# Patient Record
Sex: Male | Born: 1985 | Race: Black or African American | Hispanic: No | Marital: Single | Smoking: Current some day smoker
Health system: Southern US, Community
[De-identification: ages and names within clinical notes are randomized; demographics above are authoritative.]

## PROBLEM LIST (undated history)

## (undated) DIAGNOSIS — F209 Schizophrenia, unspecified: Secondary | ICD-10-CM

## (undated) HISTORY — PX: NO PAST SURGERIES: SHX2092

---

## 2018-10-13 ENCOUNTER — Emergency Department
Admission: EM | Admit: 2018-10-13 | Discharge: 2018-10-13 | Disposition: A | Discharge status: Home / Self Care | Payer: Self-pay | Attending: Emergency Medicine | Admitting: Emergency Medicine

## 2018-10-13 ENCOUNTER — Other Ambulatory Visit: Payer: Self-pay

## 2018-10-13 DIAGNOSIS — F17210 Nicotine dependence, cigarettes, uncomplicated: Secondary | ICD-10-CM | POA: Insufficient documentation

## 2018-10-13 DIAGNOSIS — Y929 Unspecified place or not applicable: Secondary | ICD-10-CM | POA: Insufficient documentation

## 2018-10-13 DIAGNOSIS — Y93H2 Activity, gardening and landscaping: Secondary | ICD-10-CM | POA: Insufficient documentation

## 2018-10-13 DIAGNOSIS — D171 Benign lipomatous neoplasm of skin and subcutaneous tissue of trunk: Secondary | ICD-10-CM | POA: Insufficient documentation

## 2018-10-13 DIAGNOSIS — Y999 Unspecified external cause status: Secondary | ICD-10-CM | POA: Insufficient documentation

## 2018-10-13 DIAGNOSIS — X503XXA Overexertion from repetitive movements, initial encounter: Secondary | ICD-10-CM | POA: Insufficient documentation

## 2018-10-13 DIAGNOSIS — S239XXA Sprain of unspecified parts of thorax, initial encounter: Secondary | ICD-10-CM | POA: Insufficient documentation

## 2018-10-13 MED ORDER — NAPROXEN 500 MG PO TABS: 500.0000 mg | ORAL_TABLET | Freq: Two times a day (BID) | ORAL | 0 refills | Status: AC

## 2018-10-13 MED ORDER — CYCLOBENZAPRINE HCL 5 MG PO TABS: 5.0000 mg | ORAL_TABLET | Freq: Three times a day (TID) | ORAL | 0 refills | Status: DC | PRN

## 2018-10-13 MED ORDER — LIDOCAINE 5 % EX PTCH: 1.0000 | MEDICATED_PATCH | CUTANEOUS | 0 refills | Status: AC

## 2018-10-13 NOTE — ED Triage Notes (Signed)
Pt c/o mid back pain for the past couple of days, states he thinks he strained it while cutting weeds for his mother.

## 2018-10-13 NOTE — ED Notes (Signed)
See triage note  Presents with min back pain  States he was working in the yard on Sunday   And developed pain

## 2018-10-13 NOTE — ED Provider Notes (Signed)
[facility] Emergency Department Provider Note ____________________________________________  Time seen: 1646  I have reviewed the triage vital signs and the nursing notes.  HISTORY  Chief Complaint  Back Pain  HPI Ethan Sutton is a 33 y.o. male presents to the ED for evaluation of mid back pain.   Patient reports onset a couple days earlier, and reports he may have strained it while cutting weeds.  He admits that he was swinging a weedeater when the pain started suddenly. He localizes the pain to the right midback region. Pain is worsened by movement of the arms. He denies any chest pain, shortness of breath, diaphoresis, or syncope.  He has also had no nausea, vomiting, or diarrhea.  History reviewed. No pertinent past medical history.  There are no active problems to display for this patient.  History reviewed. No pertinent surgical history.  Prior to Admission medications   Medication Sig Start Date End Date Taking? Authorizing Provider  cyclobenzaprine (FLEXERIL) 5 MG tablet Take 1 tablet (5 mg total) by mouth 3 (three) times daily as needed. 10/13/18   Analis Distler, Dannielle Karvonen, PA-C  lidocaine (LIDODERM) 5 % Place 1 patch onto the skin daily for 5 days. Remove & Discard patch after 12 hours of wear each day. 10/13/18 10/18/18  Kenneshia Rehm, Dannielle Karvonen, PA-C  naproxen (NAPROSYN) 500 MG tablet Take 1 tablet (500 mg total) by mouth 2 (two) times daily with a meal for 15 days. 10/13/18 10/28/18  Marieelena Bartko, Dannielle Karvonen, PA-C   Allergies Patient has no known allergies.  No family history on file.  Social History Social History   Tobacco Use  . Smoking status: Current Every Day Smoker    Types: Cigarettes  . Smokeless tobacco: Never Used  Substance Use Topics  . Alcohol use: Not Currently  . Drug use: Not Currently    Review of Systems  Constitutional: Negative for fever. Eyes: Negative for visual changes. ENT: Negative for sore throat. Cardiovascular:  Negative for chest pain. Respiratory: Negative for shortness of breath. Gastrointestinal: Negative for abdominal pain, vomiting and diarrhea. Genitourinary: Negative for dysuria. Musculoskeletal: Positive for mid back pain. Skin: Negative for rash. Neurological: Negative for headaches, focal weakness or numbness. ____________________________________________  PHYSICAL EXAM:  VITAL SIGNS: ED Triage Vitals  Enc Vitals Group     BP 10/13/18 1553 118/81     Pulse Rate 10/13/18 1553 78     Resp 10/13/18 1553 16     Temp 10/13/18 1553 98.7 F (37.1 C)     Temp src --      SpO2 10/13/18 1553 99 %     Weight 10/13/18 1554 180 lb (81.6 kg)     Height 10/13/18 1554 6' (1.829 m)     Head Circumference --      Peak Flow --      Pain Score 10/13/18 1554 8     Pain Loc --      Pain Edu? --      Excl. in [place]? --    Constitutional: Alert and oriented. Well appearing and in no distress. Head: Normocephalic and atraumatic. Eyes: Conjunctivae are normal. Normal extraocular movements Neck: Supple. Normal ROM Cardiovascular: Normal rate, regular rhythm. Normal distal pulses. Respiratory: Normal respiratory effort. No wheezes/rales/rhonchi. Gastrointestinal: Soft and nontender. No distention. Musculoskeletal: Normal spinal alignment without midline tenderness, spasm, deformity, or step-off.  Normal flexion extension range of the spine noted.  Patient with a large, mobile, smooth, rubbery subcutaneous, soft tissue mass; most consistent with  a stable lipoma; to the right thoracic region overlying the area of concern. Nontender with normal range of motion in all extremities.  Neurologic:  Normal gait without ataxia. Normal speech and language. No gross focal neurologic deficits are appreciated. Skin:  Skin is warm, dry and intact. No rash noted. No indication of focal cellulitis or infected lipoma/cyst.  Psychiatric: Mood and affect are normal. Patient exhibits appropriate insight and  judgment. ____________________________________________  PROCEDURES  Procedures ____________________________________________  INITIAL IMPRESSION / ASSESSMENT AND PLAN / ED COURSE  Patient with ED evaluation of several days of right-sided mid back pain.  Patient reports onset after mechanical injury.  His exam is overall benign reassuring at this time.  Notification of any acute intracranial process.  This likely represent a musculoskeletal sprain.  Patient will be treated empirically with anti-inflammatories and muscle relaxants.  He is also given a prescription for topical pain patches.  Follow with primary provider or one of the local community clinics.  Janet Szwed was evaluated in Emergency Department on 10/13/2018 for the symptoms described in the history of present illness. He was evaluated in the context of the global COVID-19 pandemic, which necessitated consideration that the patient might be at risk for infection with the SARS-CoV-2 virus that causes COVID-19. Institutional protocols and algorithms that pertain to the evaluation of patients at risk for COVID-19 are in a state of rapid change based on information released by regulatory bodies including the CDC and federal and state organizations. These policies and algorithms were followed during the patient's care in the ED. ____________________________________________  FINAL CLINICAL IMPRESSION(S) / ED DIAGNOSES  Final diagnoses:  Thoracic back sprain, initial encounter  Lipoma of back      Melvenia Needles, PA-C 10/13/18 1747    Vanessa [place], MD 10/15/18 [phone]

## 2018-10-13 NOTE — Discharge Instructions (Signed)
Your exam is consistent with a thoracic (midback) muscle strain. Take the prescription meds as directed. Apply ice or moist heat as needed. Use the Lidoderm patch as directed. Follow-up with [facility] or a local community clinic for further treatment.

## 2019-04-30 ENCOUNTER — Emergency Department: Payer: Self-pay

## 2019-04-30 ENCOUNTER — Emergency Department
Admission: EM | Admit: 2019-04-30 | Discharge: 2019-05-01 | Disposition: A | Discharge status: Home / Self Care | Payer: Self-pay | Attending: Emergency Medicine | Admitting: Emergency Medicine

## 2019-04-30 DIAGNOSIS — R000 Tachycardia, unspecified: Secondary | ICD-10-CM | POA: Insufficient documentation

## 2019-04-30 DIAGNOSIS — K219 Gastro-esophageal reflux disease without esophagitis: Secondary | ICD-10-CM | POA: Insufficient documentation

## 2019-04-30 DIAGNOSIS — R0789 Other chest pain: Secondary | ICD-10-CM | POA: Insufficient documentation

## 2019-04-30 DIAGNOSIS — F17210 Nicotine dependence, cigarettes, uncomplicated: Secondary | ICD-10-CM | POA: Insufficient documentation

## 2019-04-30 NOTE — ED Notes (Signed)
Patient refusing blood work at this time; Reports fear of needles.

## 2019-04-30 NOTE — ED Triage Notes (Signed)
Patient c/o generalized chest pain for 3-5 minutes that has since resolved. Patient denies other symptoms. Patient reports pain was sharp/standing.

## 2019-04-30 NOTE — ED Notes (Signed)
Pt reports history of intermittent chest pain since high school, which he has never been evaluated for. Pt reports the chest pain episode today was worse than normal. Pt reports flare ups are more likely to happen when he's exercising.   Pt reports he has been taking "a lot of ibuprofen" lately, and that it is upsetting his stomach now.   Pt reports his heart rate has been consistently over 100bpm for the month

## 2019-04-30 NOTE — ED Notes (Signed)
Pt has area of swelling to posterior ribs on the right side, approx the size of a fist

## 2019-04-30 NOTE — ED Notes (Signed)
Pt reports no alcohol or drug use. Current smoker

## 2019-04-30 NOTE — ED Triage Notes (Signed)
First RN note: pt arrives via ems. Per ems pt complains of central chest pain for 3 minutes intermittently during the day. Per ems pt also has a mass noted to right posterior rib area. Sinus tachycardia noted on ekg from ems. Vitals 144/85, 99% ra, 103 hr, fsbs 165.

## 2019-05-01 LAB — BASIC METABOLIC PANEL
Anion gap: 11 (ref 5–15)
BUN: 16 mg/dL (ref 6–20)
CO2: 28 mmol/L (ref 22–32)
Calcium: 9.4 mg/dL (ref 8.9–10.3)
Chloride: 101 mmol/L (ref 98–111)
Creatinine, Ser: 0.78 mg/dL (ref 0.61–1.24)
GFR calc Af Amer: >60 mL/min (ref >60)
GFR calc non Af Amer: >60 mL/min (ref >60)
Glucose, Bld: 120 mg/dL — ABNORMAL HIGH (ref 70–99)
Potassium: 3.6 mmol/L (ref 3.5–5.1)
Sodium: 140 mmol/L (ref 135–145)

## 2019-05-01 LAB — CBC
HCT: 47.6 % (ref 39.0–52.0)
Hemoglobin: 16.4 g/dL (ref 13.0–17.0)
MCH: 30.8 pg (ref 26.0–34.0)
MCHC: 34.5 g/dL (ref 30.0–36.0)
MCV: 89.3 fL (ref 80.0–100.0)
Platelets: 340 10*3/uL (ref 150–400)
RBC: 5.33 MIL/uL (ref 4.22–5.81)
RDW: 12.7 % (ref 11.5–15.5)
WBC: 11 10*3/uL — ABNORMAL HIGH (ref 4.0–10.5)
nRBC: 0 % (ref 0.0–0.2)

## 2019-05-01 LAB — FIBRIN DERIVATIVES D-DIMER (ARMC ONLY): Fibrin derivatives D-dimer (ARMC): 156.38 ng/mL (FEU) (ref 0.00–499.00)

## 2019-05-01 LAB — TROPONIN I (HIGH SENSITIVITY): Troponin I (High Sensitivity): 6 ng/L (ref <18)

## 2019-05-01 MED ORDER — DIPHENHYDRAMINE HCL 50 MG/ML IJ SOLN
25.0000 mg | Freq: Once | INTRAMUSCULAR | Status: AC
  Administered 2019-05-01: 25 mg via INTRAVENOUS
  Filled 2019-05-01: qty 1

## 2019-05-01 MED ORDER — FAMOTIDINE 20 MG PO TABS: 20.0000 mg | ORAL_TABLET | Freq: Two times a day (BID) | ORAL | 0 refills | Status: DC

## 2019-05-01 MED ORDER — SODIUM CHLORIDE 0.9 % IV BOLUS
500.0000 mL | Freq: Once | INTRAVENOUS | Status: AC
  Administered 2019-05-01: 01:00:00 500 mL via INTRAVENOUS

## 2019-05-01 MED ORDER — ALUMINUM-MAGNESIUM-SIMETHICONE 200-200-20 MG/5ML PO SUSP: 30.0000 mL | Freq: Three times a day (TID) | ORAL | 0 refills | Status: DC

## 2019-05-01 MED ORDER — METOCLOPRAMIDE HCL 5 MG/ML IJ SOLN
10.0000 mg | Freq: Once | INTRAMUSCULAR | Status: AC
  Administered 2019-05-01: 01:00:00 10 mg via INTRAVENOUS
  Filled 2019-05-01: qty 2

## 2019-05-01 NOTE — ED Provider Notes (Signed)
[facility] Emergency Department Provider Note  ____________________________________________  Time seen: Approximately 4:22 AM  I have reviewed the triage vital signs and the nursing notes.   HISTORY  Chief Complaint Chest Pain    HPI Ethan Sutton is a 34 y.o. male with no significant past medical history who comes the ED complaining of left-sided chest pain that started in the afternoon sometime of April 2.  Lasted about 3 minutes, resolved spontaneously.  Nonradiating, no associated shortness of breath diaphoresis vomiting.  Not pleuritic, not exertional.  No dizziness or syncope.  Currently pain-free.  Patient also reports having fast heart rate ongoing for many years.  Not related to exertion, denies any history of syncope or other exertional symptoms.     History reviewed. No pertinent past medical history.   There are no problems to display for this patient.    History reviewed. No pertinent surgical history.   Prior to Admission medications   Medication Sig Start Date End Date Taking? Authorizing Provider  aluminum-magnesium hydroxide-simethicone (MAALOX) [id number] MG/5ML SUSP Take 30 mLs by mouth 4 (four) times daily -  before meals and at bedtime. 05/01/19   Carrie Mew, MD  cyclobenzaprine (FLEXERIL) 5 MG tablet Take 1 tablet (5 mg total) by mouth 3 (three) times daily as needed. 10/13/18   Menshew, Dannielle Karvonen, PA-C  famotidine (PEPCID) 20 MG tablet Take 1 tablet (20 mg total) by mouth 2 (two) times daily. 05/01/19   Carrie Mew, MD     Allergies Patient has no known allergies.   No family history on file.  Social History Social History   Tobacco Use  . Smoking status: Current Every Day Smoker    Types: Cigarettes  . Smokeless tobacco: Never Used  Substance Use Topics  . Alcohol use: Not Currently  . Drug use: Not Currently    Review of Systems  Constitutional:   No fever or chills.  ENT:   No sore throat. No  rhinorrhea. Cardiovascular:   Positive chest pain as above without syncope. Respiratory:   No dyspnea or cough. Gastrointestinal:   Negative for abdominal pain, vomiting and diarrhea.  Musculoskeletal:   Negative for focal pain or swelling All other systems reviewed and are negative except as documented above in ROS and HPI.  ____________________________________________   PHYSICAL EXAM:  VITAL SIGNS: ED Triage Vitals  Enc Vitals Group     BP 04/30/19 2229 (!) 146/97     Pulse Rate 04/30/19 2229 (!) 114     Resp 04/30/19 2229 18     Temp 04/30/19 2229 98.2 F (36.8 C)     Temp src --      SpO2 04/30/19 2229 99 %     Weight 04/30/19 2230 170 lb (77.1 kg)     Height 04/30/19 2230 5\' 11"  (1.803 m)     Head Circumference --      Peak Flow --      Pain Score 04/30/19 2230 0     Pain Loc --      Pain Edu? --      Excl. in [place]? --     Vital signs reviewed, nursing assessments reviewed.   Constitutional:   Alert and oriented. Non-toxic appearance. Eyes:   Conjunctivae are normal. EOMI. PERRL. ENT      Head:   Normocephalic and atraumatic.      Nose:   Wearing a mask.      Mouth/Throat:   Wearing a mask.  Neck:   No meningismus. Full ROM. Hematological/Lymphatic/Immunilogical:   No cervical lymphadenopathy. Cardiovascular:   Tachycardia heart rate 115. Symmetric bilateral radial and DP pulses.  No murmurs. Cap refill less than 2 seconds. Respiratory:   Normal respiratory effort without tachypnea/retractions. Breath sounds are clear and equal bilaterally. No wheezes/rales/rhonchi. Gastrointestinal:   Soft with mild left upper quadrant tenderness. Non distended. There is no CVA tenderness.  No rebound, rigidity, or guarding.  Musculoskeletal:   Normal range of motion in all extremities. No joint effusions.  No lower extremity tenderness.  No edema. Neurologic:   Normal speech and language.  Motor grossly intact. No acute focal neurologic deficits are appreciated.  Skin:     Skin is warm, dry and intact. No rash noted.  No petechiae, purpura, or bullae.  ____________________________________________    LABS (pertinent positives/negatives) (all labs ordered are listed, but only abnormal results are displayed) Labs Reviewed  BASIC METABOLIC PANEL - Abnormal; Notable for the following components:      Result Value   Glucose, Bld 120 (*)    All other components within normal limits  CBC - Abnormal; Notable for the following components:   WBC 11.0 (*)    All other components within normal limits  FIBRIN DERIVATIVES D-DIMER ([facility] ONLY)  TROPONIN I (HIGH SENSITIVITY)   ____________________________________________   EKG  Interpreted by me Sinus tachycardia rate 131.  Right axis, normal intervals.  Large QRS amplitude in anterior leads. Nl ST segments and T waves.no ischemic changes  ____________________________________________    RADIOLOGY  DG Chest 2 View  Result Date: 04/30/2019 CLINICAL DATA:  Chest pain EXAM: CHEST - 2 VIEW COMPARISON:  None. FINDINGS: The heart size and mediastinal contours are within normal limits. Both lungs are clear. The visualized skeletal structures are unremarkable. IMPRESSION: No active cardiopulmonary disease. Electronically Signed   By: Donavan Foil M.D.   On: 04/30/2019 22:47    ____________________________________________   PROCEDURES .1-3 Lead EKG Interpretation Performed by: Carrie Mew, MD Authorized by: Carrie Mew, MD     Interpretation: normal     ECG rate:  90   ECG rate assessment: normal     Rhythm: sinus rhythm     Ectopy: none     Conduction: normal      ____________________________________________    CLINICAL IMPRESSION / ASSESSMENT AND PLAN / ED COURSE  Medications ordered in the ED: Medications  sodium chloride 0.9 % bolus 500 mL (0 mLs Intravenous Stopped 05/01/19 0219)  metoCLOPramide (REGLAN) injection 10 mg (10 mg Intravenous Given 05/01/19 0033)  diphenhydrAMINE (BENADRYL)  injection 25 mg (25 mg Intravenous Given 05/01/19 0033)    Pertinent labs & imaging results that were available during my care of the patient were reviewed by me and considered in my medical decision making (see chart for details).  Ethan Sutton was evaluated in Emergency Department on 05/01/2019 for the symptoms described in the history of present illness. He was evaluated in the context of the global COVID-19 pandemic, which necessitated consideration that the patient might be at risk for infection with the SARS-CoV-2 virus that causes COVID-19. Institutional protocols and algorithms that pertain to the evaluation of patients at risk for COVID-19 are in a state of rapid change based on information released by regulatory bodies including the CDC and federal and state organizations. These policies and algorithms were followed during the patient's care in the ED.   Patient presents with atypical chest pain. Considering the patient's symptoms, medical history, and physical examination today,  I have low suspicion for ACS, PE, TAD, pneumothorax, carditis, mediastinitis, pneumonia, CHF, or sepsis.  Exam most consistent with GERD/gastritis.  I will give antacids.  Patient somewhat unclear with history and duration of symptoms, so D-dimer obtained which is low.  Symptoms are noncardiac, no cardiac work-up is indicated.  Troponin was ordered by triage protocol but does not need to be repeated.   Due to patient's reported history of longstanding tachycardia, I have recommended he follow-up with cardiology within the next week for further assessment.  ----------------------------------------- 4:33 AM on 05/01/2019 -----------------------------------------  Heart rate improved to 90 on monitor / rhythm strip after small IV fluid challenge.      ____________________________________________   FINAL CLINICAL IMPRESSION(S) / ED DIAGNOSES    Final diagnoses:  Atypical chest pain  Gastroesophageal reflux  disease without esophagitis  Sinus tachycardia     ED Discharge Orders         Ordered    famotidine (PEPCID) 20 MG tablet  2 times daily     05/01/19 0420    aluminum-magnesium hydroxide-simethicone (MAALOX) 200-200-20 MG/5ML SUSP  3 times daily before meals & bedtime     05/01/19 0420          Portions of this note were generated with dragon dictation software. Dictation errors may occur despite best attempts at proofreading.   Carrie Mew, MD 05/01/19 [phone]

## 2019-05-01 NOTE — Discharge Instructions (Signed)
Your chest x-ray, EKG, and lab tests were all okay today.  You should take famotidine and Maalox to help with your chest pain symptoms.  You should follow-up with cardiology as we discussed due to your longstanding history of rapid heart rate.  Please drink lots of fluids to stay well-hydrated.  Return to the ER for repeat assessment if you have new or worsening symptoms.

## 2019-05-13 ENCOUNTER — Emergency Department
Admission: EM | Admit: 2019-05-13 | Discharge: 2019-05-15 | Disposition: A | Discharge status: Home / Self Care | Payer: Self-pay | Attending: Emergency Medicine | Admitting: Emergency Medicine

## 2019-05-13 ENCOUNTER — Encounter: Payer: Self-pay | Admitting: Emergency Medicine

## 2019-05-13 ENCOUNTER — Other Ambulatory Visit: Payer: Self-pay

## 2019-05-13 DIAGNOSIS — Y999 Unspecified external cause status: Secondary | ICD-10-CM | POA: Insufficient documentation

## 2019-05-13 DIAGNOSIS — Z20822 Contact with and (suspected) exposure to covid-19: Secondary | ICD-10-CM | POA: Insufficient documentation

## 2019-05-13 DIAGNOSIS — Y92238 Other place in hospital as the place of occurrence of the external cause: Secondary | ICD-10-CM | POA: Insufficient documentation

## 2019-05-13 DIAGNOSIS — Y9302 Activity, running: Secondary | ICD-10-CM | POA: Insufficient documentation

## 2019-05-13 DIAGNOSIS — F209 Schizophrenia, unspecified: Secondary | ICD-10-CM | POA: Diagnosis present

## 2019-05-13 DIAGNOSIS — F17210 Nicotine dependence, cigarettes, uncomplicated: Secondary | ICD-10-CM | POA: Insufficient documentation

## 2019-05-13 DIAGNOSIS — F23 Brief psychotic disorder: Secondary | ICD-10-CM

## 2019-05-13 DIAGNOSIS — W2209XA Striking against other stationary object, initial encounter: Secondary | ICD-10-CM | POA: Insufficient documentation

## 2019-05-13 DIAGNOSIS — F29 Unspecified psychosis not due to a substance or known physiological condition: Secondary | ICD-10-CM | POA: Insufficient documentation

## 2019-05-13 DIAGNOSIS — S01111A Laceration without foreign body of right eyelid and periocular area, initial encounter: Secondary | ICD-10-CM | POA: Insufficient documentation

## 2019-05-13 LAB — COMPREHENSIVE METABOLIC PANEL
ALT: 24 U/L (ref 0–44)
AST: 19 U/L (ref 15–41)
Albumin: 4.9 g/dL (ref 3.5–5.0)
Alkaline Phosphatase: 83 U/L (ref 38–126)
Anion gap: 13 (ref 5–15)
BUN: 12 mg/dL (ref 6–20)
CO2: 23 mmol/L (ref 22–32)
Calcium: 9.3 mg/dL (ref 8.9–10.3)
Chloride: 102 mmol/L (ref 98–111)
Creatinine, Ser: 0.72 mg/dL (ref 0.61–1.24)
GFR calc Af Amer: >60 mL/min (ref >60)
GFR calc non Af Amer: >60 mL/min (ref >60)
Glucose, Bld: 126 mg/dL — ABNORMAL HIGH (ref 70–99)
Potassium: 3.5 mmol/L (ref 3.5–5.1)
Sodium: 138 mmol/L (ref 135–145)
Total Bilirubin: 0.8 mg/dL (ref 0.3–1.2)
Total Protein: 8.3 g/dL — ABNORMAL HIGH (ref 6.5–8.1)

## 2019-05-13 LAB — URINE DRUG SCREEN, QUALITATIVE (ARMC ONLY)
Amphetamines, Ur Screen: NOT DETECTED
Barbiturates, Ur Screen: NOT DETECTED
Benzodiazepine, Ur Scrn: NOT DETECTED
Cannabinoid 50 Ng, Ur ~~LOC~~: NOT DETECTED
Cocaine Metabolite,Ur ~~LOC~~: NOT DETECTED
MDMA (Ecstasy)Ur Screen: NOT DETECTED
Methadone Scn, Ur: NOT DETECTED
Opiate, Ur Screen: NOT DETECTED
Phencyclidine (PCP) Ur S: NOT DETECTED
Tricyclic, Ur Screen: NOT DETECTED

## 2019-05-13 LAB — SALICYLATE LEVEL: Salicylate Lvl: <7 mg/dL — ABNORMAL LOW (ref 7.0–30.0)

## 2019-05-13 LAB — CBC
HCT: 45.5 % (ref 39.0–52.0)
Hemoglobin: 15.5 g/dL (ref 13.0–17.0)
MCH: 30.8 pg (ref 26.0–34.0)
MCHC: 34.1 g/dL (ref 30.0–36.0)
MCV: 90.5 fL (ref 80.0–100.0)
Platelets: 359 10*3/uL (ref 150–400)
RBC: 5.03 MIL/uL (ref 4.22–5.81)
RDW: 12.8 % (ref 11.5–15.5)
WBC: 11.2 10*3/uL — ABNORMAL HIGH (ref 4.0–10.5)
nRBC: 0 % (ref 0.0–0.2)

## 2019-05-13 LAB — ACETAMINOPHEN LEVEL: Acetaminophen (Tylenol), Serum: <10 ug/mL — ABNORMAL LOW (ref 10–30)

## 2019-05-13 LAB — ETHANOL: Alcohol, Ethyl (B): <10 mg/dL (ref <10)

## 2019-05-13 MED ORDER — DIPHENHYDRAMINE HCL 50 MG/ML IJ SOLN
50.0000 mg | Freq: Once | INTRAMUSCULAR | Status: AC
  Administered 2019-05-13: 50 mg via INTRAMUSCULAR
  Filled 2019-05-13: qty 1

## 2019-05-13 MED ORDER — LORAZEPAM 2 MG/ML IJ SOLN
2.0000 mg | Freq: Once | INTRAMUSCULAR | Status: AC
  Administered 2019-05-13: 2 mg via INTRAMUSCULAR
  Filled 2019-05-13: qty 1

## 2019-05-13 MED ORDER — HALOPERIDOL LACTATE 5 MG/ML IJ SOLN
5.0000 mg | Freq: Once | INTRAMUSCULAR | Status: AC
  Administered 2019-05-13: 5 mg via INTRAMUSCULAR
  Filled 2019-05-13: qty 1

## 2019-05-13 MED ORDER — LIDOCAINE HCL (PF) 1 % IJ SOLN
5.0000 mL | Freq: Once | INTRAMUSCULAR | Status: AC
  Administered 2019-05-13: 5 mL
  Filled 2019-05-13: qty 5

## 2019-05-13 MED ORDER — ONDANSETRON 4 MG PO TBDP
4.0000 mg | ORAL_TABLET | Freq: Once | ORAL | Status: AC
  Administered 2019-05-13: 4 mg via ORAL
  Filled 2019-05-13: qty 1
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
## 2019-05-13 NOTE — ED Notes (Signed)
Patient sleeping
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
## 2019-05-13 NOTE — ED Notes (Addendum)
Pt. transfered to [facility] without incident after report from Douglass Rivers, Therapist, sports. Placed in room 1 and oriented to unit. Pt. informed that for their safety all care areas are designed for safety and monitored by security cameras at all times; and visiting hours explained to patient. Patient verbalizes understanding, and verbal contract for safety obtained. Stitches in place to rt eyebrow--no bleeding noted; pt given juice & sandwich tray

## 2019-05-13 NOTE — ED Notes (Signed)
BEHAVIORAL HEALTH ROUNDING Patient sleeping: YES Patient alert and oriented: SLEEPING Behavior appropriate: SLEEPING Nutrition and fluids offered: SLEEPING Toileting and hygiene offered: SLEEPING Sitter present: NO Law enforcement present: YES
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
## 2019-05-13 NOTE — ED Provider Notes (Signed)
[facility] Emergency Department Provider Note  ____________________________________________  Time seen: Approximately 1:38 PM  I have reviewed the triage vital signs and the nursing notes.   HISTORY  Chief Complaint Psychiatric Evaluation    Level 5 Caveat: Portions of the History and Physical including HPI and review of systems are unable to be completely obtained due to patient being a poor historian   HPI Ethan Sutton is a 34 y.o. male brought to the ED by mother who states that the patient has been having apparent hallucinations, paranoia, for the summer is trying to stab him.  Frequently jumping and moving vehicles.  Bizarre behavior, living in a shed.  Mom reports a family history of schizophrenia.  Unsure if patient uses any drugs.  Patient denies any symptoms, states that he just does not feel well, request to go outside to smoke.      History reviewed. No pertinent past medical history.   There are no problems to display for this patient.    History reviewed. No pertinent surgical history.   Prior to Admission medications   Medication Sig Start Date End Date Taking? Authorizing Provider  aluminum-magnesium hydroxide-simethicone (MAALOX) [id number] MG/5ML SUSP Take 30 mLs by mouth 4 (four) times daily -  before meals and at bedtime. 05/01/19   Carrie Mew, MD  cyclobenzaprine (FLEXERIL) 5 MG tablet Take 1 tablet (5 mg total) by mouth 3 (three) times daily as needed. 10/13/18   Menshew, Dannielle Karvonen, PA-C  famotidine (PEPCID) 20 MG tablet Take 1 tablet (20 mg total) by mouth 2 (two) times daily. 05/01/19   Carrie Mew, MD     Allergies Patient has no known allergies.   No family history on file.  Social History Social History   Tobacco Use  . Smoking status: Current Every Day Smoker    Types: Cigarettes  . Smokeless tobacco: Never Used  Substance Use Topics  . Alcohol use: Not Currently  . Drug use: Not Currently     Review of Systems Level 5 Caveat: Portions of the History and Physical including HPI and review of systems are unable to be completely obtained due to patient being a poor historian   Constitutional:   No known fever.  ENT:   No rhinorrhea. Cardiovascular:   No chest pain or syncope. Respiratory:   No dyspnea or cough. Gastrointestinal:   Negative for abdominal pain, vomiting and diarrhea.  Musculoskeletal:   Negative for focal pain or swelling ____________________________________________   PHYSICAL EXAM:  VITAL SIGNS: ED Triage Vitals [05/13/19 1243]  Enc Vitals Group     BP      Pulse      Resp      Temp      Temp src      SpO2      Weight      Height      Head Circumference      Peak Flow      Pain Score 0     Pain Loc      Pain Edu?      Excl. in [place]?     Vital signs reviewed, nursing assessments reviewed.   Constitutional:   Alert and oriented. Non-toxic appearance. Eyes:   Conjunctivae are normal. EOMI.  ENT      Head:   Normocephalic and atraumatic.      Nose:   No congestion/rhinnorhea.       Mouth/Throat:   MMM, no pharyngeal erythema. No peritonsillar mass.  Neck:   No meningismus. Full ROM. Hematological/Lymphatic/Immunilogical:   No cervical lymphadenopathy. Cardiovascular:   RRR. Symmetric bilateral radial and DP pulses.  No murmurs. Cap refill less than 2 seconds. Respiratory:   Normal respiratory effort without tachypnea/retractions. Breath sounds are clear and equal bilaterally. No wheezes/rales/rhonchi. Gastrointestinal:   Soft and nontender. Non distended. There is no CVA tenderness.  No rebound, rigidity, or guarding.  Musculoskeletal:   Normal range of motion in all extremities. No joint effusions.  No lower extremity tenderness.  No edema. Neurologic:   Normal speech and language.  Motor grossly intact. No acute focal neurologic deficits are appreciated.  Skin:    Skin is warm, dry and intact. No rash noted.  No petechiae, purpura,  or bullae.  No wounds  ____________________________________________    LABS (pertinent positives/negatives) (all labs ordered are listed, but only abnormal results are displayed) Labs Reviewed  COMPREHENSIVE METABOLIC PANEL  ETHANOL  SALICYLATE LEVEL  ACETAMINOPHEN LEVEL  CBC  URINE DRUG SCREEN, QUALITATIVE ([place])   ____________________________________________   EKG    ____________________________________________    RADIOLOGY  No results found.  ____________________________________________   PROCEDURES Procedures  ____________________________________________    CLINICAL IMPRESSION / ASSESSMENT AND PLAN / ED COURSE  Medications ordered in the ED: Medications - No data to display  Pertinent labs & imaging results that were available during my care of the patient were reviewed by me and considered in my medical decision making (see chart for details).   Ethan Sutton was evaluated in Emergency Department on 05/13/2019 for the symptoms described in the history of present illness. He was evaluated in the context of the global COVID-19 pandemic, which necessitated consideration that the patient might be at risk for infection with the SARS-CoV-2 virus that causes COVID-19. Institutional protocols and algorithms that pertain to the evaluation of patients at risk for COVID-19 are in a state of rapid change based on information released by regulatory bodies including the CDC and federal and state organizations. These policies and algorithms were followed during the patient's care in the ED.   Patient presents with acute psychosis.  Will check labs, UDS, consult psychiatry.  Patient placed under IVC for safety pending additional work-up.  Will give Ativan and Haldol as needed.      ____________________________________________   FINAL CLINICAL IMPRESSION(S) / ED DIAGNOSES    Final diagnoses:  Acute psychosis [facility])     ED Discharge Orders    None       Portions of this note were generated with dragon dictation software. Dictation errors may occur despite best attempts at proofreading.   Carrie Mew, MD 05/13/19 1340

## 2019-05-13 NOTE — ED Notes (Signed)
MD Archie Balboa at bedside for suture placement. Pt calm and cooperative at this time.

## 2019-05-13 NOTE — ED Notes (Signed)
Assumed care of patient, patient self reports that he has hx of schizophrenia. Report can not remmeber the last time he took meds for his condtion. Patient states lives with his mother from time to time but for the most part states where ever he can. Patient speaks in a soft tone, polite and cooperative. Denies SI/HI. Reports hears voices but they are not command voices. He just hears people talking all the time. 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 When asked how often he does heroin he reports everyday for a long long time. When patient was questioned about what brought him to ed today, patient reports his mother brought him because he went to her to ask for help. He wants to stop doing drugs and start back up on his medications. Labs and urine sent. Awaiting medical clearance and psych eval.

## 2019-05-13 NOTE — ED Notes (Signed)
Patient is not agressive, but continues to refuse to dress out or get blood drawn.

## 2019-05-13 NOTE — BH Assessment (Signed)
Assessment Note  Ethan Sutton is an 34 y.o. male Who presents to the ER via his mother, after he requested to do so. Per the patient, he is hearing voices and they have increased within the last two weeks. He further reports, he is feeling unsafe and doesn't know what to do to handle them. Per his report, he's currently homeless and lives with different family members.  Per the report of the patient's mother ([phone]-[phone]), the patient has a history of mental illness but it has gone untreated. In the past, "he would have his moments but he would pull through them." However, this time the patient has been extremely paranoid and responding to internal stimuli. On last night (05/12/2019), he stayed with her. The patient barricaded himself in the room. He had things on the door and window. Usually when he stays with her, he feels safe and doesn't display that many symptoms of psychosis but it wasn't the case last night. Mother voiced her concern about the patient and is ability to manage safely at this time.  Mother further reports, the patient and his wife have a history of substance use. As a result, they are now homeless and they have loss custody of their three children. It's unclear if it's temporary custody or permanent.   During the interview, the patient was cooperative and pleasant. He was able to provide appropriate answers to the questions. He denies SI/HI and reports of having AV/H. He admits to the use of multiple mind-altering substances but his UDS & BAC was negative from any substances. During the interview, the patient appeared to be in distress but it's unclear of it was due to psychosis or he was in pain.   Diagnosis: Psychosis  Past Medical History: History reviewed. No pertinent past medical history.  History reviewed. No pertinent surgical history.  Family History: No family history on file.  Social History:  reports that he has been smoking cigarettes. He has never used  smokeless tobacco. He reports previous alcohol use. He reports previous drug use.  Additional Social History:  Alcohol / Drug Use Pain Medications: See PTA Prescriptions: See PTA Over the Counter: See PTA History of alcohol / drug use?: Yes Longest period of sobriety (when/how long): Unable to quantify Substance #1 Name of Substance 1: "I do it all.Marland KitchenMarland KitchenWhatever I can get my hands on."  CIWA:   COWS:    Allergies: No Known Allergies  Home Medications: (Not in a hospital admission)  OB/GYN Status:  No LMP for male patient.  General Assessment Data Location of Assessment: [facility] ED TTS Assessment: In system Is this a Tele or Face-to-Face Assessment?: Face-to-Face Is this an Initial Assessment or a Re-assessment for this encounter?: Initial Assessment Patient Accompanied by:: Parent(Mother) Language Other than English: No Living Arrangements: Homeless/Shelter What gender do you identify as?: Male Marital status: Married Pregnancy Status: No Living Arrangements: Other (Comment)(Homeless) Can pt return to current living arrangement?: Yes Admission Status: Involuntary Petitioner: ED Attending Is patient capable of signing voluntary admission?: No(Under IVC) Referral Source: Self/Family/Friend Insurance type: None  Medical Screening Exam ([place]) Medical Exam completed: Yes  Crisis Care Plan Living Arrangements: Other (Comment)(Homeless) Name of Psychiatrist: Reports of none Name of Therapist: Reports of none  Education Status Is patient currently in school?: No Is the patient employed, unemployed or receiving disability?: Unemployed  Risk to self with the past 6 months Suicidal Ideation: No Has patient been a risk to self within the past 6 months prior to admission? :  No Suicidal Intent: No Has patient had any suicidal intent within the past 6 months prior to admission? : No Is patient at risk for suicide?: No Suicidal Plan?: No Has patient had any suicidal  plan within the past 6 months prior to admission? : No Access to Means: No What has been your use of drugs/alcohol within the last 12 months?: Reports "whatever I can get." Previous Attempts/Gestures: No Other Self Harm Risks: Self-reported drug use Triggers for Past Attempts: None known Intentional Self Injurious Behavior: None Family Suicide History: No Recent stressful life event(s): Loss (Comment), Other (Comment) Persecutory voices/beliefs?: Yes Depression: Yes Depression Symptoms: Insomnia, Isolating, Feeling worthless/self pity Substance abuse history and/or treatment for substance abuse?: Yes Suicide prevention information given to non-admitted patients: Not applicable  Risk to Others within the past 6 months Homicidal Ideation: No Does patient have any lifetime risk of violence toward others beyond the six months prior to admission? : No Thoughts of Harm to Others: No Current Homicidal Intent: No Current Homicidal Plan: No Access to Homicidal Means: No Identified Victim: Reports of none History of harm to others?: No Assessment of Violence: None Noted Violent Behavior Description: Reports of none Does patient have access to weapons?: No Criminal Charges Pending?: No Does patient have a court date: No Is patient on probation?: No  Psychosis Hallucinations: None noted Delusions: Jealous  Mental Status Report Appearance/Hygiene: Unremarkable, In scrubs Eye Contact: Good Motor Activity: Freedom of movement, Unremarkable Speech: Logical/coherent, Pressured Level of Consciousness: Alert Mood: Anxious, Helpless, Pleasant Affect: Anxious, Appropriate to circumstance Anxiety Level: Minimal Thought Processes: Coherent, Relevant Judgement: Partial Orientation: Person, Place, Time, Situation, Appropriate for developmental age Obsessive Compulsive Thoughts/Behaviors: Minimal  Cognitive Functioning Concentration: Normal Memory: Recent Intact, Remote Intact Is patient  IDD: No Insight: Fair Impulse Control: Fair Appetite: Good Have you had any weight changes? : No Change Sleep: Decreased Total Hours of Sleep: 3 Vegetative Symptoms: None  ADLScreening [facility] Assessment Services) Patient's cognitive ability adequate to safely complete daily activities?: Yes Patient able to express need for assistance with ADLs?: Yes Independently performs ADLs?: Yes (appropriate for developmental age)  Prior Inpatient Therapy Prior Inpatient Therapy: No  Prior Outpatient Therapy Prior Outpatient Therapy: No Does patient have an ACCT team?: No Does patient have Intensive In-House Services?  : No Does patient have [facility] services? : No Does patient have P4CC services?: No  ADL Screening (condition at time of admission) Patient's cognitive ability adequate to safely complete daily activities?: Yes Is the patient deaf or have difficulty hearing?: No Does the patient have difficulty seeing, even when wearing glasses/contacts?: No Does the patient have difficulty concentrating, remembering, or making decisions?: No Patient able to express need for assistance with ADLs?: Yes Does the patient have difficulty dressing or bathing?: No Independently performs ADLs?: Yes (appropriate for developmental age) Does the patient have difficulty walking or climbing stairs?: No Weakness of Legs: None Weakness of Arms/Hands: None  Home Assistive Devices/Equipment Home Assistive Devices/Equipment: None  Therapy Consults (therapy consults require a physician order) PT Evaluation Needed: No OT Evalulation Needed: No SLP Evaluation Needed: No Abuse/Neglect Assessment (Assessment to be complete while patient is alone) Abuse/Neglect Assessment Can Be Completed: Yes Physical Abuse: Denies Verbal Abuse: Denies Sexual Abuse: Denies Exploitation of patient/patient's resources: Denies Self-Neglect: Denies Values / Beliefs Cultural Requests During Hospitalization: None Spiritual  Requests During Hospitalization: None Consults Spiritual Care Consult Needed: No Transition of Care Team Consult Needed: No Advance Directives (For Healthcare) Does Patient Have a Medical Advance Directive?:  No Would patient like information on creating a medical advance directive?: No - Patient declined  Child/Adolescent Assessment Running Away Risk: Denies(Patient is an adult)  Disposition:  Disposition Initial Assessment Completed for this Encounter: Yes  On Site Evaluation by:   Reviewed with Physician:    Gunnar Fusi MS, LCAS, [facility], [place] Therapeutic Triage Specialist 05/13/2019 5:21 PM

## 2019-05-13 NOTE — ED Notes (Signed)
Patient currently sleeping

## 2019-05-14 DIAGNOSIS — F29 Unspecified psychosis not due to a substance or known physiological condition: Secondary | ICD-10-CM | POA: Diagnosis present

## 2019-05-14 DIAGNOSIS — F209 Schizophrenia, unspecified: Secondary | ICD-10-CM | POA: Diagnosis present

## 2019-05-14 LAB — RESPIRATORY PANEL BY RT PCR (FLU A&B, COVID)
Influenza A by PCR: NEGATIVE
Influenza B by PCR: NEGATIVE
SARS Coronavirus 2 by RT PCR: NEGATIVE

## 2019-05-14 MED ORDER — NICOTINE 21 MG/24HR TD PT24
21.0000 mg | MEDICATED_PATCH | Freq: Every day | TRANSDERMAL | Status: DC
  Administered 2019-05-14: 21 mg via TRANSDERMAL
  Filled 2019-05-14: qty 1

## 2019-05-14 MED ORDER — NICOTINE 21 MG/24HR TD PT24: 21.0000 mg | MEDICATED_PATCH | Freq: Every day | TRANSDERMAL | Status: DC

## 2019-05-14 MED ORDER — LORAZEPAM 1 MG PO TABS
1.0000 mg | ORAL_TABLET | ORAL | Status: DC | PRN
  Administered 2019-05-14: 1 mg via ORAL
  Filled 2019-05-14: qty 1

## 2019-05-14 NOTE — ED Notes (Signed)
BEHAVIORAL HEALTH ROUNDING Patient sleeping: YES Patient alert and oriented: SLEEPING Behavior appropriate: SLEEPING Nutrition and fluids offered: SLEEPING Toileting and hygiene offered: SLEEPING Sitter present: NO Law enforcement present: YES

## 2019-05-14 NOTE — ED Notes (Signed)
Hourly rounding reveals patient sleeping in room. No complaints, stable, in no acute distress. Q15 minute rounds and monitoring via Security Cameras to continue. 

## 2019-05-14 NOTE — ED Notes (Signed)
Hourly rounding reveals patient in day room. No complaints, stable, in no acute distress. Q15 minute rounds and monitoring via Security Cameras to continue. 

## 2019-05-14 NOTE — ED Notes (Signed)
Report to include Situation, Background, Assessment, and Recommendations received from Jadeka RN. Patient alert and oriented, warm and dry, in no acute distress. Patient denies SI, HI, AVH and pain. Patient made aware of Q15 minute rounds and security cameras for their safety. Patient instructed to come to me with needs or concerns. 

## 2019-05-14 NOTE — ED Provider Notes (Signed)
Emergency Medicine Observation Re-evaluation Note  Ethan Sutton is a 34 y.o. male, seen on rounds today.  Pt initially presented to the ED for complaints of Psychiatric Evaluation Currently, the patient is resting.  Physical Exam  BP 110/73 (BP Location: Left Arm)   Pulse 76   Temp 97.8 F (36.6 C) (Oral)   Resp 20   SpO2 99%    ED Course / MDM    I have reviewed the labs performed to date as well as medications administered while in observation. No recent changes in the last 24 hours include.  Plan   Current plan is for awaiting behavioral medicine and/or SW disposition. Patient is under full IVC at this time.   Lilia Pro., MD 05/14/19 [phone]

## 2019-05-14 NOTE — ED Notes (Signed)
Pt. Was giving a snack and drink

## 2019-05-14 NOTE — ED Notes (Signed)
Hourly rounding reveals patient awake in room. No complaints, stable, in no acute distress. Q15 minute rounds and monitoring via Security Cameras to continue. 

## 2019-05-14 NOTE — ED Notes (Signed)
Patient talking to TTS and psychiatry

## 2019-05-14 NOTE — Consult Note (Signed)
Ethan Sutton   Reason for Sutton: Psychosis Referring Physician:  ED MD Patient Identification: Ethan Sutton MRN:  [id number] Principal Diagnosis: <principal problem not specified> Diagnosis:  Active Problems:   Psychosis ([place])   Total Time spent with patient: 20 minutes  Subjective:   Ethan Sutton is a 34 y.o. male patient admitted with psychosis  HPI: per TTS Ethan Sutton is an 34 y.o. male Who presents to the ER via his mother, after he requested to do so. Per the patient, he is hearing voices and they have increased within the last two weeks. He further reports, he is feeling unsafe and doesn't know what to do to handle them. Per his report, he's currently homeless and lives with different family members.  Per the report of the patient's mother (Ethan Sutton), the patient has a history of mental illness but it has gone untreated. In the past, "he would have his moments but he would pull through them." However, this time the patient has been extremely paranoid and responding to internal stimuli. On last night (05/12/2019), he stayed with her. The patient barricaded himself in the room. He had things on the door and window. Usually when he stays with her, he feels safe and doesn't display that many symptoms of psychosis but it wasn't the case last night. Mother voiced her concern about the patient and is ability to manage safely at this time.  Mother further reports, the patient and his wife have a history of substance use. As a result, they are now homeless and they have loss custody of their three children. It's unclear if it's temporary custody or permanent.   During the interview, the patient was cooperative and pleasant. He was able to provide appropriate answers to the questions. He denies SI/HI and reports of having AV/H. He admits to the use of multiple mind-altering substances but his UDS & BAC was negative from any substances. During the interview,  the patient appeared to be in distress but it's unclear of it was due to psychosis or he was in pain.   Past Psychiatric History:   Risk to Self: Suicidal Ideation: No Suicidal Intent: No Is patient at risk for suicide?: No Suicidal Plan?: No Access to Means: No What has been your use of drugs/alcohol within the last 12 months?: Reports "whatever I can get." Other Self Harm Risks: Self-reported drug use Triggers for Past Attempts: None known Intentional Self Injurious Behavior: None Risk to Others: Homicidal Ideation: No Thoughts of Harm to Others: No Current Homicidal Intent: No Current Homicidal Plan: No Access to Homicidal Means: No Identified Victim: Reports of none History of harm to others?: No Assessment of Violence: None Noted Violent Behavior Description: Reports of none Does patient have access to weapons?: No Criminal Charges Pending?: No Does patient have a court date: No Prior Inpatient Therapy: Prior Inpatient Therapy: No Prior Outpatient Therapy: Prior Outpatient Therapy: No Does patient have an ACCT team?: No Does patient have Intensive In-House Services?  : No Does patient have [facility] services? : No Does patient have P4CC services?: No  Past Medical History: History reviewed. No pertinent past medical history. History reviewed. No pertinent surgical history. Family History: No family history on file. Family Psychiatric  History: Social History:  Social History   Substance and Sexual Activity  Alcohol Use Not Currently     Social History   Substance and Sexual Activity  Drug Use Not Currently    Social History   Socioeconomic History  . Marital  status: Married    Spouse name: Not on file  . Number of children: Not on file  . Years of education: Not on file  . Highest education level: Not on file  Occupational History  . Not on file  Tobacco Use  . Smoking status: Current Every Day Smoker    Types: Cigarettes  . Smokeless tobacco: Never Used   Substance and Sexual Activity  . Alcohol use: Not Currently  . Drug use: Not Currently  . Sexual activity: Not on file  Other Topics Concern  . Not on file  Social History Narrative  . Not on file   Social Determinants of Health   Financial Resource Strain:   . Difficulty of Paying Living Expenses:   Food Insecurity:   . Worried About Charity fundraiser in the Last Year:   . Arboriculturist in the Last Year:   Transportation Needs:   . Film/video editor (Medical):   Marland Kitchen Lack of Transportation (Non-Medical):   Physical Activity:   . Days of Exercise per Week:   . Minutes of Exercise per Session:   Stress:   . Feeling of Stress :   Social Connections:   . Frequency of Communication with Friends and Family:   . Frequency of Social Gatherings with Friends and Family:   . Attends Religious Services:   . Active Member of Clubs or Organizations:   . Attends Archivist Meetings:   Marland Kitchen Marital Status:    Additional Social History:    Allergies:  No Known Allergies  Labs:  Results for orders placed or performed during the hospital encounter of 05/13/19 (from the past 48 hour(s))  Comprehensive metabolic panel     Status: Abnormal   Collection Time: 05/13/19 12:55 PM  Result Value Ref Range   Sodium 138 135 - 145 mmol/L   Potassium 3.5 3.5 - 5.1 mmol/L   Chloride 102 98 - 111 mmol/L   CO2 23 22 - 32 mmol/L   Glucose, Bld 126 (H) 70 - 99 mg/dL    Comment: Glucose reference range applies only to samples taken after fasting for at least 8 hours.   BUN 12 6 - 20 mg/dL   Creatinine, Ser 0.72 0.61 - 1.24 mg/dL   Calcium 9.3 8.9 - 10.3 mg/dL   Total Protein 8.3 (H) 6.5 - 8.1 g/dL   Albumin 4.9 3.5 - 5.0 g/dL   AST 19 15 - 41 U/L   ALT 24 0 - 44 U/L   Alkaline Phosphatase 83 38 - 126 U/L   Total Bilirubin 0.8 0.3 - 1.2 mg/dL   GFR calc non Af Amer >60 >60 mL/min   GFR calc Af Amer >60 >60 mL/min   Anion gap 13 5 - 15    Comment: Performed at [facility], [address] [place], [place] [address]  Ethanol     Status: None   Collection Time: 05/13/19 12:55 PM  Result Value Ref Range   Alcohol, Ethyl (B) <10 <10 mg/dL    Comment: (NOTE) Lowest detectable limit for serum alcohol is 10 mg/dL. For medical purposes only. Performed at [facility], [address] [place], [place] [id number]   Salicylate level     Status: Abnormal   Collection Time: 05/13/19 12:55 PM  Result Value Ref Range   Salicylate Lvl [id number] (L) 7.0 - 30.0 mg/dL    Comment: Performed at [facility], [address], [place], [place] [address]  Acetaminophen level     Status: Abnormal   Collection Time: 05/13/19 12:55 PM  Result Value Ref Range   Acetaminophen (Tylenol), Serum <10 (L) 10 - 30 ug/mL    Comment: (NOTE) Therapeutic concentrations vary significantly. A range of 10-30 ug/mL  may be an effective concentration for many patients. However, some  are best treated at concentrations outside of this range. Acetaminophen concentrations >150 ug/mL at 4 hours after ingestion  and >50 ug/mL at 12 hours after ingestion are often associated with  toxic reactions. Performed at [facility], [address] [place], [place] [address]   cbc     Status: Abnormal   Collection Time: 05/13/19 12:55 PM  Result Value Ref Range   WBC 11.2 (H) 4.0 - 10.5 K/uL   RBC 5.03 4.22 - 5.81 MIL/uL   Hemoglobin 15.5 13.0 - 17.0 g/dL   HCT 45.5 39.0 - 52.0 %   MCV 90.5 80.0 - 100.0 fL   MCH 30.8 26.0 - 34.0 pg   MCHC 34.1 30.0 - 36.0 g/dL   RDW 12.8 11.5 - 15.5 %   Platelets 359 150 - 400 K/uL   nRBC 0.0 0.0 - 0.2 %    Comment: Performed at [facility], [address], [place], [place] [address]  Urine Drug Screen, Qualitative     Status: None   Collection Time: 05/13/19 12:55 PM  Result Value Ref Range   Tricyclic, Ur Screen NONE DETECTED NONE DETECTED   Amphetamines, Ur Screen NONE DETECTED NONE DETECTED   MDMA (Ecstasy)Ur Screen  NONE DETECTED NONE DETECTED   Cocaine Metabolite,Ur [place] NONE DETECTED NONE DETECTED   Opiate, Ur Screen NONE DETECTED NONE DETECTED   Phencyclidine (PCP) Ur S NONE DETECTED NONE DETECTED   Cannabinoid 50 Ng, Ur [place] NONE DETECTED NONE DETECTED   Barbiturates, Ur Screen NONE DETECTED NONE DETECTED   Benzodiazepine, Ur Scrn NONE DETECTED NONE DETECTED   Methadone Scn, Ur NONE DETECTED NONE DETECTED    Comment: (NOTE) Tricyclics + metabolites, urine    Cutoff 1000 ng/mL Amphetamines + metabolites, urine  Cutoff 1000 ng/mL MDMA (Ecstasy), urine              Cutoff 500 ng/mL Cocaine Metabolite, urine          Cutoff 300 ng/mL Opiate + metabolites, urine        Cutoff 300 ng/mL Phencyclidine (PCP), urine         Cutoff 25 ng/mL Cannabinoid, urine                 Cutoff 50 ng/mL Barbiturates + metabolites, urine  Cutoff 200 ng/mL Benzodiazepine, urine              Cutoff 200 ng/mL Methadone, urine                   Cutoff 300 ng/mL The urine drug screen provides only a preliminary, unconfirmed analytical test result and should not be used for non-medical purposes. Clinical consideration and professional judgment should be applied to any positive drug screen result due to possible interfering substances. A more specific alternate chemical method must be used in order to obtain a confirmed analytical result. Gas chromatography / mass spectrometry (GC/MS) is the preferred confirmat ory method. Performed at [facility], [address] [place], [place] [address]     No current facility-administered medications for this encounter.   Current Outpatient Medications  Medication Sig Dispense Refill  . aluminum-magnesium hydroxide-simethicone (MAALOX) 200-200-20  MG/5ML SUSP Take 30 mLs by mouth 4 (four) times daily -  before meals and at bedtime. 355 mL 0  . famotidine (PEPCID) 20 MG tablet Take 1 tablet (20 mg total) by mouth 2 (two) times daily. 60 tablet 0  . cyclobenzaprine  (FLEXERIL) 5 MG tablet Take 1 tablet (5 mg total) by mouth 3 (three) times daily as needed. (Patient not taking: Reported on 05/13/2019) 15 tablet 0     Blood pressure 110/73, pulse 76, temperature 97.8 F (36.6 C), temperature source Oral, resp. rate 20, SpO2 99 %.There is no height or weight on file to calculate BMI.  General Appearance: Guarded  Eye Contact:  Fair  Speech:  Slow  Volume:  Decreased  Mood:  Euthymic  Affect:  Constricted  Thought Process:  Linear  Orientation:  Full (Time, Place, and Person)  Thought Content:  Limited content. Pleasant but not providing much detail  Suicidal Thoughts:  No  Homicidal Thoughts:  No  Memory:  Immediate;   Fair  Judgement:  Fair  Insight:  Shallow  Psychomotor Activity:  Decreased  Concentration:  Concentration: Fair  Recall:  [employer] of Knowledge:  Fair  Language:  Fair  Akathisia:  No  Handed:  Ambidextrous  AIMS (if indicated):     Assets:  Desire for Improvement  ADL's:  Impaired  Cognition:  Impaired,  Mild  Sleep:        Treatment Plan Summary: Further evaluation on an inpatient basis  Disposition: Recommend psychiatric Inpatient admission when medically cleared.  Alesia Morin, MD 05/14/2019 4:26 PM

## 2019-05-15 NOTE — ED Notes (Signed)
Hourly rounding reveals patient sleeping in room. No complaints, stable, in no acute distress. Q15 minute rounds and monitoring via Security Cameras to continue. 

## 2019-05-15 NOTE — ED Notes (Signed)
Hourly rounding reveals patient awake in day room. No complaints, stable, in no acute distress. Q15 minute rounds and monitoring via Security Cameras to continue. 

## 2019-05-15 NOTE — Consult Note (Signed)
  The patient was seen face-to-face by this provider; chart reviewed and consulted with Dr. Mallie Darting on 05/15/2019 about patients case. It was discussed with the MD  that the patient does not meet the criteria to be admitted to the psychiatric inpatient unit.  The patient is alert and oriented x 4, calm, cooperative, and mood-congruent with affect on evaluation. The patient does not appear to be responding to internal or external stimuli. Neither is the patient presenting with any delusional thinking. The patient denies auditory or visual hallucinations. The patient denies suicidal, self-harm, and homicidal ideation. The patient is not presenting with any psychotic or paranoid behaviors. During an encounter with the patient, he was able to answer questions appropriately. He reports being under the influence of many drugs and does not recall the specific events leading to his admission. He reports that he does feel better and is not seeing anything and would like to go home. He does inquire about being able to return to the hospital if he develops any additional problems, in which he was advised yes. 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 He stated that he felt fine and if he needed to come back he would. WIll discharge home and rescind.   Plan: The patient is not a safety risk to self or others and does not require psychiatric inpatient admission for stabilization and treatment.

## 2019-05-15 NOTE — ED Notes (Signed)
Pt to nurses station door. "I need a stronger medication to knock me out. The medication they gave me last night wasn't strong enough."  Pt then returned to his room and laid down.

## 2019-05-15 NOTE — ED Notes (Addendum)
Hourly rounding reveals patient in rest room. No complaints, stable, in no acute distress. Q15 minute rounds and monitoring via Security Cameras to continue. 

## 2019-05-15 NOTE — ED Notes (Signed)
Pt was constantly back and forth up to RN station door asking questions or for crayons or for phone etc.   When pt returned to his room for a brief period he was laying on his bed masturbating. When the security officer went to doorway and he informed pt that he was being recorded and we could see him on camera and he needed to reframe from doing that. Pt replied with  "I cant help it."   Pt stopped   lw edt

## 2019-05-15 NOTE — ED Provider Notes (Signed)
-----------------------------------------   1:26 PM on 05/15/2019 -----------------------------------------  Patient has been seen and evaluated by psychiatry they believe the patient is safe for discharge home from a psychiatric standpoint.  Patient's medical work-up has been largely nonrevealing.   Harvest Dark, MD 05/15/19 1326

## 2019-05-15 NOTE — Discharge Instructions (Addendum)
You have been seen in the emergency department for a  psychiatric concern. You have been evaluated both medically as well as psychiatrically. Please follow-up with your outpatient resources provided. Return to the emergency department for any worsening symptoms, or any thoughts of hurting yourself or anyone else so that we may attempt to help you. 

## 2019-05-15 NOTE — ED Notes (Signed)
Pt coming to door frequently to see if he can leave. RN explained to patient she is still waiting on his D/C papers.

## 2019-05-15 NOTE — BH Specialist Note (Signed)
Writer spoke with the patient to complete an updated/reassessment. Patient denies SI/HI and AV/H.  Patient does not meet inpatient criteria.

## 2019-05-15 NOTE — ED Provider Notes (Signed)
Emergency Medicine Observation Re-evaluation Note  Ethan Sutton is a 34 y.o. male, seen on rounds today.  Pt initially presented to the ED for complaints of Psychiatric Evaluation Currently, the patient is resting in no acute distress.  Physical Exam  BP 127/71 (BP Location: Right Arm)   Pulse 91   Temp 98.5 F (36.9 C) (Oral)   Resp 20   SpO2 100%  Physical Exam  ED Course / MDM  EKG:    I have reviewed the labs performed to date as well as medications administered while in observation.  No events overnight.   Plan  Current plan is for psychiatric disposition. Patient is under full IVC at this time.   Paulette Blanch, MD 05/15/19 [phone]

## 2019-05-15 NOTE — ED Notes (Signed)
Pt dressing for discharge.  

## 2019-05-15 NOTE — ED Notes (Signed)
Pt discharged home. VS stable. Pt denies SI/HI.  All belongings returned to patient. Pt stated his ride was here. Pt did not want discharge paperwork.

## 2019-05-24 ENCOUNTER — Emergency Department
Admission: EM | Admit: 2019-05-24 | Discharge: 2019-05-25 | Disposition: A | Discharge status: Other Acute Inpatient Hospital | Payer: Self-pay | Attending: Emergency Medicine | Admitting: Emergency Medicine

## 2019-05-24 ENCOUNTER — Other Ambulatory Visit: Payer: Self-pay

## 2019-05-24 DIAGNOSIS — F29 Unspecified psychosis not due to a substance or known physiological condition: Secondary | ICD-10-CM | POA: Insufficient documentation

## 2019-05-24 DIAGNOSIS — F22 Delusional disorders: Secondary | ICD-10-CM

## 2019-05-24 DIAGNOSIS — R440 Auditory hallucinations: Secondary | ICD-10-CM | POA: Insufficient documentation

## 2019-05-24 DIAGNOSIS — Z20822 Contact with and (suspected) exposure to covid-19: Secondary | ICD-10-CM | POA: Insufficient documentation

## 2019-05-24 DIAGNOSIS — F17210 Nicotine dependence, cigarettes, uncomplicated: Secondary | ICD-10-CM | POA: Insufficient documentation

## 2019-05-24 DIAGNOSIS — F209 Schizophrenia, unspecified: Secondary | ICD-10-CM | POA: Diagnosis present

## 2019-05-24 LAB — RESPIRATORY PANEL BY RT PCR (FLU A&B, COVID)
Influenza A by PCR: NEGATIVE
Influenza B by PCR: NEGATIVE
SARS Coronavirus 2 by RT PCR: NEGATIVE

## 2019-05-24 MED ORDER — IBUPROFEN 600 MG PO TABS
600.0000 mg | ORAL_TABLET | Freq: Once | ORAL | Status: AC
  Administered 2019-05-24: 600 mg via ORAL
  Filled 2019-05-24: qty 1
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
## 2019-05-24 NOTE — ED Notes (Signed)
Pt given OJ and sandwich tray at this time
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
## 2019-05-24 NOTE — ED Notes (Signed)
IVC prior to arrival/ Psych Consult ordered/Moved to B[facility]-4

## 2019-05-24 NOTE — ED Notes (Signed)
Pt refusing to allow blood collection at this time. Pt informed of IVC status and need of blood for medical/psychiatric clearance/purposes, but pt continues to refuse. Will defer lab collection until later time.

## 2019-05-24 NOTE — ED Notes (Signed)
Pt pacing around hallway, irritataed, wanting a bed.  Pt was told we have a private room with a TV for him, but we need to get bloodwork first.  Pt refuses.  RN and MD aware.
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
## 2019-05-24 NOTE — ED Notes (Signed)
Pt belongings include: 1 gray t-shirt, 1 pr tan workboots, 1 pr jeans, 1 pr gray sweat 1 pr white/teal [employer] shorts, pants with [employer], 1 pr blue boxer briefs. Pt denies having a cell phone, wallet, cash, jewelry, or other valuables brought with him for this visit. Personal belongings placed in 2 bags, labeled and secured per policy.
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
## 2019-05-24 NOTE — ED Notes (Signed)
MD at bedside. 

## 2019-05-25 ENCOUNTER — Inpatient Hospital Stay
Admission: AD | Admit: 2019-05-25 | Discharge: 2019-05-27 | DRG: 897 | Disposition: A | Discharge status: Home / Self Care | Payer: No Typology Code available for payment source | Source: Intra-hospital | Attending: Psychiatry | Admitting: Psychiatry

## 2019-05-25 ENCOUNTER — Other Ambulatory Visit: Payer: Self-pay

## 2019-05-25 ENCOUNTER — Encounter: Payer: Self-pay | Admitting: Psychiatry

## 2019-05-25 DIAGNOSIS — F23 Brief psychotic disorder: Secondary | ICD-10-CM | POA: Diagnosis present

## 2019-05-25 DIAGNOSIS — F17210 Nicotine dependence, cigarettes, uncomplicated: Secondary | ICD-10-CM | POA: Diagnosis present

## 2019-05-25 DIAGNOSIS — F15159 Other stimulant abuse with stimulant-induced psychotic disorder, unspecified: Secondary | ICD-10-CM | POA: Diagnosis present

## 2019-05-25 DIAGNOSIS — Z630 Problems in relationship with spouse or partner: Secondary | ICD-10-CM | POA: Diagnosis not present

## 2019-05-25 DIAGNOSIS — Z818 Family history of other mental and behavioral disorders: Secondary | ICD-10-CM

## 2019-05-25 DIAGNOSIS — Z560 Unemployment, unspecified: Secondary | ICD-10-CM | POA: Diagnosis not present

## 2019-05-25 DIAGNOSIS — F1510 Other stimulant abuse, uncomplicated: Secondary | ICD-10-CM

## 2019-05-25 HISTORY — DX: Schizophrenia, unspecified: F20.9

## 2019-05-25 LAB — COMPREHENSIVE METABOLIC PANEL
ALT: 18 U/L (ref 0–44)
AST: 23 U/L (ref 15–41)
Albumin: 4.1 g/dL (ref 3.5–5.0)
Alkaline Phosphatase: 84 U/L (ref 38–126)
Anion gap: 9 (ref 5–15)
BUN: 14 mg/dL (ref 6–20)
CO2: 27 mmol/L (ref 22–32)
Calcium: 9.1 mg/dL (ref 8.9–10.3)
Chloride: 104 mmol/L (ref 98–111)
Creatinine, Ser: 0.82 mg/dL (ref 0.61–1.24)
GFR calc Af Amer: >60 mL/min (ref >60)
GFR calc non Af Amer: >60 mL/min (ref >60)
Glucose, Bld: 94 mg/dL (ref 70–99)
Potassium: 4.3 mmol/L (ref 3.5–5.1)
Sodium: 140 mmol/L (ref 135–145)
Total Bilirubin: 0.9 mg/dL (ref 0.3–1.2)
Total Protein: 7.4 g/dL (ref 6.5–8.1)

## 2019-05-25 LAB — CBC
HCT: 42.9 % (ref 39.0–52.0)
Hemoglobin: 14.2 g/dL (ref 13.0–17.0)
MCH: 30.7 pg (ref 26.0–34.0)
MCHC: 33.1 g/dL (ref 30.0–36.0)
MCV: 92.7 fL (ref 80.0–100.0)
Platelets: 297 10*3/uL (ref 150–400)
RBC: 4.63 MIL/uL (ref 4.22–5.81)
RDW: 13.1 % (ref 11.5–15.5)
WBC: 9.4 10*3/uL (ref 4.0–10.5)
nRBC: 0 % (ref 0.0–0.2)

## 2019-05-25 LAB — ETHANOL: Alcohol, Ethyl (B): <10 mg/dL (ref <10)

## 2019-05-25 LAB — URINE DRUG SCREEN, QUALITATIVE (ARMC ONLY)
Amphetamines, Ur Screen: POSITIVE — AB
Barbiturates, Ur Screen: NOT DETECTED
Benzodiazepine, Ur Scrn: NOT DETECTED
Cannabinoid 50 Ng, Ur ~~LOC~~: NOT DETECTED
Cocaine Metabolite,Ur ~~LOC~~: NOT DETECTED
MDMA (Ecstasy)Ur Screen: NOT DETECTED
Methadone Scn, Ur: NOT DETECTED
Opiate, Ur Screen: NOT DETECTED
Phencyclidine (PCP) Ur S: NOT DETECTED
Tricyclic, Ur Screen: NOT DETECTED

## 2019-05-25 LAB — SALICYLATE LEVEL: Salicylate Lvl: <7 mg/dL — ABNORMAL LOW (ref 7.0–30.0)

## 2019-05-25 LAB — ACETAMINOPHEN LEVEL: Acetaminophen (Tylenol), Serum: <10 ug/mL — ABNORMAL LOW (ref 10–30)

## 2019-05-25 MED ORDER — FAMOTIDINE 20 MG PO TABS
20.0000 mg | ORAL_TABLET | Freq: Two times a day (BID) | ORAL | Status: DC
  Administered 2019-05-26 – 2019-05-27 (×3): 20 mg via ORAL
  Filled 2019-05-25 (×4): qty 1

## 2019-05-25 MED ORDER — ALUM & MAG HYDROXIDE-SIMETH 200-200-20 MG/5ML PO SUSP: 30.0000 mL | ORAL | Status: DC | PRN

## 2019-05-25 MED ORDER — TRAZODONE HCL 100 MG PO TABS
100.0000 mg | ORAL_TABLET | Freq: Every evening | ORAL | Status: DC | PRN
  Administered 2019-05-25: 21:00:00 100 mg via ORAL
  Filled 2019-05-25: qty 1

## 2019-05-25 MED ORDER — ACETAMINOPHEN 325 MG PO TABS
650.0000 mg | ORAL_TABLET | Freq: Four times a day (QID) | ORAL | Status: DC | PRN
  Administered 2019-05-25: 21:00:00 650 mg via ORAL
  Filled 2019-05-25: qty 2

## 2019-05-25 MED ORDER — MAGNESIUM HYDROXIDE 400 MG/5ML PO SUSP: 30.0000 mL | Freq: Every day | ORAL | Status: DC | PRN

## 2019-05-25 NOTE — Plan of Care (Signed)
All goals are currently unmet and in not progressing state as patient  is newly admitted to [facility].   Problem: Education: Goal: Knowledge of [facility] Education information/materials will improve Outcome: Not Progressing Goal: Emotional status will improve Outcome: Not Progressing Goal: Mental status will improve Outcome: Not Progressing Goal: Verbalization of understanding the information provided will improve Outcome: Not Progressing   Problem: Activity: Goal: Interest or engagement in activities will improve Outcome: Not Progressing Goal: Sleeping patterns will improve Outcome: Not Progressing   Problem: Coping: Goal: Ability to verbalize frustrations and anger appropriately will improve Outcome: Not Progressing Goal: Ability to demonstrate self-control will improve Outcome: Not Progressing   Problem: Health Behavior/Discharge Planning: Goal: Identification of resources available to assist in meeting health care needs will improve Outcome: Not Progressing Goal: Compliance with treatment plan for underlying cause of condition will improve Outcome: Not Progressing   Problem: Physical Regulation: Goal: Ability to maintain clinical measurements within normal limits will improve Outcome: Not Progressing   Problem: Safety: Goal: Periods of time without injury will increase Outcome: Not Progressing   Problem: Education: Goal: Ability to make informed decisions regarding treatment will improve Outcome: Not Progressing   Problem: Coping: Goal: Coping ability will improve Outcome: Not Progressing   Problem: Medication: Goal: Compliance with prescribed medication regimen will improve Outcome: Not Progressing   Problem: Self-Concept: Goal: Ability to disclose and discuss suicidal ideas will improve Outcome: Not Progressing Goal: Will verbalize positive feelings about self Outcome: Not Progressing

## 2019-05-25 NOTE — Tx Team (Signed)
Initial Treatment Plan 05/25/2019 3:27 AM [name] [id number]    PATIENT STRESSORS: Medication change or noncompliance Substance abuse   PATIENT STRENGTHS: Ability for insight Motivation for treatment/growth   PATIENT IDENTIFIED PROBLEMS: Psychosis                      DISCHARGE CRITERIA:  Improved stabilization in mood, thinking, and/or behavior  PRELIMINARY DISCHARGE PLAN: Outpatient therapy  PATIENT/FAMILY INVOLVEMENT: This treatment plan has been presented to and reviewed with the patient, Ethan Sutton,   The patient and family have been given the opportunity to ask questions and make suggestions.  Harl Bowie, RN 05/25/2019, 3:27 AM

## 2019-05-25 NOTE — Progress Notes (Signed)
Pt ate lunch but has been withdrawn in his room. Pt is calm and appropriate. Collier Bullock RN

## 2019-05-25 NOTE — Plan of Care (Signed)
  Problem: Self-Concept: Goal: Will verbalize positive feelings about self Outcome: Progressing   Problem: Self-Concept: Goal: Ability to disclose and discuss suicidal ideas will improve Outcome: Progressing  Patient verbalized positive feelings he denies SI/HI/AVH .

## 2019-05-25 NOTE — Progress Notes (Signed)
Pt has been scene going into two different rooms. He needs to be closely monitored. When asked if he needs anything he denies. Patients are safe. Will continue to monitor. Will pass this info onto upcoming staff. Collier Bullock RN

## 2019-05-25 NOTE — [facility] Suicide Risk Assessment (Signed)
[place] INPATIENT:  Family/Significant Other Suicide Prevention Education  Suicide Prevention Education:  Patient Refusal for Family/Significant Other Suicide Prevention Education: The patient Ethan Sutton has refused to provide written consent for family/significant other to be provided Family/Significant Other Suicide Prevention Education during admission and/or prior to discharge.  Physician notified.  SPE completed with pt, as pt refused to consent to family contact. SPI pamphlet provided to pt and pt was encouraged to share information with support network, ask questions, and talk about any concerns relating to SPE. Pt denies access to guns/firearms and verbalized understanding of information provided. Mobile Crisis information also provided to pt.    Rozann Lesches 05/25/2019, 10:39 AM

## 2019-05-25 NOTE — Progress Notes (Signed)
Recreation Therapy Notes   Date: 05/25/2019  Time: 9:30 am   Location: Outside    Behavioral response: N/A   Intervention Topic: Leisure    Discussion/Intervention: Patient did not attend group.   Clinical Observations/Feedback:  Patient did not attend group.   Trayonna Bachmeier LRT/CTRS        Adana Marik 05/25/2019 12:11 PM

## 2019-05-25 NOTE — Tx Team (Addendum)
Interdisciplinary Treatment and Diagnostic Plan Update  05/25/2019 Time of Session: 9:00AM Ethan Sutton MRN: [id number]  Principal Diagnosis: <principal problem not specified>  Secondary Diagnoses: Active Problems:   Acute psychosis ([place])   Current Medications:  Current Facility-Administered Medications  Medication Dose Route Frequency Provider Last Rate Last Admin  . acetaminophen (TYLENOL) tablet 650 mg  650 mg Oral Q6H PRN Caroline Sauger, NP      . alum & mag hydroxide-simeth (MAALOX/MYLANTA) 200-200-20 MG/5ML suspension 30 mL  30 mL Oral Q4H PRN Caroline Sauger, NP      . famotidine (PEPCID) tablet 20 mg  20 mg Oral BID Caroline Sauger, NP      . magnesium hydroxide (MILK OF MAGNESIA) suspension 30 mL  30 mL Oral Daily PRN Caroline Sauger, NP      . traZODone (DESYREL) tablet 100 mg  100 mg Oral QHS PRN Caroline Sauger, NP       PTA Medications: Medications Prior to Admission  Medication Sig Dispense Refill Last Dose  . aluminum-magnesium hydroxide-simethicone (MAALOX) 482-707-86 MG/5ML SUSP Take 30 mLs by mouth 4 (four) times daily -  before meals and at bedtime. 355 mL 0   . famotidine (PEPCID) 20 MG tablet Take 1 tablet (20 mg total) by mouth 2 (two) times daily. 60 tablet 0     Patient Stressors: Medication change or noncompliance Substance abuse  Patient Strengths: Ability for insight Motivation for treatment/growth  Treatment Modalities: Medication Management, Group therapy, Case management,  1 to 1 session with clinician, Psychoeducation, Recreational therapy.   Physician Treatment Plan for Primary Diagnosis: <principal problem not specified> Long Term Goal(s):     Short Term Goals:    Medication Management: Evaluate patient's response, side effects, and tolerance of medication regimen.  Therapeutic Interventions: 1 to 1 sessions, Unit Group sessions and Medication administration.  Evaluation of Outcomes: Not Met  Physician  Treatment Plan for Secondary Diagnosis: Active Problems:   Acute psychosis ([place])  Long Term Goal(s):     Short Term Goals:       Medication Management: Evaluate patient's response, side effects, and tolerance of medication regimen.  Therapeutic Interventions: 1 to 1 sessions, Unit Group sessions and Medication administration.  Evaluation of Outcomes: Not Met   RN Treatment Plan for Primary Diagnosis: <principal problem not specified> Long Term Goal(s): Knowledge of disease and therapeutic regimen to maintain health will improve  Short Term Goals: Ability to demonstrate self-control, Ability to participate in decision making will improve, Ability to verbalize feelings will improve, Ability to identify and develop effective coping behaviors will improve and Compliance with prescribed medications will improve  Medication Management: RN will administer medications as ordered by provider, will assess and evaluate patient's response and provide education to patient for prescribed medication. RN will report any adverse and/or side effects to prescribing provider.  Therapeutic Interventions: 1 on 1 counseling sessions, Psychoeducation, Medication administration, Evaluate responses to treatment, Monitor vital signs and CBGs as ordered, Perform/monitor CIWA, COWS, AIMS and Fall Risk screenings as ordered, Perform wound care treatments as ordered.  Evaluation of Outcomes: Not Met   LCSW Treatment Plan for Primary Diagnosis: <principal problem not specified> Long Term Goal(s): Safe transition to appropriate next level of care at discharge, Engage patient in therapeutic group addressing interpersonal concerns.  Short Term Goals: Engage patient in aftercare planning with referrals and resources, Increase social support, Increase ability to appropriately verbalize feelings, Increase emotional regulation, Facilitate acceptance of mental health diagnosis and concerns and Increase skills for wellness and  recovery  Therapeutic Interventions: Assess for all discharge needs, 1 to 1 time with Education officer, museum, Explore available resources and support systems, Assess for adequacy in community support network, Educate family and significant other(s) on suicide prevention, Complete Psychosocial Assessment, Interpersonal group therapy.  Evaluation of Outcomes: Not Met   Progress in Treatment: Attending groups: No. Participating in groups: No. Taking medication as prescribed: Yes. Toleration medication: Yes. Family/Significant other contact made: Yes, individual(s) contacted:  once permission is given. Patient understands diagnosis: Yes. Discussing patient identified problems/goals with staff: Yes. Medical problems stabilized or resolved: Yes. Denies suicidal/homicidal ideation: Yes. Issues/concerns per patient self-inventory: No. Other: none  New problem(s) identified: No, Describe:  none  New Short Term/Long Term Goal(s): detox, elimination of symptoms of psychosis, medication management for mood stabilization; elimination of SI thoughts; development of comprehensive mental wellness/sobriety plan.  Patient Goals:  "I'm ready to go".  Discharge Plan or Barriers: CSW will assist patient in developing appropriate discharge plans.   Reason for Continuation of Hospitalization: Anxiety Depression Medication stabilization  Estimated Length of Stay:  1-7 days  Recreational Therapy: Patient Stressors: N/A Patient Goal: Patient will engage in groups without prompting or encouragement from LRT x3 group sessions within 5 recreation therapy group sessions   Attendees: Patient: Ethan Sutton 05/25/2019 9:16 AM  Physician: Dr. Weber Cooks, MD 05/25/2019 9:16 AM  Nursing: Lyda Kalata, RN 05/25/2019 9:16 AM  RN Care Manager: 05/25/2019 9:16 AM  Social Worker: Assunta Curtis, LCSW 05/25/2019 9:16 AM  Recreational Therapist: Devin Going, LRT 05/25/2019 9:16 AM  Other: Sanjuana Kava, LCSW  05/25/2019 9:16 AM  Other:  05/25/2019 9:16 AM  Other: 05/25/2019 9:16 AM    Scribe for Treatment Team: Rozann Lesches, LCSW 05/25/2019 9:16 AM

## 2019-05-25 NOTE — B[facility] Counselor (Signed)
Adult Comprehensive Assessment  Patient ID: Ethan Sutton, male   DOB: 1985-07-13, 34 y.o.   MRN: [id number]  Information Source: Information source: Patient  Current Stressors:  Patient states their primary concerns and needs for treatment are:: Pt reports "people". Patient states their goals for this hospitilization and ongoing recovery are:: Pt reports "get out". Educational / Learning stressors: Pt denies. Employment / Job issues: Pt denies. Family Relationships: Pt denies. Financial / Lack of resources (include bankruptcy): Pt denies. Housing / Lack of housing: Pt denies. Physical health (include injuries & life threatening diseases): Pt denies. Social relationships: Pt denies. 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 Bereavement / Loss: Pt denies.  Living/Environment/Situation:  Living Arrangements: Parent Who else lives in the home?: Mother How long has patient lived in current situation?: "not long" What is atmosphere in current home: Comfortable, Quarry manager, Supportive  Family History:  Marital status: Single Are you sexually active?: Yes What is your sexual orientation?: "don't know" Has your sexual activity been affected by drugs, alcohol, medication, or emotional stress?: Pt denies. Does patient have children?: Yes How many children?: 3 How is patient's relationship with their children?: "fine"  Childhood History:  By whom was/is the patient raised?: Mother Description of patient's relationship with caregiver when they were a child: "I don't know" Patient's description of current relationship with people who raised him/her: "good" How were you disciplined when you got in trouble as a child/adolescent?: "whooped" Does patient have siblings?: Yes Number of Siblings: 3 Description of patient's current relationship with siblings: "straight" Did patient suffer any verbal/emotional/physical/sexual abuse as a child?: No Did patient suffer from severe childhood neglect?:  No Has patient ever been sexually abused/assaulted/raped as an adolescent or adult?: No Was the patient ever a victim of a crime or a disaster?: No Witnessed domestic violence?: No Has patient been effected by domestic violence as an adult?: No  Education:  Highest grade of school patient has completed: 12th Currently a student?: No Learning disability?: No  Employment/Work Situation:   Employment situation: Employed 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, mining" How long has patient been employed?: "almost a year" Patient's job has been impacted by current illness: No What is the longest time patient has a held a job?: 8 years Where was the patient employed at that time?: "[employer]" Did You Receive Any Psychiatric Treatment/Services While in the Military?: No(NA) Are There Guns or Other Weapons in [place]?: No  Financial Resources:   Financial resources: Income from employment, Food stamps Does patient have a representative payee or guardian?: No  Alcohol/Substance Abuse:   What has been your use of drugs/alcohol within the last 12 months?: Pt initially reported heroin use, however, denied use as this CSW completed the assessment. If attempted suicide, did drugs/alcohol play a role in this?: No Alcohol/Substance Abuse Treatment Hx: Denies past history Has alcohol/substance abuse ever caused legal problems?: No  Social Support System:   Patient's Community Support System: None Type of faith/religion: Pt denies.  Leisure/Recreation:      Strengths/Needs:   What is the patient's perception of their strengths?: Pt denies. Patient states these barriers may affect/interfere with their treatment: Pt denies. Patient states these barriers may affect their return to the community: Pt denies.  Discharge Plan:   Currently receiving community mental health services: No Patient states concerns and preferences for aftercare planning are: Pt reports he is open to referral for  outpatient. Patient states they will know when they are safe and ready for discharge when: "because I'm  not doing nothing" Does patient have access to transportation?: Yes Does patient have financial barriers related to discharge medications?: Yes Patient description of barriers related to discharge medications: Pt does not have insurance. Will patient be returning to same living situation after discharge?: Yes  Summary/Recommendations:   Summary and Recommendations (to be completed by the evaluator): Patient is a 34 year old male from [place], [place] [facility]).   He presents to the hospital following concerns for paranoia and reported use of substances.  He has a primary diagnosis of Psychosis.  Recommendations include: crisis stabilization, therapeutic milieu, encourage group attendance and participation, medication management for detox/mood stabilization and development of comprehensive mental wellness/sobriety plan.  Rozann Lesches. 05/25/2019

## 2019-05-25 NOTE — Progress Notes (Signed)
Pt refuses to eat breakfast and take his morning med. He is calm and appropriate denying everything, Pt is sleeping,  Collier Bullock RN
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
## 2019-05-25 NOTE — BHH Suicide Risk Assessment (Signed)
[facility] Admission Suicide Risk Assessment   Nursing information obtained from:  Patient Demographic factors:  Male, Unemployed, Low socioeconomic status Current Mental Status:  NA Loss Factors:  Financial problems / change in socioeconomic status Historical Factors:  Impulsivity Risk Reduction Factors:  Positive therapeutic relationship, Positive social support  Total Time spent with patient: 1 hour Principal Problem: Acute psychosis ([place]) Diagnosis:  Principal Problem:   Acute psychosis ([place]) Active Problems:   Amphetamine abuse ([place])  Subjective Data: This is a 34 year old man with uncertain past psychiatric history who presents for the second time in a month to the emergency room with psychotic symptoms.  Patient is only slightly cooperative with the interview.  Denies however having any suicidal thoughts or intent.  He admits to drug use although he claims that he has no idea what kind of drugs he has been using.  He is not cooperative enough to give much history about recent behavior or plans for the future.  Continued Clinical Symptoms:  Alcohol Use Disorder Identification Test Final Score (AUDIT): 0 The "Alcohol Use Disorders Identification Test", Guidelines for Use in Primary Care, Second Edition.  World Pharmacologist [employer]). Score between 0-7:  no or low risk or alcohol related problems. Score between 8-15:  moderate risk of alcohol related problems. Score between 16-19:  high risk of alcohol related problems. Score 20 or above:  warrants further diagnostic evaluation for alcohol dependence and treatment.   CLINICAL FACTORS:   Alcohol/Substance Abuse/Dependencies Schizophrenia:   Less than 33 years old   Musculoskeletal: Strength & Muscle Tone: within normal limits Gait & Station: normal Patient leans: N/A  Psychiatric Specialty Exam: Physical Exam  Nursing note and vitals reviewed. Constitutional: He appears well-developed and well-nourished.  HENT:  Head:  Normocephalic and atraumatic.  Eyes: Pupils are equal, round, and reactive to light. Conjunctivae are normal.  Cardiovascular: Regular rhythm and normal heart sounds.  Respiratory: Effort normal. No respiratory distress.  GI: Soft.  Musculoskeletal:        General: Normal range of motion.     Cervical back: Normal range of motion.  Neurological: He is alert.  Skin: Skin is warm and dry.  Psychiatric: His mood appears anxious. His affect is angry and blunt. His speech is delayed and tangential. He is slowed. Thought content is paranoid. Cognition and memory are impaired. He expresses impulsivity and inappropriate judgment. He expresses no homicidal and no suicidal ideation.    Review of Systems  Constitutional: Negative.   HENT: Negative.   Eyes: Negative.   Respiratory: Negative.   Cardiovascular: Negative.   Gastrointestinal: Negative.   Musculoskeletal: Negative.   Skin: Negative.   Neurological: Negative.   Psychiatric/Behavioral: Positive for behavioral problems, confusion and hallucinations.    Blood pressure 115/72, pulse 78, temperature 99 F (37.2 C), temperature source Oral, resp. rate 18, height 5\' 11"  (1.803 m), weight 78.5 kg, SpO2 100 %.Body mass index is 24.13 kg/m.  General Appearance: Casual  Eye Contact:  Minimal  Speech:  Blocked and Slow  Volume:  Decreased  Mood:  Dysphoric and Irritable  Affect:  Blunt  Thought Process:  Disorganized  Orientation:  Full (Time, Place, and Person)  Thought Content:  Illogical, Paranoid Ideation and Rumination  Suicidal Thoughts:  No  Homicidal Thoughts:  No  Memory:  Immediate;   Fair Recent;   Poor Remote;   Poor  Judgement:  Impaired  Insight:  Shallow  Psychomotor Activity:  Restlessness  Concentration:  Concentration: Poor  Recall:  Fair  Fund of Knowledge:  Fair  Language:  Fair  Akathisia:  No  Handed:  Right  AIMS (if indicated):     Assets:  Housing Physical Health Social Support  ADL's:  Impaired   Cognition:  Impaired,  Mild  Sleep:  Number of Hours: 3.75      COGNITIVE FEATURES THAT CONTRIBUTE TO RISK:  Loss of executive function and Polarized thinking    SUICIDE RISK:   Mild:  Suicidal ideation of limited frequency, intensity, duration, and specificity.  There are no identifiable plans, no associated intent, mild dysphoria and related symptoms, good self-control (both objective and subjective assessment), few other risk factors, and identifiable protective factors, including available and accessible social support.  PLAN OF CARE: Continue 15-minute checks.  Work with full treatment team and trying to establish rapport and gather collateral information.  Patient likely will be a candidate for antipsychotic medication but he made it impossible to have that conversation today.  Ongoing reassessment of dangerousness prior to discharge  I certify that inpatient services furnished can reasonably be expected to improve the patient's condition.   Alethia Berthold, MD 05/25/2019, 9:22 AM

## 2019-05-25 NOTE — ED Notes (Signed)
Patient is to be admitted to [facility] by Psychiatric Nurse Practitioner Caroline Sauger.  Attending Physician will be Dr. Weber Cooks.   Patient has been assigned to room 303, by [facility] Charge Nurse .   Intake Paper Work has been signed and placed on patient chart.  ER staff is aware of the admission:  ER Secretary    Dr.  ER MD   Maudie Mercury Patient's Nurse   Patient Access.

## 2019-05-26 MED ORDER — RISPERIDONE 1 MG PO TBDP: 1.0000 mg | ORAL_TABLET | Freq: Two times a day (BID) | ORAL | 0 refills | Status: DC

## 2019-05-26 MED ORDER — NICOTINE 21 MG/24HR TD PT24
21.0000 mg | MEDICATED_PATCH | Freq: Every day | TRANSDERMAL | Status: DC
  Administered 2019-05-26 – 2019-05-27 (×2): 21 mg via TRANSDERMAL
  Filled 2019-05-26 (×2): qty 1

## 2019-05-26 MED ORDER — RISPERIDONE 1 MG PO TBDP
1.0000 mg | ORAL_TABLET | Freq: Two times a day (BID) | ORAL | Status: DC
  Administered 2019-05-26 – 2019-05-27 (×3): 1 mg via ORAL
  Filled 2019-05-26 (×3): qty 1

## 2019-05-26 MED ORDER — TRAZODONE HCL 100 MG PO TABS: 100.0000 mg | ORAL_TABLET | Freq: Every evening | ORAL | 0 refills | Status: DC | PRN

## 2019-05-26 NOTE — Plan of Care (Signed)
Patient is calm and appropriate in the unit.Patient stated that the medicine helped him with his thinking.Denies SI,HI and AVH.Compliant with medications.Did not attend groups.Patient is looking forward for discharge tomorrow.Appetite and energy level good.Support and encouragement given.

## 2019-05-26 NOTE — B[facility] Group Notes (Signed)
LCSW Group Therapy Note  05/26/2019 1:00 PM  Type of Therapy/Topic:  Group Therapy:  Emotion Regulation  Participation Level:  Did Not Attend   Description of Group:   The purpose of this group is to assist patients in learning to regulate negative emotions and experience positive emotions. Patients will be guided to discuss ways in which they have been vulnerable to their negative emotions. These vulnerabilities will be juxtaposed with experiences of positive emotions or situations, and patients will be challenged to use positive emotions to combat negative ones. Special emphasis will be placed on coping with negative emotions in conflict situations, and patients will process healthy conflict resolution skills.  Therapeutic Goals: 1. Patient will identify two positive emotions or experiences to reflect on in order to balance out negative emotions 2. Patient will label two or more emotions that they find the most difficult to experience 3. Patient will demonstrate positive conflict resolution skills through discussion and/or role plays  Summary of Patient Progress: X  Therapeutic Modalities:   Cognitive Behavioral Therapy Feelings Identification Dialectical Behavioral Therapy  Assunta Curtis, MSW, LCSW 05/26/2019 2:32 PM

## 2019-05-26 NOTE — Progress Notes (Signed)
Recreation Therapy Notes   Date: 05/26/2019  Time: 9:30 am   Location: Craft room    Behavioral response: N/A   Intervention Topic: Happiness   Discussion/Intervention: Patient did not attend group.   Clinical Observations/Feedback:  Patient did not attend group.   Dailan Pfalzgraf LRT/CTRS         Kyriaki Moder 05/26/2019 1:17 PM

## 2019-05-26 NOTE — Plan of Care (Signed)
  Problem: Education: Goal: Emotional status will improve Outcome: Progressing Goal: Mental status will improve Outcome: Progressing Goal: Verbalization of understanding the information provided will improve Outcome: Not Progressing   Problem: Activity: Goal: Interest or engagement in activities will improve Outcome: Progressing Goal: Sleeping patterns will improve Outcome: Not Progressing   Problem: Coping: Goal: Ability to verbalize frustrations and anger appropriately will improve Outcome: Not Progressing Goal: Ability to demonstrate self-control will improve Outcome: Not Progressing   Problem: Health Behavior/Discharge Planning: Goal: Compliance with treatment plan for underlying cause of condition will improve Outcome: Progressing   Problem: Education: Goal: Ability to make informed decisions regarding treatment will improve Outcome: Progressing   Problem: Coping: Goal: Coping ability will improve Outcome: Progressing   Problem: Medication: Goal: Compliance with prescribed medication regimen will improve Outcome: Progressing   Problem: Self-Concept: Goal: Will verbalize positive feelings about self Outcome: Not Progressing

## 2019-05-26 NOTE — Progress Notes (Signed)
[facility] MD Progress Note  05/26/2019 5:52 PM Ethan Sutton  MRN:  [id number] Subjective: Patient seen and chart reviewed.  Follow-up for this 34 year old man with acute psychosis and substance abuse.  Patient continues to be paranoid at times.  Yesterday staff observed him going into patient's rooms frequently looking confused.  On interview today his insight is still very limited.  Focused entirely on discharge.  Later in the afternoon however he voiced some willingness to take psychiatric medicine.  He has not been violent or threatening. Principal Problem: Acute psychosis ([place]) Diagnosis: Principal Problem:   Acute psychosis ([place]) Active Problems:   Amphetamine abuse ([place])  Total Time spent with patient: 30 minutes  Past Psychiatric History: Past history of substance abuse problems  Past Medical History:  Past Medical History:  Diagnosis Date  . Schizophrenia ([place])    History reviewed. No pertinent surgical history. Family History: History reviewed. No pertinent family history. Family Psychiatric  History: See previous Social History:  Social History   Substance and Sexual Activity  Alcohol Use Not Currently     Social History   Substance and Sexual Activity  Drug Use Not Currently    Social History   Socioeconomic History  . Marital status: Single    Spouse name: Not on file  . Number of children: Not on file  . Years of education: Not on file  . Highest education level: Not on file  Occupational History  . Not on file  Tobacco Use  . Smoking status: Current Every Day Smoker    Types: Cigarettes  . Smokeless tobacco: Never Used  Substance and Sexual Activity  . Alcohol use: Not Currently  . Drug use: Not Currently  . Sexual activity: Not Currently    Birth control/protection: Condom, Abstinence  Other Topics Concern  . Not on file  Social History Narrative  . Not on file   Social Determinants of Health   Financial Resource Strain:   . Difficulty of Paying  Living Expenses:   Food Insecurity:   . Worried About Charity fundraiser in the Last Year:   . Arboriculturist in the Last Year:   Transportation Needs:   . Film/video editor (Medical):   Marland Kitchen Lack of Transportation (Non-Medical):   Physical Activity:   . Days of Exercise per Week:   . Minutes of Exercise per Session:   Stress:   . Feeling of Stress :   Social Connections:   . Frequency of Communication with Friends and Family:   . Frequency of Social Gatherings with Friends and Family:   . Attends Religious Services:   . Active Member of Clubs or Organizations:   . Attends Archivist Meetings:   Marland Kitchen Marital Status:    Additional Social History:    History of alcohol / drug use?: Yes                    Sleep: Fair  Appetite:  Fair  Current Medications: Current Facility-Administered Medications  Medication Dose Route Frequency Provider Last Rate Last Admin  . acetaminophen (TYLENOL) tablet 650 mg  650 mg Oral Q6H PRN Caroline Sauger, NP   650 mg at 05/25/19 2108  . alum & mag hydroxide-simeth (MAALOX/MYLANTA) 200-200-20 MG/5ML suspension 30 mL  30 mL Oral Q4H PRN Caroline Sauger, NP      . famotidine (PEPCID) tablet 20 mg  20 mg Oral BID Caroline Sauger, NP   20 mg at 05/26/19 1601  .  magnesium hydroxide (MILK OF MAGNESIA) suspension 30 mL  30 mL Oral Daily PRN Caroline Sauger, NP      . nicotine (NICODERM CQ - dosed in mg/24 hours) patch 21 mg  21 mg Transdermal Daily Ziomara Birenbaum, Madie Reno, MD   21 mg at 05/26/19 0923  . risperiDONE (RISPERDAL M-TABS) disintegrating tablet 1 mg  1 mg Oral BID Yusuke Beza, Madie Reno, MD   1 mg at 05/26/19 1601  . traZODone (DESYREL) tablet 100 mg  100 mg Oral QHS PRN Caroline Sauger, NP   100 mg at 05/25/19 2108    Lab Results:  Results for orders placed or performed during the hospital encounter of 05/24/19 (from the past 48 hour(s))  Respiratory Panel by RT PCR (Flu A&B, Covid) - Nasopharyngeal Swab      Status: None   Collection Time: 05/24/19  8:28 PM   Specimen: Nasopharyngeal Swab  Result Value Ref Range   SARS Coronavirus 2 by RT PCR NEGATIVE NEGATIVE    Comment: (NOTE) SARS-CoV-2 target nucleic acids are NOT DETECTED. The SARS-CoV-2 RNA is generally detectable in upper respiratoy specimens during the acute phase of infection. The lowest concentration of SARS-CoV-2 viral copies this assay can detect is 131 copies/mL. A negative result does not preclude SARS-Cov-2 infection and should not be used as the sole basis for treatment or other patient management decisions. A negative result may occur with  improper specimen collection/handling, submission of specimen other than nasopharyngeal swab, presence of viral mutation(s) within the areas targeted by this assay, and inadequate number of viral copies (<131 copies/mL). A negative result must be combined with clinical observations, patient history, and epidemiological information. The expected result is Negative. Fact Sheet for Patients:  PinkCheek.be Fact Sheet for Healthcare Providers:  GravelBags.it This test is not yet ap proved or cleared by the [place] FDA and  has been authorized for detection and/or diagnosis of SARS-CoV-2 by FDA under an Emergency Use Authorization (EUA). This EUA will remain  in effect (meaning this test can be used) for the duration of the COVID-19 declaration under Section 564(b)(1) of the Act, 21 U.S.C. section 360bbb-3(b)(1), unless the authorization is terminated or revoked sooner.    Influenza A by PCR NEGATIVE NEGATIVE   Influenza B by PCR NEGATIVE NEGATIVE    Comment: (NOTE) The Xpert Xpress SARS-CoV-2/FLU/RSV assay is intended as an aid in  the diagnosis of influenza from Nasopharyngeal swab specimens and  should not be used as a sole basis for treatment. Nasal washings and  aspirates are unacceptable for Xpert Xpress  SARS-CoV-2/FLU/RSV  testing. Fact Sheet for Patients: PinkCheek.be Fact Sheet for Healthcare Providers: GravelBags.it This test is not yet approved or cleared by the [place] FDA and  has been authorized for detection and/or diagnosis of SARS-CoV-2 by  FDA under an Emergency Use Authorization (EUA). This EUA will remain  in effect (meaning this test can be used) for the duration of the  Covid-19 declaration under Section 564(b)(1) of the Act, 21  U.S.C. section 360bbb-3(b)(1), unless the authorization is  terminated or revoked. Performed at [facility], [address] [place], [place] [address]   Comprehensive metabolic panel     Status: None   Collection Time: 05/25/19 12:38 AM  Result Value Ref Range   Sodium 140 135 - 145 mmol/L   Potassium 4.3 3.5 - 5.1 mmol/L   Chloride 104 98 - 111 mmol/L   CO2 27 22 - 32 mmol/L   Glucose, Bld 94 70 - 99  mg/dL    Comment: Glucose reference range applies only to samples taken after fasting for at least 8 hours.   BUN 14 6 - 20 mg/dL   Creatinine, Ser 0.82 0.61 - 1.24 mg/dL   Calcium 9.1 8.9 - 10.3 mg/dL   Total Protein 7.4 6.5 - 8.1 g/dL   Albumin 4.1 3.5 - 5.0 g/dL   AST 23 15 - 41 U/L   ALT 18 0 - 44 U/L   Alkaline Phosphatase 84 38 - 126 U/L   Total Bilirubin 0.9 0.3 - 1.2 mg/dL   GFR calc non Af Amer >60 >60 mL/min   GFR calc Af Amer >60 >60 mL/min   Anion gap 9 5 - 15    Comment: Performed at [facility], [address] [place], [place] [address]  Ethanol     Status: None   Collection Time: 05/25/19 12:38 AM  Result Value Ref Range   Alcohol, Ethyl (B) <10 <10 mg/dL    Comment: (NOTE) Lowest detectable limit for serum alcohol is 10 mg/dL. For medical purposes only. Performed at [facility], [address] [place], [place] [address]   cbc     Status: None   Collection Time: 05/25/19 12:38 AM  Result Value Ref Range   WBC 9.4  4.0 - 10.5 K/uL   RBC 4.63 4.22 - 5.81 MIL/uL   Hemoglobin 14.2 13.0 - 17.0 g/dL   HCT 42.9 39.0 - 52.0 %   MCV 92.7 80.0 - 100.0 fL   MCH 30.7 26.0 - 34.0 pg   MCHC 33.1 30.0 - 36.0 g/dL   RDW 13.1 11.5 - 15.5 %   Platelets 297 150 - 400 K/uL   nRBC 0.0 0.0 - 0.2 %    Comment: Performed at [facility], [address], [place], [place] [address]  Urine Drug Screen, Qualitative     Status: Abnormal   Collection Time: 05/25/19 12:38 AM  Result Value Ref Range   Tricyclic, Ur Screen NONE DETECTED NONE DETECTED   Amphetamines, Ur Screen POSITIVE (A) NONE DETECTED   MDMA (Ecstasy)Ur Screen NONE DETECTED NONE DETECTED   Cocaine Metabolite,Ur [place] NONE DETECTED NONE DETECTED   Opiate, Ur Screen NONE DETECTED NONE DETECTED   Phencyclidine (PCP) Ur S NONE DETECTED NONE DETECTED   Cannabinoid 50 Ng, Ur [place] NONE DETECTED NONE DETECTED   Barbiturates, Ur Screen NONE DETECTED NONE DETECTED   Benzodiazepine, Ur Scrn NONE DETECTED NONE DETECTED   Methadone Scn, Ur NONE DETECTED NONE DETECTED    Comment: (NOTE) Tricyclics + metabolites, urine    Cutoff 1000 ng/mL Amphetamines + metabolites, urine  Cutoff 1000 ng/mL MDMA (Ecstasy), urine              Cutoff 500 ng/mL Cocaine Metabolite, urine          Cutoff 300 ng/mL Opiate + metabolites, urine        Cutoff 300 ng/mL Phencyclidine (PCP), urine         Cutoff 25 ng/mL Cannabinoid, urine                 Cutoff 50 ng/mL Barbiturates + metabolites, urine  Cutoff 200 ng/mL Benzodiazepine, urine              Cutoff 200 ng/mL Methadone, urine                   Cutoff 300 ng/mL The urine drug screen provides only a preliminary, unconfirmed analytical test result and should  not be used for non-medical purposes. Clinical consideration and professional judgment should be applied to any positive drug screen result due to possible interfering substances. A more specific alternate chemical method must be used in order to obtain a confirmed  analytical result. Gas chromatography / mass spectrometry (GC/MS) is the preferred confirmat ory method. Performed at [facility], [address] [place], [place] [address]   Acetaminophen level     Status: Abnormal   Collection Time: 05/25/19 12:38 AM  Result Value Ref Range   Acetaminophen (Tylenol), Serum <10 (L) 10 - 30 ug/mL    Comment: (NOTE) Therapeutic concentrations vary significantly. A range of 10-30 ug/mL  may be an effective concentration for many patients. However, some  are best treated at concentrations outside of this range. Acetaminophen concentrations >150 ug/mL at 4 hours after ingestion  and >50 ug/mL at 12 hours after ingestion are often associated with  toxic reactions. Performed at [facility], [address] [place], [place] [id number]   Salicylate level     Status: Abnormal   Collection Time: 05/25/19 12:38 AM  Result Value Ref Range   Salicylate Lvl Q000111Q (L) 7.0 - 30.0 mg/dL    Comment: Performed at [facility], [address] [place], [place] [address]    Blood Alcohol level:  Lab Results  Component Value Date   [facility] <10 05/25/2019   ETH <10 123XX123    Metabolic Disorder Labs: No results found for: HGBA1C, MPG No results found for: PROLACTIN No results found for: CHOL, TRIG, HDL, CHOLHDL, VLDL, LDLCALC  Physical Findings: AIMS: Facial and Oral Movements Muscles of Facial Expression: None, normal Lips and Perioral Area: None, normal Jaw: None, normal Tongue: None, normal,Extremity Movements Upper (arms, wrists, hands, fingers): None, normal Lower (legs, knees, ankles, toes): None, normal, Trunk Movements Neck, shoulders, hips: None, normal, Overall Severity Severity of abnormal movements (highest score from questions above): None, normal Incapacitation due to abnormal movements: None, normal Patient's awareness of abnormal movements (rate only patient's report): No Awareness, Dental Status Current  problems with teeth and/or dentures?: No Does patient usually wear dentures?: No  CIWA:    COWS:     Musculoskeletal: Strength & Muscle Tone: within normal limits Gait & Station: normal Patient leans: N/A  Psychiatric Specialty Exam: Physical Exam  Nursing note and vitals reviewed. Constitutional: He appears well-developed and well-nourished.  HENT:  Head: Normocephalic and atraumatic.  Eyes: Pupils are equal, round, and reactive to light. Conjunctivae are normal.  Cardiovascular: Regular rhythm and normal heart sounds.  Respiratory: Effort normal.  GI: Soft.  Musculoskeletal:        General: Normal range of motion.     Cervical back: Normal range of motion.  Neurological: He is alert.  Skin: Skin is warm and dry.  Psychiatric: Judgment normal. His affect is blunt. His speech is delayed. He is slowed. Thought content is paranoid. Cognition and memory are normal. He expresses no homicidal and no suicidal ideation.    Review of Systems  Constitutional: Negative.   HENT: Negative.   Eyes: Negative.   Respiratory: Negative.   Cardiovascular: Negative.   Gastrointestinal: Negative.   Musculoskeletal: Negative.   Skin: Negative.   Neurological: Negative.   Psychiatric/Behavioral: The patient is nervous/anxious.     Blood pressure 115/72, pulse 78, temperature 99 F (37.2 C), temperature source Oral, resp. rate 18, height 5\' 11"  (1.803 m), weight 78.5 kg, SpO2 100 %.Body mass index is 24.13 kg/m.  General Appearance: Casual  Eye Contact:  Good  Speech:  Clear and Coherent  Volume:  Normal  Mood:  Euthymic  Affect:  Constricted  Thought Process:  Disorganized  Orientation:  Full (Time, Place, and Person)  Thought Content:  Illogical and Paranoid Ideation  Suicidal Thoughts:  No  Homicidal Thoughts:  No  Memory:  Immediate;   Fair Recent;   Fair Remote;   Fair  Judgement:  Impaired  Insight:  Shallow  Psychomotor Activity:  Restlessness  Concentration:   Concentration: Poor  Recall:  [employer] of Knowledge:  Fair  Language:  Fair  Akathisia:  No  Handed:  Right  AIMS (if indicated):     Assets:  Desire for Improvement Housing Physical Health  ADL's:  Impaired  Cognition:  Impaired,  Mild  Sleep:  Number of Hours: 0     Treatment Plan Summary: Daily contact with patient to assess and evaluate symptoms and progress in treatment, Medication management and Plan Orders have been placed to begin Risperdal twice a day.  Side effects and rationale explained to patient.  Assess overnight possible discharge tomorrow.  Alethia Berthold, MD 05/26/2019, 5:52 PM

## 2019-05-26 NOTE — Progress Notes (Signed)
Patient is quiet but polite. Appears to be responding to internal stimuli as he is looking through doors and checking behind himself.  Despite being observed responding, patient continues to deny SI/HI/AVH depression and anxiety. He has had to be redirected several times from going into other patients rooms. He is however redirectable at each encounter. Patient accepted and received Trazodone, and tolerated without incident. Patient has been restless and even after receiving the dose of Trazodone has complaints of not being able to sleep. Patient remains safe at this time with 15 minute safety checks. Patient informed to contact staff with any concerns.

## 2019-05-27 MED ORDER — RISPERIDONE 1 MG PO TABS: 1.0000 mg | ORAL_TABLET | Freq: Two times a day (BID) | ORAL | 1 refills | Status: DC

## 2019-05-27 MED ORDER — RISPERIDONE 1 MG PO TABS
1.0000 mg | ORAL_TABLET | Freq: Two times a day (BID) | ORAL | Status: DC
  Administered 2019-05-27: 1 mg via ORAL
  Filled 2019-05-27: qty 1

## 2019-05-27 NOTE — Progress Notes (Signed)
Patient denies SI/HI, denies A/V hallucinations. Patient verbalizes understanding of discharge instructions, follow up care and prescriptions.7 days medicines given to patient. Patient given all belongings from [facility] locker. Patient escorted out by staff, transported by family.

## 2019-05-27 NOTE — Discharge Summary (Signed)
Physician Discharge Summary Note  Patient:  Ethan Sutton is an 34 y.o., male MRN:  [id number] DOB:  09-16-1985 Patient phone:  [phone] (home)  Patient address:   [address] [place] [address],  Total Time spent with patient: 30 minutes  Date of Admission:  05/25/2019 Date of Discharge: May 27, 2019  Reason for Admission: Admitted because of new onset paranoia and psychosis possibly in the context of substance abuse  Principal Problem: Acute psychosis [facility]) Discharge Diagnoses: Principal Problem:   Acute psychosis ([place]) Active Problems:   Amphetamine abuse ([place])   Past Psychiatric History: Past history of substance abuse but also reportedly intermittent psychotic symptoms  Past Medical History:  Past Medical History:  Diagnosis Date  . Schizophrenia ([place])    History reviewed. No pertinent surgical history. Family History: History reviewed. No pertinent family history. Family Psychiatric  History: Positive for a extensive history of psychotic disorders in close relatives namely 2 brothers Social History:  Social History   Substance and Sexual Activity  Alcohol Use Not Currently     Social History   Substance and Sexual Activity  Drug Use Not Currently    Social History   Socioeconomic History  . Marital status: Single    Spouse name: Not on file  . Number of children: Not on file  . Years of education: Not on file  . Highest education level: Not on file  Occupational History  . Not on file  Tobacco Use  . Smoking status: Current Every Day Smoker    Types: Cigarettes  . Smokeless tobacco: Never Used  Substance and Sexual Activity  . Alcohol use: Not Currently  . Drug use: Not Currently  . Sexual activity: Not Currently    Birth control/protection: Condom, Abstinence  Other Topics Concern  . Not on file  Social History Narrative  . Not on file   Social Determinants of Health   Financial Resource Strain:   . Difficulty of Paying  Living Expenses:   Food Insecurity:   . Worried About Charity fundraiser in the Last Year:   . Arboriculturist in the Last Year:   Transportation Needs:   . Film/video editor (Medical):   Marland Kitchen Lack of Transportation (Non-Medical):   Physical Activity:   . Days of Exercise per Week:   . Minutes of Exercise per Session:   Stress:   . Feeling of Stress :   Social Connections:   . Frequency of Communication with Friends and Family:   . Frequency of Social Gatherings with Friends and Family:   . Attends Religious Services:   . Active Member of Clubs or Organizations:   . Attends Archivist Meetings:   Marland Kitchen Marital Status:     Hospital Course: Admitted to the hospital and continued on 15-minute checks.  Displayed no dangerous behavior.  Initially resistant he later agreed to medication and has attended some groups.  He has not shown any dangerous behavior on the unit.  At the time of discharge he is calm her and seems less paranoid and agitated.  He has been educated about psychotic disorder and encouraged to follow-up and stay on medication.  7-day supply will be provided at discharge as well as referral to [place] and prescriptions.  Physical Findings: AIMS: Facial and Oral Movements Muscles of Facial Expression: None, normal Lips and Perioral Area: None, normal Jaw: None, normal Tongue: None, normal,Extremity Movements Upper (arms, wrists, hands, fingers): None, normal Lower (legs, knees,  ankles, toes): None, normal, Trunk Movements Neck, shoulders, hips: None, normal, Overall Severity Severity of abnormal movements (highest score from questions above): None, normal Incapacitation due to abnormal movements: None, normal Patient's awareness of abnormal movements (rate only patient's report): No Awareness, Dental Status Current problems with teeth and/or dentures?: No Does patient usually wear dentures?: No  CIWA:    COWS:     Musculoskeletal: Strength & Muscle Tone: within  normal limits Gait & Station: normal Patient leans: N/A  Psychiatric Specialty Exam: Physical Exam  Nursing note and vitals reviewed. Constitutional: He appears well-developed and well-nourished.  HENT:  Head: Normocephalic and atraumatic.  Eyes: Pupils are equal, round, and reactive to light. Conjunctivae are normal.  Cardiovascular: Regular rhythm and normal heart sounds.  Respiratory: Effort normal. No respiratory distress.  GI: Soft.  Musculoskeletal:        General: Normal range of motion.     Cervical back: Normal range of motion.  Neurological: He is alert.  Skin: Skin is warm and dry.  Psychiatric: He has a normal mood and affect. His behavior is normal. Judgment and thought content normal.    Review of Systems  Constitutional: Negative.   HENT: Negative.   Eyes: Negative.   Respiratory: Negative.   Cardiovascular: Negative.   Gastrointestinal: Negative.   Musculoskeletal: Negative.   Skin: Negative.   Neurological: Negative.   Psychiatric/Behavioral: Negative.     Blood pressure (!) 120/58, pulse 78, temperature 99 F (37.2 C), temperature source Oral, resp. rate 17, height 5\' 11"  (1.803 m), weight 78.5 kg, SpO2 100 %.Body mass index is 24.13 kg/m.  General Appearance: Casual  Eye Contact:  Good  Speech:  Clear and Coherent  Volume:  Normal  Mood:  Euthymic  Affect:  Congruent  Thought Process:  Goal Directed  Orientation:  Full (Time, Place, and Person)  Thought Content:  Logical  Suicidal Thoughts:  No  Homicidal Thoughts:  No  Memory:  Immediate;   Fair Recent;   Fair Remote;   Fair  Judgement:  Fair  Insight:  Fair  Psychomotor Activity:  Normal  Concentration:  Concentration: Fair  Recall:  [employer] of Knowledge:  Fair  Language:  Fair  Akathisia:  No  Handed:  Right  AIMS (if indicated):     Assets:  Desire for Improvement  ADL's:  Intact  Cognition:  WNL  Sleep:  Number of Hours: 5.15        Has this patient used any form of  tobacco in the last 30 days? (Cigarettes, Smokeless Tobacco, Cigars, and/or Pipes) Yes, No  Blood Alcohol level:  Lab Results  Component Value Date   ETH <10 05/25/2019   ETH <10 123XX123    Metabolic Disorder Labs:  No results found for: HGBA1C, MPG No results found for: PROLACTIN No results found for: CHOL, TRIG, HDL, CHOLHDL, VLDL, LDLCALC  See Psychiatric Specialty Exam and Suicide Risk Assessment completed by Attending Physician prior to discharge.  Discharge destination:  Home  Is patient on multiple antipsychotic therapies at discharge:  No   Has Patient had three or more failed trials of antipsychotic monotherapy by history:  No  Recommended Plan for Multiple Antipsychotic Therapies: NA  Discharge Instructions    Diet - low sodium heart healthy   Complete by: As directed    Increase activity slowly   Complete by: As directed      Allergies as of 05/27/2019   No Known Allergies     Medication List  STOP taking these medications   aluminum-magnesium hydroxide-simethicone 200-200-20 MG/5ML Susp Commonly known as: MAALOX     TAKE these medications     Indication  famotidine 20 MG tablet Commonly known as: PEPCID Take 1 tablet (20 mg total) by mouth 2 (two) times daily.  Indication: Gastroesophageal Reflux Disease   risperiDONE 1 MG tablet Commonly known as: RISPERDAL Take 1 tablet (1 mg total) by mouth 2 (two) times daily.  Indication: Schizophrenia   traZODone 100 MG tablet Commonly known as: DESYREL Take 1 tablet (100 mg total) by mouth at bedtime as needed for sleep.  Indication: [place] Follow up.   Contact information: [place] [address] [phone]           Follow-up recommendations:  Activity:  Activity as tolerated Diet:  Regular diet Other:  Follow-up outpatient treatment as recommended  Comments: 7-day supply and prescriptions  provided  Signed: Alethia Berthold, MD 05/27/2019, 10:24 AM

## 2019-05-27 NOTE — Progress Notes (Signed)
  [facility] Adult Case Management Discharge Plan :  Will you be returning to the same living situation after discharge:  Yes,  pt lives with parent At discharge, do you have transportation home?: Yes,  pt reports friend will pick up Do you have the ability to pay for your medications: No.  Release of information consent forms completed and in the chart;  Patient's signature needed at discharge.  Patient to Follow up at: Follow-up Information    [place] [facility] Follow up on 06/03/2019.   Why: You are scheduled to meet with Sherrian Divers, peer support specialist via zoom on Thursday, May 6th at [place]. thank you. Contact information: [place] [address] [phone]           Next level of care provider has access to [place] and Suicide Prevention discussed: Yes,  with pt; declined family contact     Has patient been referred to the Quitline?: Patient refused referral  Patient has been referred for addiction treatment: N/A  Yvette Rack, LCSW 05/27/2019, 10:51 AM

## 2019-05-27 NOTE — Plan of Care (Signed)
  Problem: Education: Goal: Knowledge of [facility] Education information/materials will improve Outcome: Progressing Goal: Emotional status will improve Outcome: Progressing Goal: Mental status will improve Outcome: Progressing Goal: Verbalization of understanding the information provided will improve Outcome: Progressing   

## 2019-05-27 NOTE — Progress Notes (Signed)
Patient has been isolative. Denies SI HI and AVH. Appears internally preoccupied.

## 2019-05-27 NOTE — BHH Counselor (Signed)
Patient is refusing to sign for the Safe Transport form.  Patient is stating that he will have a ride and yet refuses to call for his ride.    Assunta Curtis, MSW, LCSW 05/27/2019 10:49 AM

## 2019-05-27 NOTE — BHH Suicide Risk Assessment (Signed)
[facility] Discharge Suicide Risk Assessment   Principal Problem: Acute psychosis ([place]) Discharge Diagnoses: Principal Problem:   Acute psychosis ([place]) Active Problems:   Amphetamine abuse ([place])   Total Time spent with patient: 30 minutes  Musculoskeletal: Strength & Muscle Tone: within normal limits Gait & Station: normal Patient leans: N/A  Psychiatric Specialty Exam: Review of Systems  Constitutional: Negative.   HENT: Negative.   Eyes: Negative.   Respiratory: Negative.   Cardiovascular: Negative.   Gastrointestinal: Negative.   Musculoskeletal: Negative.   Skin: Negative.   Neurological: Negative.   Psychiatric/Behavioral: Negative.     Blood pressure (!) 120/58, pulse 78, temperature 99 F (37.2 C), temperature source Oral, resp. rate 17, height 5\' 11"  (1.803 m), weight 78.5 kg, SpO2 100 %.Body mass index is 24.13 kg/m.  General Appearance: Casual  Eye Contact::  Fair  Speech:  Slow409  Volume:  Decreased  Mood:  Euthymic  Affect:  Constricted  Thought Process:  Coherent  Orientation:  Full (Time, Place, and Person)  Thought Content:  Logical  Suicidal Thoughts:  No  Homicidal Thoughts:  No  Memory:  Immediate;   Fair Recent;   Fair Remote;   Fair  Judgement:  Fair  Insight:  Fair  Psychomotor Activity:  Normal  Concentration:  Fair  Recall:  [employer] of Knowledge:Fair  Language: Fair  Akathisia:  No  Handed:  Right  AIMS (if indicated):     Assets:  Desire for Improvement Physical Health  Sleep:  Number of Hours: 5.15  Cognition: WNL  ADL's:  Intact   Mental Status Per Nursing Assessment::   On Admission:  NA  Demographic Factors:  Male and Living alone  Loss Factors: Decrease in vocational status  Historical Factors: Impulsivity  Risk Reduction Factors:   Sense of responsibility to family, Positive social support and Positive therapeutic relationship  Continued Clinical Symptoms:  Schizophrenia:   Paranoid or undifferentiated  type  Cognitive Features That Contribute To Risk:  None    Suicide Risk:  Minimal: No identifiable suicidal ideation.  Patients presenting with no risk factors but with morbid ruminations; may be classified as minimal risk based on the severity of the depressive symptoms  Follow-up Information    [place] Follow up.   Contact information: [place] [address] [phone]           Plan Of Care/Follow-up recommendations:  Activity:  Activity as tolerated Diet:  Regular diet Other:  Follow-up with outpatient treatment as recommended and continue current medicine  Alethia Berthold, MD 05/27/2019, 10:20 AM

## 2019-05-29 ENCOUNTER — Other Ambulatory Visit: Payer: Self-pay

## 2019-05-29 ENCOUNTER — Emergency Department
Admission: EM | Admit: 2019-05-29 | Discharge: 2019-05-30 | Disposition: A | Discharge status: Other Acute Inpatient Hospital | Payer: Self-pay | Attending: Emergency Medicine | Admitting: Emergency Medicine

## 2019-05-29 DIAGNOSIS — F17210 Nicotine dependence, cigarettes, uncomplicated: Secondary | ICD-10-CM | POA: Insufficient documentation

## 2019-05-29 DIAGNOSIS — F29 Unspecified psychosis not due to a substance or known physiological condition: Secondary | ICD-10-CM | POA: Insufficient documentation

## 2019-05-29 DIAGNOSIS — Z79899 Other long term (current) drug therapy: Secondary | ICD-10-CM | POA: Insufficient documentation

## 2019-05-29 DIAGNOSIS — F209 Schizophrenia, unspecified: Secondary | ICD-10-CM | POA: Diagnosis present

## 2019-05-29 DIAGNOSIS — Z20822 Contact with and (suspected) exposure to covid-19: Secondary | ICD-10-CM | POA: Insufficient documentation

## 2019-05-29 MED ORDER — TRAZODONE HCL 100 MG PO TABS: 100.0000 mg | ORAL_TABLET | Freq: Every day | ORAL | Status: DC

## 2019-05-29 NOTE — ED Notes (Signed)
Hourly rounding reveals patient asleep in room. No complaints, stable, in no acute distress. Q15 minute rounds and monitoring via [place] and Officer to continue.  

## 2019-05-29 NOTE — ED Notes (Signed)
Prior conversation with Dominica Severin and pt, pt agreed to allow blood draw and COVID swab as long as we could get pt something to ear and drink, RN Dominica Severin and pt agreed. This tech in to draw pt blood work and pt is refusing again stating "I was just here a day ago & I don't want that" RN Dominica Severin notified

## 2019-05-29 NOTE — ED Notes (Signed)
Pt coming out of his room wanting to know "why can't I go to the other room" pt is referring to the [facility] unit. This tech explained to pt that in order for him to go to the [facility] we need to have blood work done, it pt would agree to allow [name] to draw blood we could possibly move him over to the other unit. Pt stating to this writer "well I'll just sign myself out" this writer explained to pt that he is now IVC and he can't sign hisself out. Pt explains to this tech that his mother IVC 'd him and he wants the phone to call her to have her "undo it" this writer explained to pt that is not the way that it works and that the doctor will have to resend the IVC order. Pt becomes agitated with this tech and walks back to his room. Production assistant, radio with [employer] present for conversation

## 2019-05-29 NOTE — ED Notes (Signed)
Patient is calm but refusing to cooperate with blood work at this time. MD made aware.

## 2019-05-29 NOTE — ED Provider Notes (Addendum)
[facility] Emergency Department Provider Note  ____________________________________________  Time seen: Approximately 10:45 PM  I have reviewed the triage vital signs and the nursing notes.   HISTORY  Chief Complaint Paranoia   Level 5 Caveat: Portions of the History and Physical including HPI and review of systems are unable to be completely obtained due to patient being a poor historian   HPI Ethan Sutton is a 34 y.o. male with a history of schizophrenia who is brought to the ED due to mom taking out IVC papers on the patient for paranoia, thinking that people are watching him, carrying a knife around with him out of fear.  Patient denies any complaints at this time, reports he has not been taking his medication because it makes him feel strange.  No aggravating or alleviating factors, symptoms have been constant.  Denies fever or other acute pain complaints.      Past Medical History:  Diagnosis Date  . Schizophrenia [facility])      Patient Active Problem List   Diagnosis Date Noted  . Acute psychosis ([place]) 05/25/2019  . Amphetamine abuse ([place]) 05/25/2019  . Psychosis ([place]) 05/14/2019     History reviewed. No pertinent surgical history.   Prior to Admission medications   Medication Sig Start Date End Date Taking? Authorizing Provider  famotidine (PEPCID) 20 MG tablet Take 1 tablet (20 mg total) by mouth 2 (two) times daily. 05/01/19   Carrie Mew, MD  risperiDONE (RISPERDAL) 1 MG tablet Take 1 tablet (1 mg total) by mouth 2 (two) times daily. 05/27/19   Clapacs, Madie Reno, MD  traZODone (DESYREL) 100 MG tablet Take 1 tablet (100 mg total) by mouth at bedtime as needed for sleep. 05/26/19   Clapacs, Madie Reno, MD     Allergies Patient has no known allergies.   History reviewed. No pertinent family history.  Social History Social History   Tobacco Use  . Smoking status: Current Every Day Smoker    Types: Cigarettes  . Smokeless tobacco:  Never Used  Substance Use Topics  . Alcohol use: Not Currently  . Drug use: Not Currently    Review of Systems Level 5 Caveat: Portions of the History and Physical including HPI and review of systems are unable to be completely obtained due to patient being a poor historian   Constitutional:   No known fever.  ENT:   No rhinorrhea. Cardiovascular:   No chest pain or syncope. Respiratory:   No dyspnea or cough. Gastrointestinal:   Negative for abdominal pain, vomiting and diarrhea.  Musculoskeletal:   Negative for focal pain or swelling ____________________________________________   PHYSICAL EXAM:  VITAL SIGNS: ED Triage Vitals  Enc Vitals Group     BP 05/29/19 1605 121/77     Pulse Rate 05/29/19 1605 94     Resp 05/29/19 1605 16     Temp 05/29/19 1605 98.6 F (37 C)     Temp Source 05/29/19 1605 Oral     SpO2 05/29/19 1605 100 %     Weight 05/29/19 1606 175 lb (79.4 kg)     Height 05/29/19 1606 5\' 11"  (1.803 m)     Head Circumference --      Peak Flow --      Pain Score 05/29/19 1605 0     Pain Loc --      Pain Edu? --      Excl. in [place]? --     Vital signs reviewed, nursing assessments reviewed.  Constitutional:   Alert and oriented to person and place. Non-toxic appearance. Eyes:   Conjunctivae are normal. EOMI. PERRL. ENT      Head:   Normocephalic and atraumatic.      Nose:   No congestion/rhinnorhea.       Mouth/Throat:   MMM, no pharyngeal erythema. No peritonsillar mass.       Neck:   No meningismus. Full ROM. Hematological/Lymphatic/Immunilogical:   No cervical lymphadenopathy. Cardiovascular:   RRR. Symmetric bilateral radial and DP pulses.  No murmurs. Cap refill less than 2 seconds. Respiratory:   Normal respiratory effort without tachypnea/retractions. Breath sounds are clear and equal bilaterally. No wheezes/rales/rhonchi. Gastrointestinal:   Soft and nontender. Non distended. There is no CVA tenderness.  No rebound, rigidity, or  guarding.  Musculoskeletal:   Normal range of motion in all extremities. No joint effusions.  No lower extremity tenderness.  No edema. Neurologic:   Normal speech, restricted language, reserved affect.  Motor grossly intact. No acute focal neurologic deficits are appreciated.  Skin:    Skin is warm, dry and intact. No rash noted.  No petechiae, purpura, or bullae.  ____________________________________________    LABS (pertinent positives/negatives) (all labs ordered are listed, but only abnormal results are displayed) Labs Reviewed  RESPIRATORY PANEL BY RT PCR (FLU A&B, COVID)  COMPREHENSIVE METABOLIC PANEL  ETHANOL  URINE DRUG SCREEN, QUALITATIVE ([facility] ONLY)  CBC WITH DIFFERENTIAL/PLATELET   ____________________________________________   EKG    ____________________________________________    RADIOLOGY  No results found.  ____________________________________________   PROCEDURES Procedures  ____________________________________________    CLINICAL IMPRESSION / ASSESSMENT AND PLAN / ED COURSE  Medications ordered in the ED: Medications - No data to display  Pertinent labs & imaging results that were available during my care of the patient were reviewed by me and considered in my medical decision making (see chart for details).   Ethan Sutton was evaluated in Emergency Department on 05/29/2019 for the symptoms described in the history of present illness. He was evaluated in the context of the global COVID-19 pandemic, which necessitated consideration that the patient might be at risk for infection with the SARS-CoV-2 virus that causes COVID-19. Institutional protocols and algorithms that pertain to the evaluation of patients at risk for COVID-19 are in a state of rapid change based on information released by regulatory bodies including the CDC and federal and state organizations. These policies and algorithms were followed during the patient's care in the ED.    Patient presents with symptoms of schizophrenia.  Continue IVC, consult psychiatry.  The patient has been placed in psychiatric observation due to the need to provide a safe environment for the patient while obtaining psychiatric consultation and evaluation, as well as ongoing medical and medication management to treat the patient's condition.  The patient has been placed under full IVC at this time.   ----------------------------------------- 10:51 PM on 05/29/2019 -----------------------------------------  Case discussed with psychiatry nurse practitioner who advises patient will need psychiatric hospitalization for further stabilization.      ____________________________________________   FINAL CLINICAL IMPRESSION(S) / ED DIAGNOSES    Final diagnoses:  Schizophrenia, unspecified type [facility])     ED Discharge Orders    None      Portions of this note were generated with dragon dictation software. Dictation errors may occur despite best attempts at proofreading.   Carrie Mew, MD 05/29/19 2252    Carrie Mew, MD 05/29/19 2252

## 2019-05-29 NOTE — ED Triage Notes (Signed)
Pt arrives to ER under IVC with [facility]. Dx schizoprenia this week. Mom took out IVC papers d/t pt refusing to take his meds and thinking someone is in the shed out back that are watching him. Papers also state that pt usually carries a knife but sheriff states he searched him and didn't find a knife on him.   Halen Kilby [phone] would like nurse to call her to speak with her about story.   Pt A&O, ambulatory. Denies SI and HI. Pt states "its the med" states he can't breath after he takes the new medicine.   Pt refusing to have blood drawn at this time. Informed that blood work is drawn to make sure nothing is medically wrong with pt. Pt states "I just don't want to have it drawn now." pt states "I haven't done anything." informed that blood work doesn't show any drugs but pt still doesn't want blood work drawn at this time. Informed that drawing blood now will hurry the process along but pt remains adamant about not having blood drawn in triage. States he will wait until he is in the back.

## 2019-05-29 NOTE — BH Assessment (Signed)
Assessment Note  Ethan Sutton is an 34 y.o. male presenting to [facility] ED under IVC petitioned by his mother. Per triage note Pt arrives to ER under IVC with [facility]. Dx schizoprenia this week. Mom took out IVC papers d/t pt refusing to take his meds and thinking someone is in the shed out back that are watching him. Papers also state that pt usually carries a knife but sheriff states he searched him and didn't find a knife on him. Drex Piersol [phone]- would like nurse to call her to speak with her about story. Pt A&O, ambulatory. Denies SI and HI. Pt states "its the med" states he can't breath after he takes the new medicine. Pt refusing to have blood drawn at this time. Informed that blood work is drawn to make sure nothing is medically wrong with pt. Pt states "I just don't want to have it drawn now." pt states "I haven't done anything." informed that blood work doesn't show any drugs but pt still doesn't want blood work drawn at this time. Informed that drawing blood now will hurry the process along but pt remains adamant about not having blood drawn in triage. States he will wait until he is in the back. During assessment patient was alert and oriented x3, calm and cooperative. Patient affect was flat, speech was pressured and soft. Patient reported why he was presenting to ED "nothing just my medicine, I'm having side effects, I guess that's why I'm here for my Schizophrenia." Patient reports side effects of his medication "it's hard to breath." When asked if patient feels as though someone is watching him patient denied. Patient denies SI/HI/AH/VH. Patient reports that his sleep has been "off and on" and reports his appetite is "okay." Patient denies using any mind altering substances.   Per Psyc NP patient is recommended for Inpatient Hospitalization   Diagnosis: Schizophrenia  Past Medical History:  Past Medical History:  Diagnosis Date  . Schizophrenia ([place])     History reviewed.  No pertinent surgical history.  Family History: History reviewed. No pertinent family history.  Social History:  reports that he has been smoking cigarettes. He has never used smokeless tobacco. He reports previous alcohol use. He reports previous drug use.  Additional Social History:  Alcohol / Drug Use Pain Medications: See MAR Prescriptions: See MAR Over the Counter: See MAR History of alcohol / drug use?: Yes Substance #1 Name of Substance 1: Methamphetamine  CIWA: CIWA-Ar BP: 121/77 Pulse Rate: 94 COWS:    Allergies: No Known Allergies  Home Medications: (Not in a hospital admission)   OB/GYN Status:  No LMP for male patient.  General Assessment Data Location of Assessment: [facility] ED TTS Assessment: In system Is this a Tele or Face-to-Face Assessment?: Face-to-Face Is this an Initial Assessment or a Re-assessment for this encounter?: Initial Assessment Patient Accompanied by:: N/A Language Other than English: No Living Arrangements: Other (Comment)(Private Residence) What gender do you identify as?: Male Marital status: Single Maiden name: . Living Arrangements: Parent Can pt return to current living arrangement?: Yes Admission Status: Involuntary Petitioner: Family member Is patient capable of signing voluntary admission?: No Referral Source: Self/Family/Friend Insurance type: None  Medical Screening Exam ([place]) Medical Exam completed: Yes  Crisis Care Plan Living Arrangements: Parent Legal Guardian: Other:(Self) Name of Psychiatrist: None Name of Therapist: None  Education Status Is patient currently in school?: No Highest grade of school patient has completed: 12th Is the patient employed, unemployed or receiving disability?: Unemployed  Risk to self with the past 6 months Suicidal Ideation: No Has patient been a risk to self within the past 6 months prior to admission? : No Suicidal Intent: No Has patient had any suicidal intent within  the past 6 months prior to admission? : No Is patient at risk for suicide?: No Suicidal Plan?: No Has patient had any suicidal plan within the past 6 months prior to admission? : No Access to Means: No What has been your use of drugs/alcohol within the last 12 months?: Methamphatmine use Previous Attempts/Gestures: No How many times?: 0 Other Self Harm Risks: History of substance use Triggers for Past Attempts: None known Intentional Self Injurious Behavior: None Family Suicide History: Unknown Recent stressful life event(s): Other (Comment)(Unknown) Persecutory voices/beliefs?: No Depression: Yes Depression Symptoms: Loss of interest in usual pleasures, Isolating Substance abuse history and/or treatment for substance abuse?: Yes Suicide prevention information given to non-admitted patients: Not applicable  Risk to Others within the past 6 months Homicidal Ideation: No Does patient have any lifetime risk of violence toward others beyond the six months prior to admission? : No Thoughts of Harm to Others: No Current Homicidal Intent: No Current Homicidal Plan: No Access to Homicidal Means: No Identified Victim: None History of harm to others?: No Assessment of Violence: None Noted Violent Behavior Description: None Does patient have access to weapons?: No Criminal Charges Pending?: No Does patient have a court date: No Is patient on probation?: No  Psychosis Hallucinations: None noted Delusions: Persecutory  Mental Status Report Appearance/Hygiene: In scrubs Eye Contact: Fair Motor Activity: Freedom of movement Speech: Logical/coherent, Pressured Level of Consciousness: Alert Mood: Anxious Affect: Appropriate to circumstance, Anxious Anxiety Level: Moderate Thought Processes: Coherent Judgement: Partial Orientation: Person, Place, Time Obsessive Compulsive Thoughts/Behaviors: None  Cognitive Functioning Concentration: Normal Memory: Remote Intact, Recent  Impaired Is patient IDD: No Insight: Fair Impulse Control: Fair Appetite: Good Have you had any weight changes? : No Change Sleep: Decreased Total Hours of Sleep: 3 Vegetative Symptoms: None  ADLScreening [facility] Assessment Services) Patient's cognitive ability adequate to safely complete daily activities?: Yes Patient able to express need for assistance with ADLs?: Yes Independently performs ADLs?: Yes (appropriate for developmental age)  Prior Inpatient Therapy Prior Inpatient Therapy: Yes Prior Therapy Dates: 05/25/2019 Prior Therapy Facilty/Provider(s): [facility] BMU Reason for Treatment: Paranoid Delusion  Prior Outpatient Therapy Prior Outpatient Therapy: No Does patient have an ACCT team?: No Does patient have Intensive In-House Services?  : No Does patient have [facility] services? : No Does patient have P4CC services?: No  ADL Screening (condition at time of admission) Patient's cognitive ability adequate to safely complete daily activities?: Yes Is the patient deaf or have difficulty hearing?: No Does the patient have difficulty seeing, even when wearing glasses/contacts?: No Does the patient have difficulty concentrating, remembering, or making decisions?: No Patient able to express need for assistance with ADLs?: Yes Does the patient have difficulty dressing or bathing?: No Independently performs ADLs?: Yes (appropriate for developmental age) Does the patient have difficulty walking or climbing stairs?: No Weakness of Legs: None Weakness of Arms/Hands: None  Home Assistive Devices/Equipment Home Assistive Devices/Equipment: None  Therapy Consults (therapy consults require a physician order) PT Evaluation Needed: No OT Evalulation Needed: No SLP Evaluation Needed: No Abuse/Neglect Assessment (Assessment to be complete while patient is alone) Abuse/Neglect Assessment Can Be Completed: Unable to assess, patient is non-responsive or altered mental status Values /  Beliefs Cultural Requests During Hospitalization: None Spiritual Requests During Hospitalization: None Consults Spiritual  Care Consult Needed: No Transition of Care Team Consult Needed: No Advance Directives (For Healthcare) Does Patient Have a Medical Advance Directive?: No Would patient like information on creating a medical advance directive?: No - Patient declined          Disposition: Per Psyc NP patient is recommended for Inpatient Hospitalization Disposition Initial Assessment Completed for this Encounter: Yes  On Site Evaluation by:   Reviewed with Physician:    Leonie Douglas MS Semmes 05/29/2019 11:12 PM

## 2019-05-29 NOTE — ED Notes (Signed)
Hourly rounding reveals patient awake in room. No complaints, stable, in no acute distress. Q15 minute rounds and monitoring via Rover and Officer to continue.  

## 2019-05-29 NOTE — Consult Note (Signed)
[facility] Psychiat[facility] Consult   Reason for Consult:  Psych evaluation  Referring Physician:  Dr. Joni Fears  Patient Identification: Ethan Sutton MRN:  [id number] Principal Diagnosis: Psychosis [facility]) Diagnosis:  Principal Problem:   Psychosis ([place])   Total Time spent with patient: 33 minutes  Subjective:   Ethan Sutton is a 34 y.o. male patient admitted with   HPI: Ethan Sutton, 34 y.o., male patient seen face to face  by this provider; chart reviewed and consulted with Dr. Dwyane Dee on 05/30/19.  On evaluation Ethan Sutton reports  Per triage note Pt arrives to ER under IVC with [facility]. Dx schizoprenia this week. Mom took out IVC papers d/t pt refusing to take his meds and thinking someone is in the shed out back that are watching him. Papers also state that pt usually carries a knife but sheriff states he searched him and didn't find a knife on him.   Denies SI and HI. Pt states "its the med" states he can't breath after he takes the new medicine. Pt refusing to have blood drawn at this time. Informed that blood work is drawn to make sure nothing is medically wrong with pt. Pt states "I just don't want to have it drawn now." pt states "I haven't done anything." informed that blood work doesn't show any drugs but pt still doesn't want blood work drawn at this time. Informed that drawing blood now will hurry the process along but pt remains adamant about not having blood drawn in triage. States he will wait until he is in the back.During assessment patient was alert and oriented x3, calm and cooperative. Per TTS Patient affect was flat, speech was pressured and soft. Patient reported why he was presenting to ED "nothing just my medicine, I'm having side effects, I guess that's why I'm here for my Schizophrenia." Patient reports side effects of his medication "it's hard to breath." When asked if patient feels as though someone is watching him patient denied. Patient denies SI/HI/AH/VH.  Patient reports that his sleep has been "off and on" and reports his appetite is "okay."  Patient presents "ok" denying SI, HI, and paranoia at this time. However Patient admits to not taking his medication and mom reports paranoia and that the patient carries a knife around for protection. It is these reasons writer is recommending inpatient hospitalization for medication modification and or adjustments.  During evaluation Ethan Sutton is laying in bed,  he is alert/oriented x3 ; calm/cooperative; and mood congruent with affect.  Patient is speaking in a clear tone at moderate volume, and normal pace; with poor eye contact.   There is no indication that he is currently responding to internal/external stimuli or experiencing delusional thought content at this time.  Patient denies suicidal/self-harm/homicidal ideation and paranoia.     Past Psychiatric History: psychosis   Risk to Self: Suicidal Ideation: No Suicidal Intent: No Is patient at risk for suicide?: No Suicidal Plan?: No Access to Means: No What has been your use of drugs/alcohol within the last 12 months?: Methamphatmine use How many times?: 0 Other Self Harm Risks: History of substance use Triggers for Past Attempts: None known Intentional Self Injurious Behavior: None Risk to Others: Homicidal Ideation: No Thoughts of Harm to Others: No Current Homicidal Intent: No Current Homicidal Plan: No Access to Homicidal Means: No Identified Victim: None History of harm to others?: No Assessment of Violence: None Noted Violent Behavior Description: None Does patient have access to weapons?: No Criminal Charges Pending?: No  Does patient have a court date: No Prior Inpatient Therapy: Prior Inpatient Therapy: Yes Prior Therapy Dates: 33/27/2021 Prior Therapy Facilty/Provider(s): [facility] BMU Reason for Treatment: Paranoid Delusion Prior Outpatient Therapy: Prior Outpatient Therapy: No Does patient have an ACCT team?: No Does patient  have Intensive In-House Services?  : No Does patient have [facility] services? : No Does patient have P4CC services?: No  Past Medical History:  Past Medical History:  Diagnosis Date  . Schizophrenia ([place])    History reviewed. No pertinent surgical history. Family History: History reviewed. No pertinent family history. Family Psychiatric  History: unknown Social History:  Social History   Substance and Sexual Activity  Alcohol Use Not Currently     Social History   Substance and Sexual Activity  Drug Use Not Currently    Social History   Socioeconomic History  . Marital status: Single    Spouse name: Not on file  . Number of children: Not on file  . Years of education: Not on file  . Highest education level: Not on file  Occupational History  . Not on file  Tobacco Use  . Smoking status: Current Every Day Smoker    Types: Cigarettes  . Smokeless tobacco: Never Used  Substance and Sexual Activity  . Alcohol use: Not Currently  . Drug use: Not Currently  . Sexual activity: Not Currently    Birth control/protection: Condom, Abstinence  Other Topics Concern  . Not on file  Social History Narrative  . Not on file   Social Determinants of Health   Financial Resource Strain:   . Difficulty of Paying Living Expenses:   Food Insecurity:   . Worried About Charity fundraiser in the Last Year:   . Arboriculturist in the Last Year:   Transportation Needs:   . Film/video editor (Medical):   Marland Kitchen Lack of Transportation (Non-Medical):   Physical Activity:   . Days of Exercise per Week:   . Minutes of Exercise per Session:   Stress:   . Feeling of Stress :   Social Connections:   . Frequency of Communication with Friends and Family:   . Frequency of Social Gatherings with Friends and Family:   . Attends Religious Services:   . Active Member of Clubs or Organizations:   . Attends Archivist Meetings:   Marland Kitchen Marital Status:    Additional Social History:     Allergies:  No Known Allergies  Labs: No results found for this or any previous visit (from the past 48 hour(s)).  Current Facility-Administered Medications  Medication Dose Route Frequency Provider Last Rate Last Admin  . traZODone (DESYREL) tablet 100 mg  100 mg Oral QHS Kristinia Leavy, Ernst Bowler, NP       Current Outpatient Medications  Medication Sig Dispense Refill  . famotidine (PEPCID) 20 MG tablet Take 1 tablet (20 mg total) by mouth 2 (two) times daily. 60 tablet 0  . risperiDONE (RISPERDAL) 1 MG tablet Take 1 tablet (1 mg total) by mouth 2 (two) times daily. 60 tablet 1  . traZODone (DESYREL) 100 MG tablet Take 1 tablet (100 mg total) by mouth at bedtime as needed for sleep. 7 tablet 0    Musculoskeletal: Strength & Muscle Tone: within normal limits Gait & Station: normal Patient leans: N/A  Psychiatric Specialty Exam: Physical Exam  Nursing note and vitals reviewed. Constitutional: He appears well-developed.  HENT:  Head: Normocephalic.  Eyes: Pupils are equal, round, and reactive to light.  Respiratory: Effort normal.  Musculoskeletal:        General: Normal range of motion.     Cervical back: Normal range of motion.  Neurological: He is alert.  Skin: Skin is warm and dry.    Review of Systems  Psychiatric/Behavioral: Positive for confusion. Negative for self-injury and suicidal ideas.  All other systems reviewed and are negative.   Blood pressure 121/77, pulse 94, temperature 98.6 F (37 C), temperature source Oral, resp. rate 16, height 5\' 11"  (1.803 m), weight 79.4 kg, SpO2 100 %.Body mass index is 24.41 kg/m.  General Appearance: Casual  Eye Contact:  Minimal  Speech:  Clear and Coherent  Volume:  Normal  Mood:  Dysphoric  Affect:  Flat  Thought Process:  Disorganized and Descriptions of Associations: Circumstantial  Orientation:  NA  Thought Content:  Illogical  Suicidal Thoughts:  No  Homicidal Thoughts:  No  Memory:  Recent;   Poor  Judgement:   Impaired  Insight:  Lacking  Psychomotor Activity:  Normal  Concentration:  Attention Span: Fair  Recall:  [employer] of Knowledge:  Fair  Language:  Fair  Akathisia:  NA  Handed:  Right  AIMS (if indicated):     Assets:  Social Support  ADL's:  Intact  Cognition:  Impaired,  Mild  Sleep:        Treatment Plan Summary: Daily contact with patient to assess and evaluate symptoms and progress in treatment, Medication management and Inpatient hospitalization and medication restart and psychiatric stabilization   Disposition: Recommend psychiatric Inpatient admission when medically cleared. Supportive therapy provided about ongoing stressors.  Deloria Lair, NP 05/29/2019 11:59 PM

## 2019-05-29 NOTE — ED Notes (Signed)
Pt changed out in triage, calm and cooperative for change out. Scott EDT and sheriff that brought pt in remained in room with this RN.   Black/white sweatshirt, black shoes, blue hospital socks, 1 gray shirt, 1 black shirt.   Pt belongings bagged in 1 bag and labeled with pt label.

## 2019-05-29 NOTE — ED Notes (Signed)
Report to include situation, background, assessment and recommendations from [facility]. Patient sleeping, respirations regular and unlabored. Q15 minute rounds and Officer observation to continue.

## 2019-05-30 DIAGNOSIS — F209 Schizophrenia, unspecified: Secondary | ICD-10-CM

## 2019-05-30 DIAGNOSIS — F250 Schizoaffective disorder, bipolar type: Secondary | ICD-10-CM

## 2019-05-30 LAB — RESPIRATORY PANEL BY RT PCR (FLU A&B, COVID)
Influenza A by PCR: NEGATIVE
Influenza B by PCR: NEGATIVE
SARS Coronavirus 2 by RT PCR: NEGATIVE

## 2019-05-30 NOTE — ED Notes (Signed)
Hourly rounding reveals patient sleeping in room. No complaints, stable, in no acute distress. Q15 minute rounds and monitoring via Rover and Officer to continue.  

## 2019-05-30 NOTE — ED Notes (Signed)
C-Com called for [facility] transport to [facility]

## 2019-05-30 NOTE — BH Assessment (Addendum)
Referral information for Psychiatric Hospitalization faxed to;   Marland Kitchen [facility] [phone]), Denied due to no insurance  . [facility] [phone]), No intake staff currently available as of 05/30/19 3:48am  . [facility] [phone] -or- [phone]), Denied due to no insurance  . [facility] [phone]) Re-faxed referral on 05/30/19 at 4:00am   [facility] [phone]) Re-faxed referral on 05/30/19 at 4:07am

## 2019-05-30 NOTE — ED Notes (Signed)
Attempted to call report to [facility]. No answer.

## 2019-05-30 NOTE — ED Notes (Signed)
RN attempted to call report. No answer.

## 2019-05-30 NOTE — BH Assessment (Signed)
Patient has been accepted to [facility].  Patient assigned to main campus Accepting physician is Dr. Jonelle Sports.  Call report to [phone].  Representative was [employer].   ER Staff is aware of it:  Nitchia, ER Secretary  Dr. Corky Downs, ER MD  Amy B., Patient's Nurse

## 2019-05-30 NOTE — ED Notes (Signed)
Pt discharged under IVC to [facility]. VS stable.  All belongings given to officer.  Pt cooperative.  RN documented attempts to call report.

## 2019-06-12 ENCOUNTER — Other Ambulatory Visit: Payer: Self-pay

## 2019-06-12 ENCOUNTER — Emergency Department
Admission: EM | Admit: 2019-06-12 | Discharge: 2019-06-12 | Disposition: A | Discharge status: Home / Self Care | Payer: Self-pay | Attending: Emergency Medicine | Admitting: Emergency Medicine

## 2019-06-12 DIAGNOSIS — F209 Schizophrenia, unspecified: Secondary | ICD-10-CM | POA: Insufficient documentation

## 2019-06-12 DIAGNOSIS — F17210 Nicotine dependence, cigarettes, uncomplicated: Secondary | ICD-10-CM | POA: Insufficient documentation

## 2019-06-12 DIAGNOSIS — Z79899 Other long term (current) drug therapy: Secondary | ICD-10-CM | POA: Insufficient documentation

## 2019-06-12 DIAGNOSIS — Z8659 Personal history of other mental and behavioral disorders: Secondary | ICD-10-CM | POA: Insufficient documentation

## 2019-06-12 NOTE — ED Provider Notes (Signed)
[place] EMERGENCY DEPARTMENT Provider Note   CSN: [id number] Arrival date & time: 06/12/19  1754     History Chief Complaint  Patient presents with  . Psychiatric Evaluation    Ethan Sutton is a 34 y.o. male history of schizophrenia here presenting with IVC.  Per the IVC paperwork, patient was just sitting on the side of the road.  Patient adamantly denies any suicidal homicidal ideation.  States that he feels fine.  He denies any hallucinations.  He was recently seen by psychiatry about 2 weeks ago and eventually cleared to go home.  Patient denies any drug or alcohol overdose.  Patient does have a history of amphetamine use.  He states that he does not want to kill himself or others right now.  He is asking for something to eat and a ride home.  The history is provided by the patient.       Past Medical History:  Diagnosis Date  . Schizophrenia [facility])     Patient Active Problem List   Diagnosis Date Noted  . Acute psychosis ([place]) 05/25/2019  . 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 Psychosis ([place]) 05/14/2019    History reviewed. No pertinent surgical history.     History reviewed. No pertinent family history.  Social History   Tobacco Use  . Smoking status: Current Every Day Smoker    Types: Cigarettes  . Smokeless tobacco: Never Used  Substance Use Topics  . Alcohol use: Not Currently  . Drug use: Not Currently    Home Medications Prior to Admission medications   Medication Sig Start Date End Date Taking? Authorizing Provider  risperiDONE (RISPERDAL) 1 MG tablet Take 1 tablet (1 mg total) by mouth 2 (two) times daily. 05/27/19   Clapacs, Madie Reno, MD  traZODone (DESYREL) 100 MG tablet Take 1 tablet (100 mg total) by mouth at bedtime as needed for sleep. 05/26/19   Clapacs, Madie Reno, MD    Allergies    Patient has no known allergies.  Review of Systems   Review of Systems  Psychiatric/Behavioral: Negative for agitation, behavioral  problems, hallucinations and suicidal ideas.  All other systems reviewed and are negative.   Physical Exam Updated Vital Signs BP (!) 142/84   Pulse (!) 110   Temp 98.4 F (36.9 C) (Oral)   Resp 18   Ht 5\' 11"  (1.803 m)   Wt 80 kg   SpO2 100%   BMI 24.60 kg/m   Physical Exam Vitals and nursing note reviewed.  HENT:     Head: Normocephalic.     Nose: Nose normal.     Mouth/Throat:     Mouth: Mucous membranes are moist.  Eyes:     Extraocular Movements: Extraocular movements intact.     Pupils: Pupils are equal, round, and reactive to light.  Cardiovascular:     Rate and Rhythm: Normal rate.     Pulses: Normal pulses.  Pulmonary:     Effort: Pulmonary effort is normal.     Breath sounds: Normal breath sounds.  Abdominal:     General: Abdomen is flat.  Musculoskeletal:        General: Normal range of motion.     Cervical back: Normal range of motion.  Skin:    General: Skin is warm.     Capillary Refill: Capillary refill takes less than 2 seconds.  Neurological:     General: No focal deficit present.     Mental Status: He is alert.  Psychiatric:  Mood and Affect: Mood normal.     Comments: Appropriate, calm      ED Results / Procedures / Treatments   Labs (all labs ordered are listed, but only abnormal results are displayed) Labs Reviewed  COMPREHENSIVE METABOLIC PANEL  ETHANOL  SALICYLATE LEVEL  ACETAMINOPHEN LEVEL  CBC  URINE DRUG SCREEN, QUALITATIVE ([place])    EKG None  Radiology No results found.  Procedures Procedures (including critical care time)  Medications Ordered in ED Medications - No data to display  ED Course  I have reviewed the triage vital signs and the nursing notes.  Pertinent labs & imaging results that were available during my care of the patient were reviewed by me and considered in my medical decision making (see chart for details).    MDM Rules/Calculators/A&P                      Ethan Sutton is a 34  y.o. male history of schizophrenia here under IVC.  Per the IVC paperwork, patient was just sitting on the side of the road.  He previously was feeling people was watching him but he denies that right now.  Denies any hallucinations.  Denies any suicidal or homicidal ideation.  I do not think he needs to be IVC right now.  I rescinded the IVC paperwork.  He is known not to take his medicine so I encouraged him to take his medicines as prescribed and follow-up with his doctor.  Final Clinical Impression(s) / ED Diagnoses Final diagnoses:  None    Rx / DC Orders ED Discharge Orders    None       Drenda Freeze, MD 06/12/19 [phone]

## 2019-06-12 NOTE — Discharge Instructions (Addendum)
You need to take your medicines as prescribed for your schizophrenia.  Please see your counselor.  Return to ER if you have thoughts of harming yourself or others or hallucinations.  Please avoid any drugs or alcohol.

## 2019-06-12 NOTE — ED Notes (Signed)
Pt belongings include white tshirt, one pair jeans, two socks, one blue and white belt, one pair gray shorts, one pair red underwear, one lighter.

## 2019-06-12 NOTE — ED Triage Notes (Signed)
FIRST NURSE NOTE: Pt here under IVC with [employer], pt arrived in hand cuffs, pt talking about wanting to get his medications checked and go home. Pt frequently tightening up arms behind his back, officer has arm on his arm to keep him calm.

## 2019-06-12 NOTE — ED Notes (Signed)
Pt calm and cooperative but refusing blood work at this time. Taken to [employer] with Garment/textile technologist.

## 2019-06-12 NOTE — ED Triage Notes (Signed)
Pt comes with [facility] sheriff IVC'd from sitting in the middle of the road. Pt has history of schizophrenia and SI attempts. Pt states "nothing, I was just swimming".

## 2019-12-01 ENCOUNTER — Encounter: Payer: Self-pay | Admitting: Emergency Medicine

## 2019-12-01 ENCOUNTER — Other Ambulatory Visit: Payer: Self-pay

## 2019-12-01 ENCOUNTER — Emergency Department
Admission: EM | Admit: 2019-12-01 | Discharge: 2019-12-01 | Disposition: A | Discharge status: Home / Self Care | Payer: Self-pay | Attending: Emergency Medicine | Admitting: Emergency Medicine

## 2019-12-01 DIAGNOSIS — X58XXXD Exposure to other specified factors, subsequent encounter: Secondary | ICD-10-CM | POA: Insufficient documentation

## 2019-12-01 DIAGNOSIS — F17210 Nicotine dependence, cigarettes, uncomplicated: Secondary | ICD-10-CM | POA: Insufficient documentation

## 2019-12-01 DIAGNOSIS — S81812D Laceration without foreign body, left lower leg, subsequent encounter: Secondary | ICD-10-CM | POA: Insufficient documentation

## 2019-12-01 DIAGNOSIS — Z4802 Encounter for removal of sutures: Secondary | ICD-10-CM

## 2019-12-01 NOTE — ED Notes (Signed)
Pt here for suture removal

## 2019-12-01 NOTE — ED Provider Notes (Signed)
[facility] Emergency Department Provider Note  ________________  Time seen: 02:58  I have reviewed the triage vital signs and the nursing notes.   HISTORY  Chief Complaint Suture / Staple Removal   HPI  Ethan Sutton is a 34 y.o. male who presents to the ER  for suture removal.  Patient denies any complaints or concerns.  Past Medical History:  Diagnosis Date  . Schizophrenia [facility])     Patient Active Problem List   Diagnosis Date Noted  . Acute psychosis ([place]) 05/25/2019  . 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 Psychosis ([place]) 05/14/2019    History reviewed. No pertinent surgical history.  Current Outpatient Rx  . Order #: [id number] Class: Print  . Order #: [id number] Class: Normal    Allergies Patient has no known allergies.  History reviewed. No pertinent family history.  Social History Social History   Tobacco Use  . Smoking status: Current Every Day Smoker    Types: Cigarettes  . Smokeless tobacco: Never Used  Vaping Use  . Vaping Use: Never used  Substance Use Topics  . Alcohol use: Not Currently  . Drug use: Not Currently    Review of Systems  Constitutional: Denies fever.  HEENT: No change from baseline Respiratory: No cough or shortness of breath Musculoskeletal: No pain. Skin: healing wound; pain gradually resolving.  ____________________________________________   PHYSICAL EXAM:  VITAL SIGNS: ED Triage Vitals  Enc Vitals Group     BP 12/01/19 1351 110/64     Pulse Rate 12/01/19 1351 79     Resp 12/01/19 1351 16     Temp 12/01/19 1351 98.7 F (37.1 C)     Temp Source 12/01/19 1351 Oral     SpO2 12/01/19 1351 100 %     Weight 12/01/19 1343 170 lb (77.1 kg)     Height 12/01/19 1343 6' (1.829 m)     Head Circumference --      Peak Flow --      Pain Score 12/01/19 1343 5     Pain Loc --      Pain Edu? --      Excl. in [place]? --     Constitutional: Appears well. No distress HEENT: Atraumtaic, normal  appearance, sclera normal, voice normal. Respiratory: Respirations even and unlabored.  Cardiovascular: Capillary refill normal. Peripheral pulses 2+ Musculoskeletal: Full ROM x 4. Skin: Well approximated. No evidence of infection or cellulitis. Neurovascular: Gait steady; Alert and oriented x 4.   PROCEDURES  Procedure(s) performed: SUTURE REMOVAL Performed by: Marylyn Ishihara, PA-S  Location details: Left lower extremity  Wound Appearance: clean  Sutures/Staples Removed: 16  Facility: sutures placed in this facility  Patient tolerance: Patient tolerated the procedure well with no immediate complications.    ____________________________________________   INITIAL IMPRESSION / ASSESSMENT AND PLAN / ED COURSE  Pertinent labs & imaging results that were available during my care of the patient were reviewed by me and considered in my medical decision making (see chart for details).   Wound care discussed. Patient advised to keep covered with sunscreen. Patient was advised to return to the ER for symptoms that change or worsen if unable to schedule an appointment with primary care.  ____________________________________________   FINAL CLINICAL IMPRESSION(S) / ED DIAGNOSES  Final diagnoses:  Visit for suture removal      Victorino Dike, FNP 12/06/19 1614    Lavonia Drafts, MD 12/09/19 1235

## 2019-12-01 NOTE — ED Triage Notes (Signed)
Pt comes into the ED via POV c/o suture removal.  Denies any problems and patient in NAd.

## 2021-09-22 IMAGING — CR DG CHEST 2V
2 series · 2 of 2 positions shown · non-contrast
Comparison: None.

CLINICAL DATA: Chest pain

EXAM:
CHEST - 2 VIEW

[chest pa]
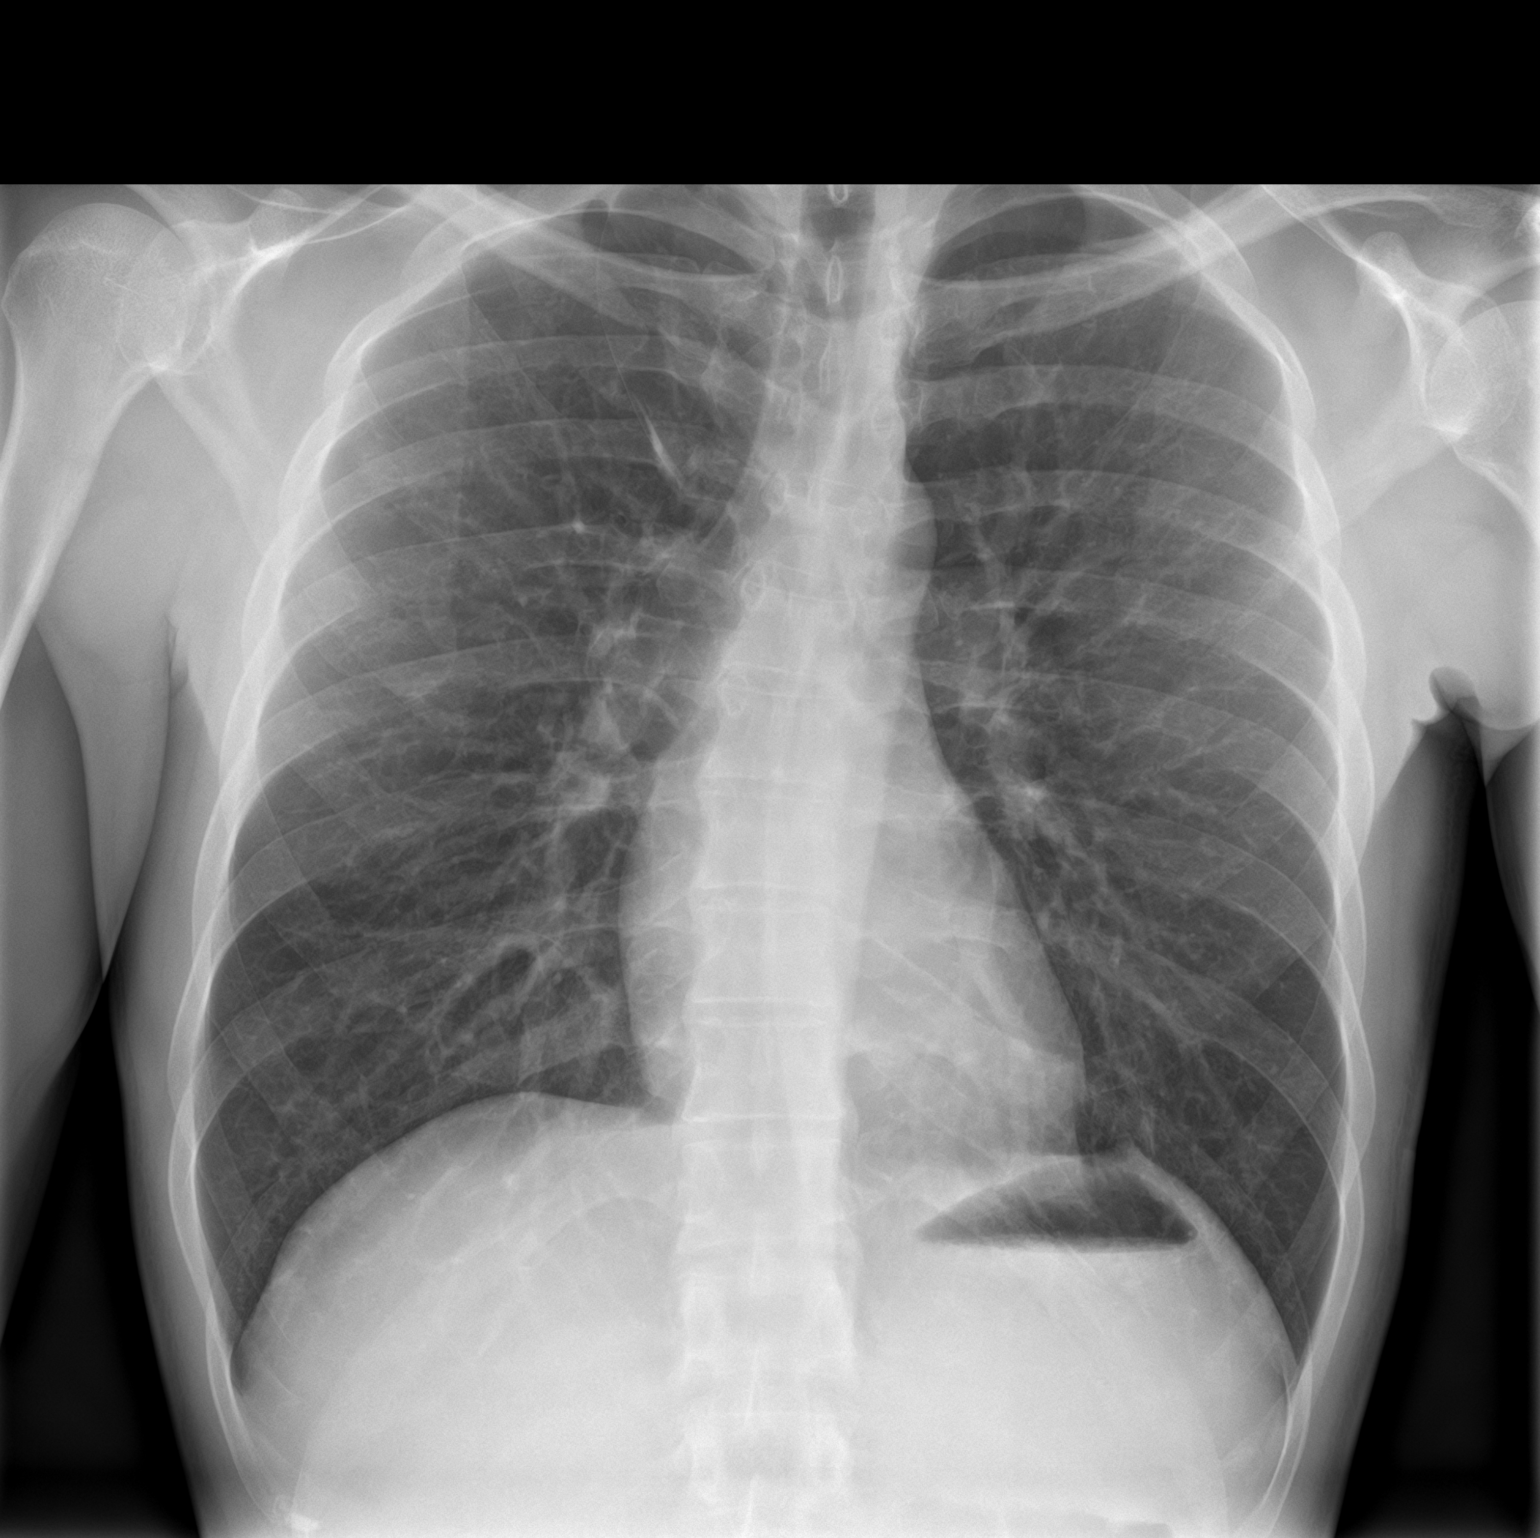

[chest lat]
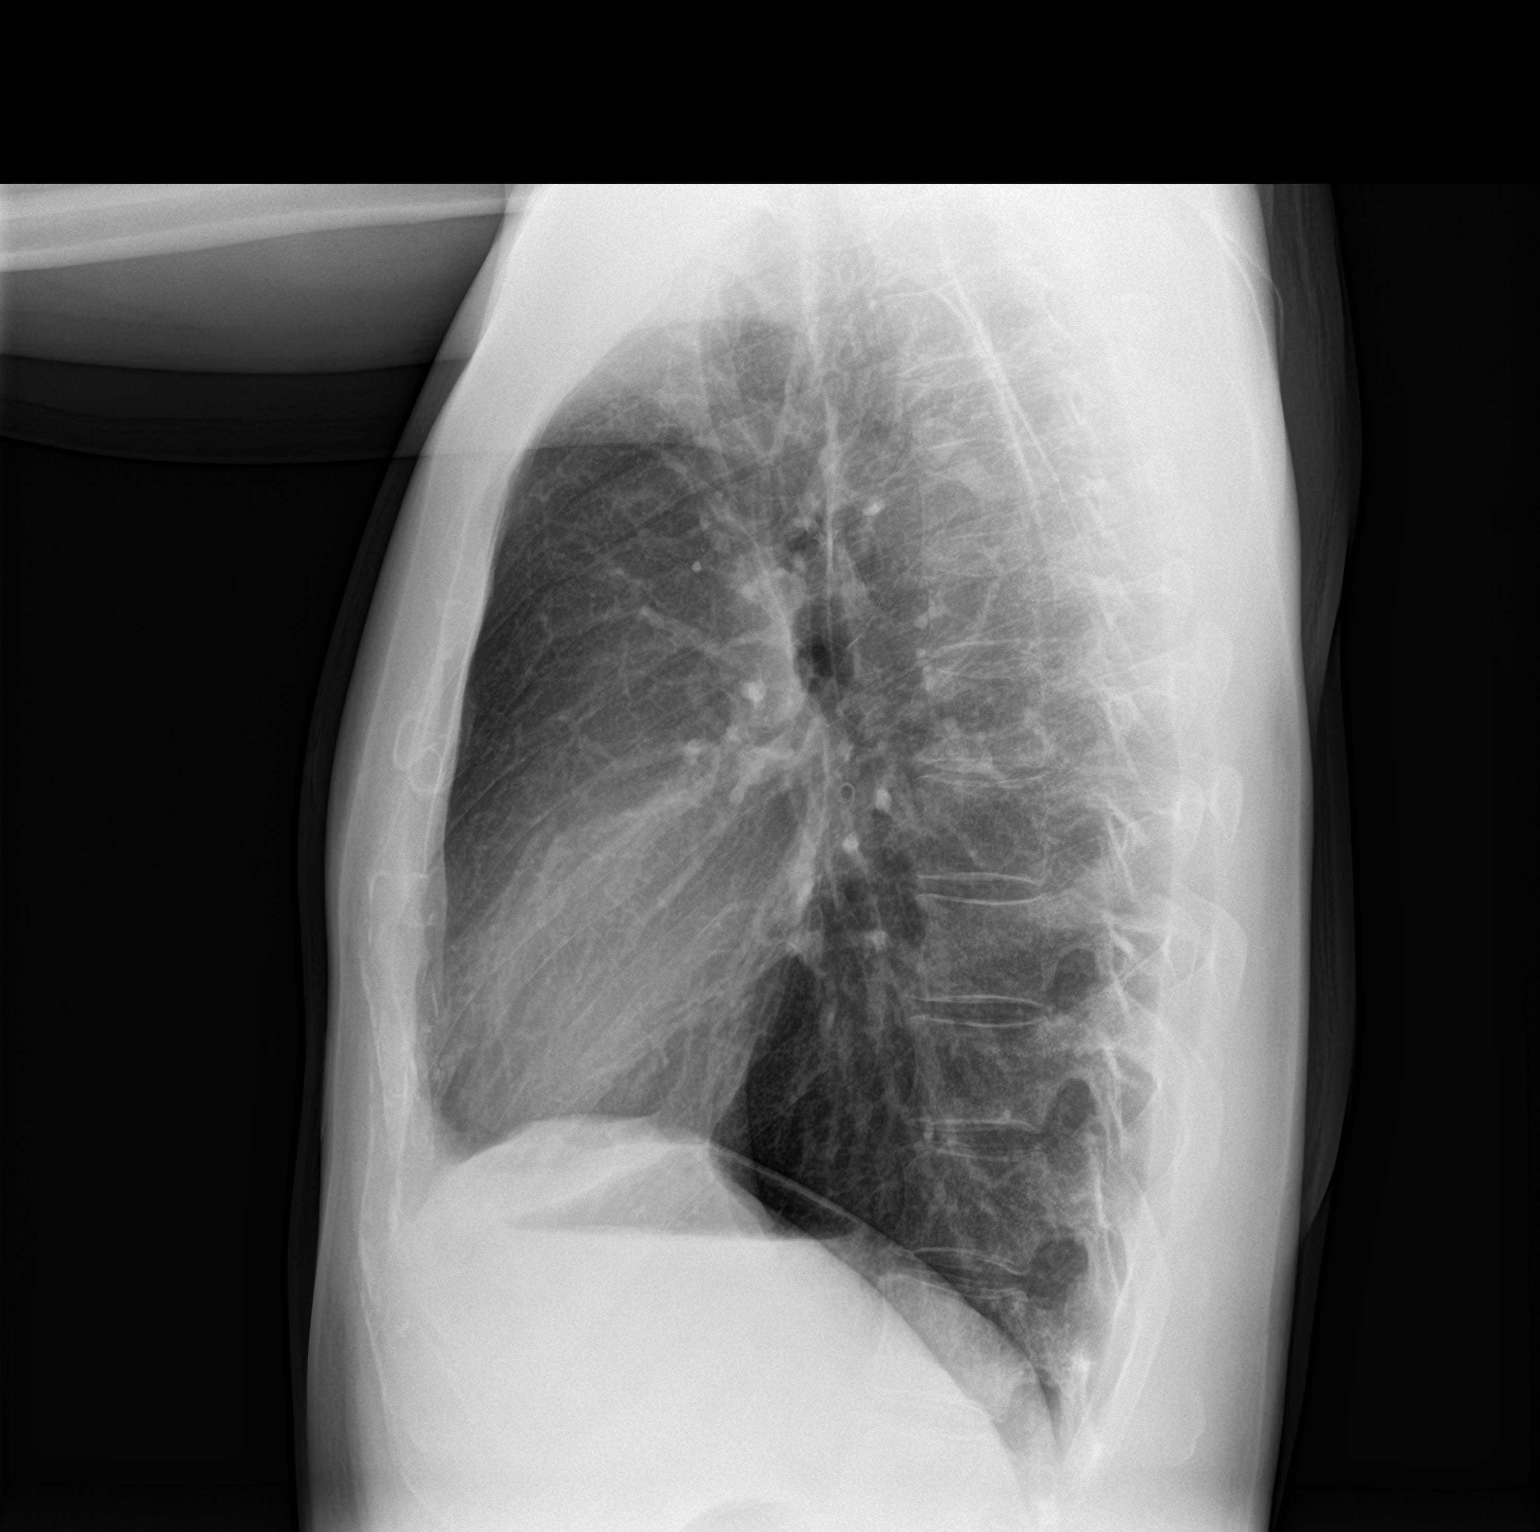

[2 of 2 positions shown; findings below may reference images not displayed]

FINDINGS: The heart size and mediastinal contours are within normal limits.
Both lungs are clear. The visualized skeletal structures are
unremarkable.
IMPRESSION: No active cardiopulmonary disease.

## 2022-12-24 DIAGNOSIS — Z765 Malingerer [conscious simulation]: Secondary | ICD-10-CM | POA: Insufficient documentation

## 2022-12-26 ENCOUNTER — Emergency Department
Admission: EM | Admit: 2022-12-26 | Discharge: 2022-12-27 | Disposition: A | Discharge status: Other Acute Inpatient Hospital | Payer: Self-pay | Attending: Emergency Medicine | Admitting: Emergency Medicine

## 2022-12-26 ENCOUNTER — Other Ambulatory Visit: Payer: Self-pay

## 2022-12-26 DIAGNOSIS — R45851 Suicidal ideations: Secondary | ICD-10-CM | POA: Insufficient documentation

## 2022-12-26 DIAGNOSIS — F419 Anxiety disorder, unspecified: Secondary | ICD-10-CM | POA: Insufficient documentation

## 2022-12-26 DIAGNOSIS — F2081 Schizophreniform disorder: Secondary | ICD-10-CM | POA: Insufficient documentation

## 2022-12-26 LAB — COMPREHENSIVE METABOLIC PANEL
ALT: 41 U/L (ref 0–44)
AST: 38 U/L (ref 15–41)
Albumin: 4.3 g/dL (ref 3.5–5.0)
Alkaline Phosphatase: 90 U/L (ref 38–126)
Anion gap: 10 (ref 5–15)
BUN: 13 mg/dL (ref 6–20)
CO2: 24 mmol/L (ref 22–32)
Calcium: 8.9 mg/dL (ref 8.9–10.3)
Chloride: 103 mmol/L (ref 98–111)
Creatinine, Ser: 0.75 mg/dL (ref 0.61–1.24)
GFR, Estimated: >60 mL/min (ref >60)
Glucose, Bld: 99 mg/dL (ref 70–99)
Potassium: 3.6 mmol/L (ref 3.5–5.1)
Sodium: 137 mmol/L (ref 135–145)
Total Bilirubin: 0.6 mg/dL (ref <1.2)
Total Protein: 7.8 g/dL (ref 6.5–8.1)

## 2022-12-26 LAB — CBC
HCT: 41.2 % (ref 39.0–52.0)
Hemoglobin: 13.5 g/dL (ref 13.0–17.0)
MCH: 30.7 pg (ref 26.0–34.0)
MCHC: 32.8 g/dL (ref 30.0–36.0)
MCV: 93.6 fL (ref 80.0–100.0)
Platelets: 309 10*3/uL (ref 150–400)
RBC: 4.4 MIL/uL (ref 4.22–5.81)
RDW: 14 % (ref 11.5–15.5)
WBC: 7.5 10*3/uL (ref 4.0–10.5)
nRBC: 0 % (ref 0.0–0.2)

## 2022-12-26 LAB — ACETAMINOPHEN LEVEL: Acetaminophen (Tylenol), Serum: <10 ug/mL — ABNORMAL LOW (ref 10–30)

## 2022-12-26 LAB — URINE DRUG SCREEN, QUALITATIVE (ARMC ONLY)
Amphetamines, Ur Screen: NOT DETECTED
Barbiturates, Ur Screen: NOT DETECTED
Benzodiazepine, Ur Scrn: NOT DETECTED
Cannabinoid 50 Ng, Ur ~~LOC~~: NOT DETECTED
Cocaine Metabolite,Ur ~~LOC~~: NOT DETECTED
MDMA (Ecstasy)Ur Screen: NOT DETECTED
Methadone Scn, Ur: NOT DETECTED
Opiate, Ur Screen: NOT DETECTED
Phencyclidine (PCP) Ur S: NOT DETECTED
Tricyclic, Ur Screen: NOT DETECTED

## 2022-12-26 LAB — ETHANOL: Alcohol, Ethyl (B): <10 mg/dL (ref <10)

## 2022-12-26 LAB — SALICYLATE LEVEL: Salicylate Lvl: <7 mg/dL — ABNORMAL LOW (ref 7.0–30.0)

## 2022-12-26 MED ORDER — RISPERIDONE 1 MG PO TABS
1.0000 mg | ORAL_TABLET | Freq: Two times a day (BID) | ORAL | Status: DC
Start: 2022-12-26 — End: 2022-12-27
  Administered 2022-12-26 – 2022-12-27 (×2): 1 mg via ORAL
  Filled 2022-12-26 (×2): qty 1

## 2022-12-26 MED ORDER — TRAZODONE HCL 100 MG PO TABS
100.0000 mg | ORAL_TABLET | Freq: Every evening | ORAL | Status: DC | PRN
Start: 2022-12-26 — End: 2022-12-27
  Administered 2022-12-26: 100 mg via ORAL
  Filled 2022-12-26: qty 1

## 2022-12-26 NOTE — ED Triage Notes (Signed)
Pt come to ER with [employer]. States he was with family and became anxious/agitated. Wanted to drink alcohol (which is what he usually does when upset) and hurt himself (denies SI) and throw stuff. Denies drinking any alcohol or having thoughts of hurting others. No specific plan to hurt/kill himself.

## 2022-12-26 NOTE — ED Provider Notes (Signed)
[facility] Provider Note    Event Date/Time   First MD Initiated Contact with Patient 12/26/22 1833     (approximate)   History   Anxiety   HPI  Ethan Sutton is a 37 y.o. male   Past medical history of frenula has been off of his medications because he is unable to obtain them, who presents to the emergency department with anxiety, suicidal ideation.  He was at his family's house, when he felt anxious wanted to drink alcohol, and was encouraged to come to the emergency department by family members who are concerned about his mental health.  He is here voluntarily.  Denies any drug or alcohol use today.  He did feel anxious.  He felt a desire to hurt himself though denies suicidal ideation, he did feel that he might hurt others.  He has no hallucinations.  He has no other acute medical complaints  External Medical Documents Reviewed: Hospital notes from May 2021 when he was here in the emergency department under IVC for mental health concerns      Physical Exam   Triage Vital Signs: ED Triage Vitals  Encounter Vitals Group     BP 12/26/22 1812 119/85     Systolic BP Percentile --      Diastolic BP Percentile --      Pulse Rate 12/26/22 1812 97     Resp 12/26/22 1812 18     Temp 12/26/22 1812 98.5 F (36.9 C)     Temp Source 12/26/22 1812 Oral     SpO2 12/26/22 1812 100 %     Weight --      Height --      Head Circumference --      Peak Flow --      Pain Score 12/26/22 1807 0     Pain Loc --      Pain Education --      Exclude from Growth Chart --     Most recent vital signs: Vitals:   12/26/22 1812  BP: 119/85  Pulse: 97  Resp: 18  Temp: 98.5 F (36.9 C)  SpO2: 100%    General: Awake, no distress.  CV:  Good peripheral perfusion.  Resp:  Normal effort.  Abd:  No distention.  Other:  Well-appearing no acute distress normal vital signs.  Conversant, pleasant.  Clear lungs.  No signs of obvious trauma right   ED Results /  Procedures / Treatments   Labs (all labs ordered are listed, but only abnormal results are displayed) Labs Reviewed  SALICYLATE LEVEL - Abnormal; Notable for the following components:      Result Value   Salicylate Lvl <7.0 (*)    All other components within normal limits  ACETAMINOPHEN LEVEL - Abnormal; Notable for the following components:   Acetaminophen (Tylenol), Serum <10 (*)    All other components within normal limits  COMPREHENSIVE METABOLIC PANEL  ETHANOL  CBC  URINE DRUG SCREEN, QUALITATIVE ([facility] ONLY)     I ordered and reviewed the above labs they are notable for cell counts electrolytes and toxicologic studies within normal limits.  PROCEDURES:  Critical Care performed: No  Procedures   MEDICATIONS ORDERED IN ED: Medications - No data to display  IMPRESSION / MDM / ASSESSMENT AND PLAN / ED COURSE  I reviewed the triage vital signs and the nursing notes.  Patient's presentation is most consistent with acute presentation with potential threat to life or bodily function.  Differential diagnosis includes, but is not limited to, acute decompensated psychiatric illness   The patient is on the cardiac monitor to evaluate for evidence of arrhythmia and/or significant heart rate changes.  MDM:    This is a patient with anxiety, questionable SI/HI, history of schizophrenia off of medications who presents with anxiety and mental health concerns.  Denies any drug or alcohol use, denies suicidality, denies any other acute medical complaints.  Basic labs and toxicologic labs within normal limits, medically clear for psychiatric evaluation.  Voluntary.         FINAL CLINICAL IMPRESSION(S) / ED DIAGNOSES   Final diagnoses:  Anxiety  Suicidal ideation     Rx / DC Orders   ED Discharge Orders     None        Note:  This document was prepared using Dragon voice recognition software and may include unintentional dictation  errors.    Pilar Jarvis, MD 12/26/22 Barry Brunner

## 2022-12-26 NOTE — ED Notes (Signed)
Pt belongings: Blue hoodie White t Designer, fashion/clothing 2 pairs of Grey socks Green belt White boxers [employer] cigarettes Lighter Paperwork Black phone 3 pennies 1 quarter 1 dime

## 2022-12-27 ENCOUNTER — Inpatient Hospital Stay (HOSPITAL_COMMUNITY)
Admission: AD | Admit: 2022-12-27 | Discharge: 2023-01-04 | DRG: 885 | Disposition: A | Discharge status: Home / Self Care | Payer: Self-pay | Source: Intra-hospital | Attending: Psychiatry | Admitting: Psychiatry

## 2022-12-27 ENCOUNTER — Encounter (HOSPITAL_COMMUNITY): Payer: Self-pay | Admitting: Psychiatry

## 2022-12-27 DIAGNOSIS — Z818 Family history of other mental and behavioral disorders: Secondary | ICD-10-CM

## 2022-12-27 DIAGNOSIS — F2081 Schizophreniform disorder: Secondary | ICD-10-CM | POA: Diagnosis not present

## 2022-12-27 DIAGNOSIS — R45851 Suicidal ideations: Secondary | ICD-10-CM | POA: Diagnosis present

## 2022-12-27 DIAGNOSIS — F32A Depression, unspecified: Secondary | ICD-10-CM | POA: Diagnosis present

## 2022-12-27 DIAGNOSIS — R5383 Other fatigue: Secondary | ICD-10-CM | POA: Diagnosis present

## 2022-12-27 DIAGNOSIS — F419 Anxiety disorder, unspecified: Secondary | ICD-10-CM | POA: Diagnosis present

## 2022-12-27 DIAGNOSIS — Z87891 Personal history of nicotine dependence: Secondary | ICD-10-CM

## 2022-12-27 MED ORDER — DIPHENHYDRAMINE HCL 50 MG/ML IJ SOLN: 50.0000 mg | Freq: Two times a day (BID) | INTRAMUSCULAR | Status: DC | PRN

## 2022-12-27 MED ORDER — ALUM & MAG HYDROXIDE-SIMETH 200-200-20 MG/5ML PO SUSP: 30.0000 mL | ORAL | Status: DC | PRN

## 2022-12-27 MED ORDER — MAGNESIUM HYDROXIDE 400 MG/5ML PO SUSP: 30.0000 mL | Freq: Every day | ORAL | Status: DC | PRN

## 2022-12-27 MED ORDER — RISPERIDONE 1 MG PO TABS
1.0000 mg | ORAL_TABLET | Freq: Two times a day (BID) | ORAL | Status: DC
Start: 2022-12-27 — End: 2022-12-27
  Filled 2022-12-27 (×2): qty 1

## 2022-12-27 MED ORDER — ENSURE ENLIVE PO LIQD
237.0000 mL | Freq: Two times a day (BID) | ORAL | Status: DC
Start: 2022-12-27 — End: 2023-01-04
  Administered 2022-12-27 – 2023-01-04 (×16): 237 mL via ORAL
  Filled 2022-12-27 (×18): qty 237

## 2022-12-27 MED ORDER — ACETAMINOPHEN 325 MG PO TABS: 650.0000 mg | ORAL_TABLET | Freq: Four times a day (QID) | ORAL | Status: DC | PRN

## 2022-12-27 MED ORDER — OLANZAPINE 10 MG IM SOLR: 10.0000 mg | Freq: Two times a day (BID) | INTRAMUSCULAR | Status: DC | PRN

## 2022-12-27 MED ORDER — OLANZAPINE 5 MG PO TABS
5.0000 mg | ORAL_TABLET | Freq: Every day | ORAL | Status: DC
  Administered 2022-12-27 – 2022-12-28 (×2): 5 mg via ORAL
  Filled 2022-12-27 (×4): qty 1

## 2022-12-27 MED ORDER — DIPHENHYDRAMINE HCL 25 MG PO CAPS: 50.0000 mg | ORAL_CAPSULE | Freq: Two times a day (BID) | ORAL | Status: DC | PRN

## 2022-12-27 MED ORDER — OLANZAPINE 10 MG PO TABS: 10.0000 mg | ORAL_TABLET | Freq: Two times a day (BID) | ORAL | Status: DC | PRN

## 2022-12-27 MED ORDER — TRAZODONE HCL 100 MG PO TABS
100.0000 mg | ORAL_TABLET | Freq: Every evening | ORAL | Status: DC | PRN
Start: 2022-12-27 — End: 2023-01-04
  Administered 2022-12-27 – 2023-01-03 (×8): 100 mg via ORAL
  Filled 2022-12-27 (×8): qty 1

## 2022-12-27 NOTE — ED Notes (Signed)
Patient accepted to [facility] room 505-1 Attending Dr. Abbott Pao

## 2022-12-27 NOTE — ED Notes (Signed)
Belongings bag 3/3 sent with patient at time of departure.

## 2022-12-27 NOTE — Consult Note (Cosign Needed Addendum)
[facility] Face-to-Face Psychiatry Consult   Reason for Consult:  HI and SI Referring Physician:  EDP Patient Identification: Ethan Sutton MRN:  [id number] Principal Diagnosis: Schizophreniform disorder (HCC) Diagnosis:  Principal Problem:   Schizophreniform disorder (HCC)   Total Time spent with patient: 45 minutes  Subjective:   Ethan Sutton is a 37 y.o. male patient admitted with HI and SI. "I had some anxiety and having some thoughts to hurt myself or others."  HPI:  37 yo male admitted to the ED after having anxiety with thoughts to hurt other people or himself.  On assessment he denied taking medications, "They gave them to me but I can't afford them."  He denied auditory and visual hallucinations yet appeared to have some thought blocking while responding to internal stimuli.  Denies substance abuse. His history changed from being homeless to living with his parents, seemed confused.  When asked his mother's name to obtain collateral, he stated, "Tonya, I think", he was able to provide her phone number for collateral information.  Ashok Croon, his mother, reported he "Is not mentally right."  He has been talking to himself, isolating in his room, urinating on the floor, cutting holes in the wall because he thought people were starring at him and then placing putty in the holes.  Both his brothers are well known at the hospital who also live at home with schizophrenia.    Past Psychiatric History: meth abuse, psychosis  Risk to Self:  yes Risk to Others:  yes Prior Inpatient Therapy:  yes Prior Outpatient Therapy:  denies  Past Medical History:  Past Medical History:  Diagnosis Date   Schizophrenia (HCC)    No past surgical history on file. Family History: No family history on file. Family Psychiatric  History: two brothers with schizophrenia Social History:  Social History   Substance and Sexual Activity  Alcohol Use Not Currently     Social History   Substance and Sexual  Activity  Drug Use Not Currently    Social History   Socioeconomic History   Marital status: Single    Spouse name: Not on file   Number of children: Not on file   Years of education: Not on file   Highest education level: Not on file  Occupational History   Not on file  Tobacco Use   Smoking status: Every Day    Types: Cigarettes   Smokeless tobacco: Never  Vaping Use   Vaping status: Never Used  Substance and Sexual Activity   Alcohol use: Not Currently   Drug use: Not Currently   Sexual activity: Not Currently    Birth control/protection: Condom, Abstinence  Other Topics Concern   Not on file  Social History Narrative   Not on file   Social Determinants of Health   Financial Resource Strain: Not on file  Food Insecurity: Not on file  Transportation Needs: Not on file  Physical Activity: Not on file  Stress: Not on file  Social Connections: Not on file   Additional Social History:    Allergies:  No Known Allergies  Labs:  Results for orders placed or performed during the hospital encounter of 12/26/22 (from the past 48 hour(s))  Comprehensive metabolic panel     Status: None   Collection Time: 12/26/22  6:13 PM  Result Value Ref Range   Sodium 137 135 - 145 mmol/L   Potassium 3.6 3.5 - 5.1 mmol/L   Chloride 103 98 - 111 mmol/L   CO2 24 22 -  32 mmol/L   Glucose, Bld 99 70 - 99 mg/dL    Comment: Glucose reference range applies only to samples taken after fasting for at least 8 hours.   BUN 13 6 - 20 mg/dL   Creatinine, Ser 8.41 0.61 - 1.24 mg/dL   Calcium 8.9 8.9 - 32.4 mg/dL   Total Protein 7.8 6.5 - 8.1 g/dL   Albumin 4.3 3.5 - 5.0 g/dL   AST 38 15 - 41 U/L   ALT 41 0 - 44 U/L   Alkaline Phosphatase 90 38 - 126 U/L   Total Bilirubin 0.6 <1.2 mg/dL   GFR, Estimated >40 >10 mL/min    Comment: (NOTE) Calculated using the CKD-EPI Creatinine Equation (2021)    Anion gap 10 5 - 15    Comment: Performed at [facility], [address]  [place], [place] [address]  Ethanol     Status: None   Collection Time: 12/26/22  6:13 PM  Result Value Ref Range   Alcohol, Ethyl (B) <10 <10 mg/dL    Comment: (NOTE) Lowest detectable limit for serum alcohol is 10 mg/dL.  For medical purposes only. Performed at [facility], [address] [place], [place] [address]   Salicylate level     Status: Abnormal   Collection Time: 12/26/22  6:13 PM  Result Value Ref Range   Salicylate Lvl <7.0 (L) 7.0 - 30.0 mg/dL    Comment: Performed at [facility], [address] [place], [place] [address]  Acetaminophen level     Status: Abnormal   Collection Time: 12/26/22  6:13 PM  Result Value Ref Range   Acetaminophen (Tylenol), Serum <10 (L) 10 - 30 ug/mL    Comment: (NOTE) Therapeutic concentrations vary significantly. A range of 10-30 ug/mL  may be an effective concentration for many patients. However, some  are best treated at concentrations outside of this range. Acetaminophen concentrations >150 ug/mL at 4 hours after ingestion  and >50 ug/mL at 12 hours after ingestion are often associated with  toxic reactions.  Performed at [facility], [address] [place], [place] [address]   cbc     Status: None   Collection Time: 12/26/22  6:13 PM  Result Value Ref Range   WBC 7.5 4.0 - 10.5 K/uL   RBC 4.40 4.22 - 5.81 MIL/uL   Hemoglobin 13.5 13.0 - 17.0 g/dL   HCT 87.5 64.3 - 32.9 %   MCV 93.6 80.0 - 100.0 fL   MCH 30.7 26.0 - 34.0 pg   MCHC 32.8 30.0 - 36.0 g/dL   RDW 51.8 84.1 - 66.0 %   Platelets 309 150 - 400 K/uL   nRBC 0.0 0.0 - 0.2 %    Comment: Performed at [facility], [address] [place], [place] [address]  Urine Drug Screen, Qualitative     Status: None   Collection Time: 12/26/22  6:14 PM  Result Value Ref Range   Tricyclic, Ur Screen NONE DETECTED NONE DETECTED   Amphetamines, Ur Screen NONE DETECTED NONE DETECTED   MDMA (Ecstasy)Ur Screen NONE DETECTED NONE DETECTED    Cocaine Metabolite,Ur [place] NONE DETECTED NONE DETECTED   Opiate, Ur Screen NONE DETECTED NONE DETECTED   Phencyclidine (PCP) Ur S NONE DETECTED NONE DETECTED   Cannabinoid 50 Ng, Ur [place] NONE DETECTED NONE DETECTED   Barbiturates, Ur Screen NONE DETECTED NONE DETECTED   Benzodiazepine, Ur Scrn NONE DETECTED NONE DETECTED   Methadone Scn, Ur NONE DETECTED NONE DETECTED  Comment: (NOTE) Tricyclics + metabolites, urine    Cutoff 1000 ng/mL Amphetamines + metabolites, urine  Cutoff 1000 ng/mL MDMA (Ecstasy), urine              Cutoff 500 ng/mL Cocaine Metabolite, urine          Cutoff 300 ng/mL Opiate + metabolites, urine        Cutoff 300 ng/mL Phencyclidine (PCP), urine         Cutoff 25 ng/mL Cannabinoid, urine                 Cutoff 50 ng/mL Barbiturates + metabolites, urine  Cutoff 200 ng/mL Benzodiazepine, urine              Cutoff 200 ng/mL Methadone, urine                   Cutoff 300 ng/mL  The urine drug screen provides only a preliminary, unconfirmed analytical test result and should not be used for non-medical purposes. Clinical consideration and professional judgment should be applied to any positive drug screen result due to possible interfering substances. A more specific alternate chemical method must be used in order to obtain a confirmed analytical result. Gas chromatography / mass spectrometry (GC/MS) is the preferred confirm atory method. Performed at [facility], [address] [place], [place] [address]     Current Facility-Administered Medications  Medication Dose Route Frequency Provider Last Rate Last Admin   risperiDONE (RISPERDAL) tablet 1 mg  1 mg Oral BID Pilar Jarvis, MD   1 mg at 12/27/22 0919   traZODone (DESYREL) tablet 100 mg  100 mg Oral QHS PRN Pilar Jarvis, MD   100 mg at 12/26/22 2144   Current Outpatient Medications  Medication Sig Dispense Refill   risperiDONE (RISPERDAL) 1 MG tablet Take 1 tablet (1 mg total) by mouth 2 (two) times  daily. 60 tablet 1   traZODone (DESYREL) 100 MG tablet Take 1 tablet (100 mg total) by mouth at bedtime as needed for sleep. 7 tablet 0    Musculoskeletal: Strength & Muscle Tone: within normal limits Gait & Station: normal Patient leans: N/A  Psychiatric Specialty Exam: Physical Exam Vitals and nursing note reviewed.  Constitutional:      Appearance: Normal appearance.  HENT:     Head: Normocephalic.     Nose: Nose normal.  Pulmonary:     Effort: Pulmonary effort is normal.  Musculoskeletal:        General: Normal range of motion.     Cervical back: Normal range of motion.  Neurological:     General: No focal deficit present.     Mental Status: He is alert.     Review of Systems  Psychiatric/Behavioral:  Positive for depression. The patient is nervous/anxious.   All other systems reviewed and are negative.   Blood pressure 125/82, pulse 88, temperature 98.3 F (36.8 C), temperature source Oral, resp. rate 18, SpO2 100%.There is no height or weight on file to calculate BMI.  General Appearance: Casual  Eye Contact:  Fair  Speech:  Slow  Volume:  Normal  Mood:  Anxious and Depressed  Affect:  Flat  Thought Process:  Coherent  Orientation:  Other:  person and place  Thought Content:  Hallucinations: Auditory, paranoia  Suicidal Thoughts:  Yes.  without intent/plan  Homicidal Thoughts:  Yes.  without intent/plan  Memory:  Immediate;   Fair Recent;   Poor Remote;   Poor  Judgement:  Poor  Insight:  Lacking  Psychomotor Activity:  Decreased  Concentration:  Concentration: Fair and Attention Span: Fair  Recall:  [employer] of Knowledge:  Fair  Language:  Fair  Akathisia:  No  Handed:  Right  AIMS (if indicated):     Assets:  Leisure Time Physical Health Resilience Social Support  ADL's:  Intact  Cognition:  Impaired,  Mild  Sleep:        Physical Exam: Physical Exam Vitals and nursing note reviewed.  Constitutional:      Appearance: Normal appearance.   HENT:     Head: Normocephalic.     Nose: Nose normal.  Pulmonary:     Effort: Pulmonary effort is normal.  Musculoskeletal:        General: Normal range of motion.     Cervical back: Normal range of motion.  Neurological:     General: No focal deficit present.     Mental Status: He is alert.    Review of Systems  Psychiatric/Behavioral:  Positive for depression. The patient is nervous/anxious.   All other systems reviewed and are negative.  Blood pressure 125/82, pulse 88, temperature 98.3 F (36.8 C), temperature source Oral, resp. rate 18, SpO2 100%. There is no height or weight on file to calculate BMI.  Treatment Plan Summary: Daily contact with patient to assess and evaluate symptoms and progress in treatment, Medication management, and Plan : Schizophreniform: Risperdal 1 mg BID  Insomnia: Trazodone 100 mg daily at bedtime PRN  Disposition: Recommend psychiatric Inpatient admission when medically cleared.  Nanine Means, NP 12/27/2022 9:58 AM

## 2022-12-27 NOTE — ED Notes (Signed)
VOL/pending psych consult 

## 2022-12-27 NOTE — [facility] Suicide Risk Assessment (Signed)
[facility] Admission Suicide Risk Assessment   Nursing information obtained from:    Demographic factors:  Male, Low socioeconomic status, Unemployed Current Mental Status:  Self-harm thoughts Loss Factors:  Decrease in vocational status, Legal issues Historical Factors:  Impulsivity Risk Reduction Factors:  Religious beliefs about death  Total Time spent with patient: 30 minutes Principal Problem: Schizophreniform disorder (HCC) Diagnosis:  Principal Problem:   Schizophreniform disorder (HCC)  Subjective Data:   37yo M with a history of psychosis, who was admitted to [facility] due to paranoia, not feeling safe from harming self or others.   Continued Clinical Symptoms: no SI, no HI.  COGNITIVE FEATURES THAT CONTRIBUTE TO RISK:  None    SUICIDE RISK:   Mild:  Suicidal ideation of limited frequency, intensity, duration, and specificity.  There are no identifiable plans, no associated intent, mild dysphoria and related symptoms, good self-control (both objective and subjective assessment), few other risk factors, and identifiable protective factors, including available and accessible social support.  PLAN OF CARE:  Psych admission. See H&P  I certify that inpatient services furnished can reasonably be expected to improve the patient's condition.   [name], MD 12/27/2022, 4:51 PM

## 2022-12-27 NOTE — Plan of Care (Signed)
  Problem: Education: Goal: Emotional status will improve Outcome: Not Progressing Goal: Mental status will improve Outcome: Not Progressing   

## 2022-12-27 NOTE — ED Notes (Signed)
Voluntary consent and rider waiver placed in patient chart

## 2022-12-27 NOTE — Hospital Course (Signed)
Ethan Sutton

## 2022-12-27 NOTE — Progress Notes (Signed)
Patient ID: Ethan Sutton, male   DOB: 12-Mar-1985, 37 y.o.   MRN: [id number] Pt admitted to [facility] from [facility] ED voluntarily. The patient has a hx of schizophrenia, and upon admission is noted to be a poor historian, pausing and with some thought blocking noted at times. The patient reports he came to ED to '' get help because I was having bad thoughts '' when asking pt about these thoughts he declines to elaborate, and answers mostly in circular answers just stating '' just bad thoughts and then I wanted to drink and do bad things. '' Pt shares he was recently released from prison and was '' locked up for a year. ''  He currently denies any AH VH or SI HI . He is cooperative with admission process. Skin assessment unremarkable. Pt given meal tray and juice and oriented to the unit. Report given to BICU RN to assume care at this time. Pt is safe.

## 2022-12-27 NOTE — ED Notes (Signed)
Called for safetransport to [facility] [id number]  [phone]

## 2022-12-27 NOTE — H&P (Signed)
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 Ethan Sutton is a 37yo M with a history of psychosis, who was admitted to [facility] due to paranoia, not feeling safe from harming self or others.  History of Present Illness:  Patient was brought to the ED with anxiety, suicidal ideation. Per ED physician note "He was at his family's house, when he felt anxious wanted to drink alcohol, and was encouraged to come to the emergency department by family members who are concerned about his mental health.  He is here voluntarily. Denies any drug or alcohol use today.  He did feel anxious.  He felt a desire to hurt himself though denies suicidal ideation, he did feel that he might hurt others.  He has no hallucinations.  He has no other acute medical complaints".  Patient seen in [facility] unit. Patient is calm and cooperative. He reports he was admitted due to "not feeling right". He reports having frequent obsessive thoughts of wanting to hurt self or others and feeling constantly not safe, like something bad will happen to him. He reports he self-medicates with alcohol as "it helps to forget thoughts for a while, but then they come back". He reports feeling tired of having those intrusive thoughts and feeling paranoid, not sleeping well. 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 He reports that "thoughts" got much worse at parents home. He does not think that alcohol causes those thoughts, he again emphasizes that he drinks to self-medicate. He denies any recent drinking though. Reports that he had "only one drink since prison". He  denies any other substance use. 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 He denies hearing any "voices", seeing things other can not or having any other unusual sensations. He admits his two brothers have Schizophrenia, but he thinks his symptoms are different. Reports feeling depressed and anxious due to "thoughts". Denies having current suicidal or homicidal thoughts, plans.  Per chart, consulting NP obtained collateral from patient`s mother, who reported that the patient "has been talking to himself, isolating in his room, urinating on the floor, cutting holes in the wall because he thought people were starring at him and then placing putty in the holes." When I asked the patient about that type of behaviors, he said "I don`t know. I don`t feel safe there".  Per chart review, patient had several ER visits to [facility] for "psychosis", one admission to [facility] for psychosis substance-induced vs. Schizophrenia. Was started on Risperidone at that time.  Patient admits he had past admission. He remembers taking Risperidone, states "it did not work" and he would not like to be restarted on that medication again.   Patient denies any current physical complaints. Denies any chronic medical conditions. Denies any allergies.  ED workup: CBC, CMP - wnl. UDS - negative.  EKG: sinus brady, Qtc .   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 Previous psychiatric hospitalizations: [facility] 2021 Previous outpatient psychiatrist: unknown Therapist/Counselor: none History of prior suicide attempts: denies Non-suicidal self-injurious behaviors: denies History of violence: denies Previous psych medication: Risperidone Current psych  medications: none    Social history: -Patient has no guardian. -Adverse childhood experience: denies h/o physical, emotional, sexual abuse. -Currently lives:  with family  -Marital/relationships history: single -no children -Work/Finances: unemployed -Armed forces operational officer History: prison -Guns in possession: denies  SUBSTANCE USE: Alcohol: reports occasional use Nicotine:  denies Illicit drug use: denies recent use. H/o amphetamine abuse.    Family history: Schizophrenia in two brothers. Patient denies suicide in family members.   Is the patient at risk to self? No.  Has the patient been a risk to self in the past 6 months? No.  Has the patient been a risk to self within the distant past? No.  Is the patient a risk to others? No.  Has the patient been a risk to others in the past 6 months? No.  Has the patient been a risk to others within the distant past? No.   [place] Scale:  Flowsheet Row Ad[facility] (Current) [facility] from [facility]2022 [facility] at [facility] ED from 05/29/2019 in [facility] Emergency Department at [facility]  C-SSRS RISK CATEGORY No Risk No Risk No Risk        Prior Inpatient Therapy: Yes.   If yes, describe - 2021 [facility]  Prior Outpatient Therapy: No. If yes, describe - noncompliant  Alcohol Screening: 1. How often do you have a drink containing alcohol?: Never 2. How many drinks containing alcohol do you have on a typical day when you are drinking?: 1 or 2 3. How often do you have six or more drinks on one occasion?: Never AUDIT-C Score: 0 4. How often during the last year have you found that you were not able to stop drinking once you had started?: Never 5. How often during the last year have you failed to do what was normally expected from you because of drinking?: Never 6. How often during the last year have you needed a first drink in the morning to get yourself going after a heavy drinking session?: Never 7. How often during the last year have you had a feeling of guilt of remorse after drinking?: Never 8. How often during  the last year have you been unable to remember what happened the night before because you had been drinking?: Never 9. Have you or someone else been injured as a result of your drinking?: No 10. Has a relative or friend or a doctor or another health worker been concerned about your drinking or suggested you cut down?: No Alcohol Use Disorder Identification Test Final Score (AUDIT): 0 Alcohol Brief Interventions/Follow-up: Alcohol education/Brief advice Substance Abuse History in the last 12 months:  Yes.   Consequences of Substance Abuse: NA Previous Psychotropic Medications: Yes  Psychological Evaluations: Yes  Past Medical History:  Past Medical History:  Diagnosis Date   Schizophrenia (HCC)     Past Surgical History:  Procedure Laterality Date   NO PAST SURGERIES     Family History: History reviewed. No pertinent family history. Family Psychiatric  History: see above Tobacco Screening:  Social History   Tobacco Use  Smoking Status Former   Current packs/day: 0.00   Types: Cigarettes   Quit date: 2019   Years since quitting: 5.9  Smokeless Tobacco Never    BH Tobacco Counseling     Are you interested in Tobacco Cessation Medications?  N/A, patient does not use tobacco products Counseled patient on smoking cessation:  N/A, patient does not use tobacco products Reason  Tobacco Screening Not Completed: No value filed.       Social History:  Social History   Substance and Sexual Activity  Alcohol Use Not Currently   Comment: ive been in prison for a year i haven't used any     Social History   Substance and Sexual Activity  Drug Use Not Currently    Additional Social History:                           Allergies:  No Known Allergies Lab Results:  Results for orders placed or performed during the hospital encounter of 12/26/22 (from the past 48 hour(s))  Comprehensive metabolic panel     Status: None   Collection Time: 12/26/22  6:13 PM  Result Value  Ref Range   Sodium 137 135 - 145 mmol/L   Potassium 3.6 3.5 - 5.1 mmol/L   Chloride 103 98 - 111 mmol/L   CO2 24 22 - 32 mmol/L   Glucose, Bld 99 70 - 99 mg/dL    Comment: Glucose reference range applies only to samples taken after fasting for at least 8 hours.   BUN 13 6 - 20 mg/dL   Creatinine, Ser 1.61 0.61 - 1.24 mg/dL   Calcium 8.9 8.9 - 09.6 mg/dL   Total Protein 7.8 6.5 - 8.1 g/dL   Albumin 4.3 3.5 - 5.0 g/dL   AST 38 15 - 41 U/L   ALT 41 0 - 44 U/L   Alkaline Phosphatase 90 38 - 126 U/L   Total Bilirubin 0.6 <1.2 mg/dL   GFR, Estimated >04 >54 mL/min    Comment: (NOTE) Calculated using the CKD-EPI Creatinine Equation (2021)    Anion gap 10 5 - 15    Comment: Performed at [facility], [address], [place], [place] [address]  Ethanol     Status: None   Collection Time: 12/26/22  6:13 PM  Result Value Ref Range   Alcohol, Ethyl (B) <10 <10 mg/dL    Comment: (NOTE) Lowest detectable limit for serum alcohol is 10 mg/dL.  For medical purposes only. Performed at [facility], [address] [place], [place] [address]   Salicylate level     Status: Abnormal   Collection Time: 12/26/22  6:13 PM  Result Value Ref Range   Salicylate Lvl <7.0 (L) 7.0 - 30.0 mg/dL    Comment: Performed at [facility], [address] [place], [place] [address]  Acetaminophen level     Status: Abnormal   Collection Time: 12/26/22  6:13 PM  Result Value Ref Range   Acetaminophen (Tylenol), Serum <10 (L) 10 - 30 ug/mL    Comment: (NOTE) Therapeutic concentrations vary significantly. A range of 10-30 ug/mL  may be an effective concentration for many patients. However, some  are best treated at concentrations outside of this range. Acetaminophen concentrations >150 ug/mL at 4 hours after ingestion  and >50 ug/mL at 12 hours after ingestion are often associated with  toxic reactions.  Performed at [facility], [address] [place], [place]  [address]   cbc     Status: None   Collection Time: 12/26/22  6:13 PM  Result Value Ref Range   WBC 7.5 4.0 - 10.5 K/uL   RBC 4.40 4.22 - 5.81 MIL/uL   Hemoglobin 13.5 13.0 - 17.0 g/dL   HCT 57.8 46.9 - 62.9 %   MCV 93.6 80.0 - 100.0 fL   MCH  30.7 26.0 - 34.0 pg   MCHC 32.8 30.0 - 36.0 g/dL   RDW 16.1 09.6 - 04.5 %   Platelets 309 150 - 400 K/uL   nRBC 0.0 0.0 - 0.2 %    Comment: Performed at [facility], [address] [place], [place] [address]  Urine Drug Screen, Qualitative     Status: None   Collection Time: 12/26/22  6:14 PM  Result Value Ref Range   Tricyclic, Ur Screen NONE DETECTED NONE DETECTED   Amphetamines, Ur Screen NONE DETECTED NONE DETECTED   MDMA (Ecstasy)Ur Screen NONE DETECTED NONE DETECTED   Cocaine Metabolite,Ur [place] NONE DETECTED NONE DETECTED   Opiate, Ur Screen NONE DETECTED NONE DETECTED   Phencyclidine (PCP) Ur S NONE DETECTED NONE DETECTED   Cannabinoid 50 Ng, Ur [place] NONE DETECTED NONE DETECTED   Barbiturates, Ur Screen NONE DETECTED NONE DETECTED   Benzodiazepine, Ur Scrn NONE DETECTED NONE DETECTED   Methadone Scn, Ur NONE DETECTED NONE DETECTED    Comment: (NOTE) Tricyclics + metabolites, urine    Cutoff 1000 ng/mL Amphetamines + metabolites, urine  Cutoff 1000 ng/mL MDMA (Ecstasy), urine              Cutoff 500 ng/mL Cocaine Metabolite, urine          Cutoff 300 ng/mL Opiate + metabolites, urine        Cutoff 300 ng/mL Phencyclidine (PCP), urine         Cutoff 25 ng/mL Cannabinoid, urine                 Cutoff 50 ng/mL Barbiturates + metabolites, urine  Cutoff 200 ng/mL Benzodiazepine, urine              Cutoff 200 ng/mL Methadone, urine                   Cutoff 300 ng/mL  The urine drug screen provides only a preliminary, unconfirmed analytical test result and should not be used for non-medical purposes. Clinical consideration and professional judgment should be applied to any positive drug screen result due to possible interfering  substances. A more specific alternate chemical method must be used in order to obtain a confirmed analytical result. Gas chromatography / mass spectrometry (GC/MS) is the preferred confirm atory method. Performed at [facility], [address] [place], [place] [address]     Blood Alcohol level:  Lab Results  Component Value Date   [facility] <10 12/26/2022   ETH <10 05/25/2019    Metabolic Disorder Labs:  No results found for: "HGBA1C", "MPG" No results found for: "PROLACTIN" No results found for: "CHOL", "TRIG", "HDL", "CHOLHDL", "VLDL", "LDLCALC"  Current Medications: Current Facility-Administered Medications  Medication Dose Route Frequency Provider Last Rate Last Admin   acetaminophen (TYLENOL) tablet 650 mg  650 mg Oral Q6H PRN Charm Rings, NP       acetaminophen (TYLENOL) tablet 650 mg  650 mg Oral Q6H PRN Charm Rings, NP       alum & mag hydroxide-simeth (MAALOX/MYLANTA) 200-200-20 MG/5ML suspension 30 mL  30 mL Oral Q4H PRN Charm Rings, NP       alum & mag hydroxide-simeth (MAALOX/MYLANTA) 200-200-20 MG/5ML suspension 30 mL  30 mL Oral Q4H PRN Charm Rings, NP       diphenhydrAMINE (BENADRYL) capsule 50 mg  50 mg Oral BID PRN Charm Rings, NP       Or   diphenhydrAMINE (BENADRYL) injection 50 mg  50 mg Intramuscular BID PRN Charm Rings, NP       feeding supplement (ENSURE ENLIVE / ENSURE PLUS) liquid 237 mL  237 mL Oral BID BM Attiah, Nadir, MD   237 mL at 12/27/22 1558   magnesium hydroxide (MILK OF MAGNESIA) suspension 30 mL  30 mL Oral Daily PRN Charm Rings, NP       magnesium hydroxide (MILK OF MAGNESIA) suspension 30 mL  30 mL Oral Daily PRN Charm Rings, NP       OLANZapine (ZYPREXA) tablet 10 mg  10 mg Oral BID PRN Charm Rings, NP       Or   OLANZapine (ZYPREXA) injection 10 mg  10 mg Intramuscular BID PRN Charm Rings, NP       OLANZapine (ZYPREXA) tablet 5 mg  5 mg Oral QHS [name], MD       traZODone (DESYREL)  tablet 100 mg  100 mg Oral QHS PRN Charm Rings, NP       PTA Medications: No medications prior to admission.    Musculoskeletal: Strength & Muscle Tone: within normal limits Gait & Station: normal Patient leans: N/A   Psychiatric Specialty Exam:  Appearance:  AAM, appearing stated age,  wearing appropriate to the situation casual/hospital clothes, with fair grooming and hygiene. Normal level of alertness and appropriate facial expression.  Attitude/Behavior: calm, cooperative, engaging with appropriate eye contact.  Motor: WNL; dyskinesias not evident. Gait appears in full range.  Speech: spontaneous, clear, coherent, normal comprehension.  Mood: "I am not right"  Affect: restricted, blunted.  Thought process: patient appears coherent, goal-directed, linear with questions; some thought blocking observed.  Thought content: patient denies suicidal thoughts, denies homicidal thoughts; expresses paranoid delusions.  Thought perception: patient reports racing thoughts; patient denies auditory and visual hallucinations. Did not appear internally stimulated.  Cognition: patient is alert and oriented in self, place, date.  Insight: limited, in regards of understanding of presence, nature, cause, and significance of mental or emotional problem.  Judgement: questionable, in regards of ability to make good decisions concerning the appropriate thing to do in various situations, including ability to form opinions regarding their mental health condition.   Physical Exam: Physical Exam Constitutional:      Appearance: Normal appearance.  HENT:     Head: Normocephalic and atraumatic.  Eyes:     Extraocular Movements: Extraocular movements intact.     Pupils: Pupils are equal, round, and reactive to light.  Cardiovascular:     Rate and Rhythm: Normal rate and regular rhythm.  Pulmonary:     Effort: Pulmonary effort is normal.     Breath sounds: Normal breath sounds.   Abdominal:     General: Abdomen is flat.     Palpations: Abdomen is soft.  Musculoskeletal:        General: Normal range of motion.     Cervical back: Normal range of motion.  Skin:    General: Skin is warm and dry.  Neurological:     General: No focal deficit present.     Mental Status: He is alert and oriented to person, place, and time.  Psychiatric:        Mood and Affect: Mood normal.        Behavior: Behavior normal.    Review of Systems  Constitutional:  Negative for chills and fever.  HENT:  Negative for hearing loss.   Eyes:  Negative for blurred vision.  Respiratory:  Negative for cough.  Cardiovascular:  Negative for chest pain.  Gastrointestinal:  Negative for abdominal pain and vomiting.  Musculoskeletal:  Negative for back pain.  Skin:  Negative for rash.  Neurological:  Negative for dizziness, focal weakness and headaches.  Psychiatric/Behavioral:  Positive for depression and substance abuse. Negative for suicidal ideas. The patient is nervous/anxious.    Blood pressure 115/75, pulse 82, temperature 98 F (36.7 C), temperature source Oral, resp. rate 18, height 5\' 6"  (1.676 m), weight 68 kg, SpO2 100%. Body mass index is 24.21 kg/m.  Treatment Plan Summary: Daily contact with patient to assess and evaluate symptoms and progress in treatment and Medication management  36yo M with a history of psychosis, who was admitted to [facility] due to paranoia, not feeling safe from harming self or others. Patient reports paranoid delusions and intrusive thoughts that he can hurt self of others without active suicidal or homicidal ideations. Patient denies recent drug use. 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  Impression: Schizophrenia  Plan: -inpatient psychiatric admission will be continued. -patient will be integrated in the milieu.   -patient will be encouraged to attend groups.   -15-minute checks;  -daily contact with patient to assess and  evaluate symptoms and progress in treatment;  -psychoeducation -vital signs: q12 hours.  -precautions: suicide, elopement, and assault.  -placed on room lock out for meals, snacks and groups.  -Medications:  - will stop the ordered in ER Risperidone due to patient is not willing to restart that.  -start Zyprexa 5mg  PO at bedtime for psychosis, sleep.    The risks/benefits/side-effects/alternatives to this medication were discussed in detail with the patient and time was given for questions. The patient consents to medication trial.  - Metabolic profile and EKG monitoring obtained while on an atypical antipsychotic.  - Encouraged patient to participate in unit milieu and in scheduled group therapies   Medical Problems: not reported.  PRN medications: acetaminophen, acetaminophen, alum & mag hydroxide-simeth, alum & mag hydroxide-simeth, diphenhydrAMINE **OR** diphenhydrAMINE, magnesium hydroxide, magnesium hydroxide, OLANZapine **OR** OLANZapine, traZODone   -We will attempt to collect collateral information.  -Disposition will be determined after the patient is stabilized. -Social work and case management to assist with discharge planning and identification of hospital follow-up needs prior to discharge -Estimated LOS: 7 days -Discharge Concerns: Need to establish a safety plan; Medication compliance and effectiveness -Discharge Goals: Return home with outpatient referrals for mental health follow-up including medication management/psychotherapy     Observation Level/Precautions:  15 minute checks  Laboratory:      Psychotherapy:    Medications:    Consultations:    Discharge Concerns:    Estimated LOS:  Other:     Physician Treatment Plan for Primary Diagnosis: Schizophreniform disorder (HCC) Long Term Goal(s): Improvement in symptoms so as ready for discharge  Short Term Goals: Ability to identify changes in lifestyle to reduce recurrence of condition will improve,  Ability to verbalize feelings will improve, Ability to disclose and discuss suicidal ideas, Ability to demonstrate self-control will improve, Ability to identify and develop effective coping behaviors will improve, Ability to maintain clinical measurements within normal limits will improve, Compliance with prescribed medications will improve, and Ability to identify triggers associated with substance abuse/mental health issues will improve  Physician Treatment Plan for Secondary Diagnosis: Principal Problem:   Schizophreniform disorder (HCC)  Long Term Goal(s): Improvement in symptoms so as ready for discharge  Short Term Goals: Ability to identify changes in lifestyle to reduce recurrence of condition will  improve, Ability to verbalize feelings will improve, Ability to disclose and discuss suicidal ideas, Ability to demonstrate self-control will improve, Ability to identify and develop effective coping behaviors will improve, Ability to maintain clinical measurements within normal limits will improve, Compliance with prescribed medications will improve, and Ability to identify triggers associated with substance abuse/mental health issues will improve  I certify that inpatient services furnished can reasonably be expected to improve the patient's condition.    [name], MD 11/29/20244:03 PM

## 2022-12-27 NOTE — Progress Notes (Signed)
   12/27/22 2016  Psych Admission Type (Psych Patients Only)  Admission Status Voluntary  Psychosocial Assessment  Patient Complaints Anxiety;Nervousness;Suspiciousness  Eye Contact Brief  Facial Expression Flat  Affect Anxious  Speech Slow  Interaction Guarded  Motor Activity Slow  Appearance/Hygiene In scrubs  Behavior Characteristics Anxious  Mood Preoccupied;Anxious;Suspicious  Thought Process  Coherency WDL  Content Paranoia  Delusions Paranoid  Perception Hallucinations  Hallucination Auditory (Patient appeared to be responding during medication administration.)  Judgment Impaired  Confusion WDL  Danger to Self  Current suicidal ideation? Denies  Danger to Others  Danger to Others None reported or observed   On assessment, patient was observed at the bedside, at the medication window, and in the dayroom.  Patient presented with moderate anxiety and nervousness.  Patient appeared to be paranoid during medication pass, checking behind himself often.  Patient reported he needed to have food to follow his medication to ensure the medication went down successfully.  Patient stated, "some people don't know you have to have food to make your medicine go all the way down."  Patient denied SI/HI/AVH, but was observed responding while waiting for his medications.  Administered PRN Trazodone per [facility] per patient request to assist with sleep.  Patient is safe on the unit with q15 minute safety checks.

## 2022-12-27 NOTE — BH Assessment (Signed)
Comprehensive Clinical Assessment (CCA) Note  12/27/2022 Ethan Sutton [id number]  Chief Complaint:  Chief Complaint  Patient presents with   Anxiety   Visit Diagnosis: Schizophreniform Disorder    Ethan Sutton is a 37 year old male who presents to the ER due to his family having concerns about the current state of his mental health. Per the patient's mother, he has had ongoing issues with responding to internal stimuli, and paranoia. While at home, the patient will laugh and talk with people that's not there. He cut holes in the walls, because he believes people are spying on him. Instead of leaving the house by walking through the door, he goes through the windows. Other times he put putty on the ceiling but it's unclear why he does that. Mother further states, he supposes to be taking medications but he doesn't. He has two other brothers who are diagnosed with schizophrenia and he is one who don't take his medications.  During the interview, the patient was calm, cooperative and pleasant. He was able to provide appropriate answers to the questions. Based on his response time and mumbling under his breath, he was believed to be responding to internal stimuli, but denied it. He was experiencing thought blocking during the interview.  CCA Screening, Triage and Referral (STR)  Patient Reported Information How did you hear about [name]? Family/Friend  What Is the Reason for Your Visit/Call Today? Patient brought to the ER due to his current mental state. He has gone an extended period of time without taking his mental health medications.  How Long Has This Been Causing You Problems? > than 6 months  What Do You Feel Would Help You the Most Today? Treatment for Depression or other mood problem   Have You Recently Had Any Thoughts About Hurting Yourself? No  Are You Planning to Commit Suicide/Harm Yourself At This time? No   Flowsheet Row ED from 12/26/2022 in [facility] Emergency  Department at [facility] ED from 05/29/2019 in [facility] Emergency Department at [facility] Admission (Discharged) from 05/25/2019 in [facility] INPATIENT BEHAVIORAL MEDICINE  C-SSRS RISK CATEGORY No Risk No Risk No Risk       Have you Recently Had Thoughts About Hurting Someone Ethan Sutton? No  Are You Planning to Harm Someone at This Time? No  Explanation: No data recorded  Have You Used Any Alcohol or Drugs in the Past 24 Hours? No data recorded What Did You Use and How Much? No data recorded  Do You Currently Have a Therapist/Psychiatrist? No data recorded Name of Therapist/Psychiatrist:    Have You Been Recently Discharged From Any Office Practice or Programs? No  Explanation of Discharge From Practice/Program: No data recorded   CCA Screening Triage Referral Assessment Type of Contact: Face-to-Face  Telemedicine Service Delivery:   Is this Initial or Reassessment?   Date Telepsych consult ordered in [facility]:    Time Telepsych consult ordered in [facility]:    Location of Assessment: [facility] ED  Provider Location: [facility] ED  Collateral Involvement: No data recorded  Does Patient Have a Court Appointed Legal Guardian? No data recorded Legal Guardian Contact Information: No data recorded Copy of Legal Guardianship Form: No data recorded Legal Guardian Notified of Arrival: No data recorded Legal Guardian Notified of Pending Discharge: No data recorded If Minor and Not Living with Parent(s), Who has Custody? No data recorded Is CPS involved or ever been involved? Never  Is APS involved or ever been involved? Never  Patient Determined To Be At Risk  for Harm To Self or Others Based on Review of Patient Reported Information or Presenting Complaint? No  Method: No data recorded Availability of Means: No data recorded Intent: No data recorded Notification Required: No data recorded Additional Information for Danger to Others Potential: No data recorded Additional Comments for Danger  to Others Potential: No data recorded Are There Guns or Other Weapons in Your Home? No  Types of Guns/Weapons: No data recorded Are These Weapons Safely Secured?                            No data recorded Who Could Verify You Are Able To Have These Secured: No data recorded Do You Have any Outstanding Charges, Pending Court Dates, Parole/Probation? No data recorded Contacted To Inform of Risk of Harm To Self or Others: No data recorded  Does Patient Present under Involuntary Commitment? No  [place] of Residence: Ethan Sutton  Patient Currently Receiving the Following Services: Not Receiving Services  Determination of Need: Emergent (2 hours)  Options For Referral: ED Visit; Inpatient Hospitalization  CCA Biopsychosocial Patient Reported Schizophrenia/Schizoaffective Diagnosis in Past: Yes  Strengths: Have a support system, some insight and pleasant.  Mental Health Symptoms Depression:   Difficulty Concentrating   Duration of Depressive symptoms:  Duration of Depressive Symptoms: Greater than two weeks   Mania:   Change in energy/activity; Recklessness   Anxiety:    N/A   Psychosis:   Hallucinations; Other negative symptoms   Duration of Psychotic symptoms:    Trauma:   N/A   Obsessions:   N/A   Compulsions:   N/A   Inattention:   N/A   Hyperactivity/Impulsivity:   N/A   Oppositional/Defiant Behaviors:   N/A   Emotional Irregularity:   N/A   Other Mood/Personality Symptoms:  No data recorded   Mental Status Exam Appearance and self-care  Stature:   Average   Weight:   Average weight   Clothing:   Neat/clean; Age-appropriate   Grooming:   Normal   Cosmetic use:   None   Posture/gait:   Normal   Motor activity:   -- (Within normal range.)   Sensorium  Attention:   Normal   Concentration:   Preoccupied   Orientation:   Person; Place   Recall/memory:   Normal   Affect and Mood  Affect:   Appropriate   Mood:   Anxious    Relating  Eye contact:   Fleeting   Facial expression:   Responsive   Attitude toward examiner:   Cooperative   Thought and Language  Speech flow:  Blocked; Paucity   Thought content:   Appropriate to Mood and Circumstances   Preoccupation:   None   Hallucinations:   Auditory   Organization:   Loose   Company secretary of Knowledge:   Fair   Intelligence:   Average   Abstraction:   Armed forces technical officer:   Impaired   Reality Testing:   Distorted   Insight:   Flashes of insight   Decision Making:   Confused   Social Functioning  Social Maturity:   Isolates; Impulsive   Social Judgement:   Heedless; Naive   Stress  Stressors:   Other (Comment)   Coping Ability:   Exhausted; Overwhelmed   Skill Deficits:   None   Supports:   Family     Religion: Religion/Spirituality Are You A Religious Person?: No  Leisure/Recreation: Leisure / Recreation Do You  Have Hobbies?: No  Exercise/Diet: Exercise/Diet Do You Exercise?: No Have You Gained or Lost A Significant Amount of Weight in the Past Six Months?: No Do You Follow a Special Diet?: No Do You Have Any Trouble Sleeping?: No  CCA Employment/Education Employment/Work Situation: Employment / Work Situation Employment Situation: Unemployed Has Patient ever Been in Equities trader?: No  Education: Education Is Patient Currently Attending School?: No Did You Have An Individualized Education Program (IIEP): No Did You Have Any Difficulty At [employer]?: No Patient's Education Has Been Impacted by Current Illness: No  CCA Family/Childhood History Family and Relationship History: Family history Marital status: Single Does patient have children?: No  Childhood History:  Childhood History By whom was/is the patient raised?: Mother Did patient suffer any verbal/emotional/physical/sexual abuse as a child?: No Did patient suffer from severe childhood neglect?: No Has patient ever  been sexually abused/assaulted/raped as an adolescent or adult?: No Was the patient ever a victim of a crime or a disaster?: No Witnessed domestic violence?: No Has patient been affected by domestic violence as an adult?: No  CCA Substance Use Alcohol/Drug Use: Alcohol / Drug Use Pain Medications: See PTA Prescriptions: See PTA Over the Counter: See PTA History of alcohol / drug use?: Yes Longest period of sobriety (when/how long): Unable to quantify Substance #1 Name of Substance 1: Alcohol 1 - Last Use / Amount: 12/26/2022   ASAM's:  Six Dimensions of Multidimensional Assessment  Dimension 1:  Acute Intoxication and/or Withdrawal Potential:      Dimension 2:  Biomedical Conditions and Complications:      Dimension 3:  Emotional, Behavioral, or Cognitive Conditions and Complications:     Dimension 4:  Readiness to Change:     Dimension 5:  Relapse, Continued use, or Continued Problem Potential:     Dimension 6:  Recovery/Living Environment:     ASAM Severity Score:    ASAM Recommended Level of Treatment:     Substance use Disorder (SUD)   Recommendations for Services/Supports/Treatments:   Discharge Disposition:   DSM5 Diagnoses: Patient Active Problem List   Diagnosis Date Noted   Schizophreniform disorder (HCC) 12/27/2022   Acute psychosis (HCC) 05/25/2019   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

## 2022-12-27 NOTE — Tx Team (Signed)
Initial Treatment Plan 12/27/2022 3:45 PM [name] [id number]    PATIENT STRESSORS: Legal issue   Medication change or noncompliance   Occupational concerns     PATIENT STRENGTHS: Forensic psychologist fund of knowledge    PATIENT IDENTIFIED PROBLEMS:     Depression Bad thoughts                  DISCHARGE CRITERIA:  Ability to meet basic life and health needs Improved stabilization in mood, thinking, and/or behavior Motivation to continue treatment in a less acute level of care Reduction of life-threatening or endangering symptoms to within safe limits Safe-care adequate arrangements made  PRELIMINARY DISCHARGE PLAN: Attend PHP/IOP Attend 12-step recovery group Participate in family therapy Placement in alternative living arrangements  PATIENT/FAMILY INVOLVEMENT: This treatment plan has been presented to and reviewed with the patient, Ethan Sutton, and/or family member, .  The patient and family have been given the opportunity to ask questions and make suggestions.  Malva Limes, RN 12/27/2022, 3:45 PM

## 2022-12-27 NOTE — ED Provider Notes (Signed)
Emergency Medicine Observation Re-evaluation Note  Kaulana Bunker is a 37 y.o. male, seen on rounds today.  Pt initially presented to the ED for complaints of Anxiety Currently, the patient is resting, voices no medical complaints.  Physical Exam  BP 119/85   Pulse 97   Temp 98.5 F (36.9 C) (Oral)   Resp 18   SpO2 100%  Physical Exam General: Resting in no acute distress Cardiac: No cyanosis Lungs: Equal rise and fall Psych: Not agitated  ED Course / MDM  EKG:   I have reviewed the labs performed to date as well as medications administered while in observation.  Recent changes in the last 24 hours include no events overnight.  Plan  Current plan is for psychiatric disposition.    Irean Hong, MD 12/27/22 [phone]

## 2022-12-28 DIAGNOSIS — F2081 Schizophreniform disorder: Secondary | ICD-10-CM | POA: Diagnosis not present

## 2022-12-28 NOTE — Progress Notes (Incomplete)
Pt has been isolating in room. Pt presents disheveled, when on unit paranoia noted as evidenced by hypervigilant of surroundings. A

## 2022-12-28 NOTE — Progress Notes (Signed)
   12/28/22 1200  Psych Admission Type (Psych Patients Only)  Admission Status Voluntary  Psychosocial Assessment  Patient Complaints Suspiciousness  Eye Contact Brief  Facial Expression Flat  Affect Anxious  Speech Slow  Interaction Guarded;Isolative;Minimal  Motor Activity Slow  Appearance/Hygiene In scrubs  Behavior Characteristics Anxious  Mood Preoccupied  Thought Process  Coherency WDL  Content Paranoia  Delusions Paranoid  Perception Hallucinations  Hallucination Auditory  Judgment Impaired  Confusion WDL  Danger to Self  Current suicidal ideation? Denies  Danger to Others  Danger to Others None reported or observed

## 2022-12-28 NOTE — Progress Notes (Signed)
   12/28/22 0530  15 Minute Checks  Location Bedroom  Visual Appearance Calm  Behavior Sleeping  Sleep (Behavioral Health Patients Only)  Calculate sleep? (Click Yes once per 24 hr at 0600 safety check) Yes  Documented sleep last 24 hours 8.25

## 2022-12-28 NOTE — Progress Notes (Signed)
[facility] MD Progress Note  12/28/2022 10:01 AM Shahzain Bireley  MRN:  [id number]  Principal Problem: Schizophreniform disorder [facility]) Diagnosis: Principal Problem:   Schizophreniform disorder [facility])  Mr. Rorrer is a 37yo M with a history of psychosis, who was admitted to [facility] due to paranoia, not feeling safe from harming self or others    Interval History Patient was seen today for re-evaluation.  Nursing reports no events overnight. The patient has no issues with performing ADLs.  Patient has been medication compliant.    Patient was seen and interviewed by attending psychiatrist. Chart reviewed. Patient discussed during treatment team rounds.  Subjective:  On assessment patient reports feeling "better today". Reports that he slept better, his thoughts are raising less, he feels safe here in the hospital and less paranoid overall. He denies feeling depressed, anxious, . He denies auditory or visual hallucinations. He denies thoughts or plans of hurting self or others. He reports no side effects from initiated last night Zyprexa He denies any physical complaints.  Labs: no new results for review.   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 Previous psychiatric hospitalizations: [facility] 2021 Previous outpatient psychiatrist: unknown Therapist/Counselor: none History of prior suicide attempts: denies Non-suicidal self-injurious behaviors: denies History of violence: denies Previous psych medication: Risperidone Current psych medications: none   Past Medical History:  Past Medical History:  Diagnosis Date   Schizophrenia (HCC)     Past Surgical History:  Procedure Laterality Date   NO PAST SURGERIES     Family History: History reviewed. No pertinent family history. Family Psychiatric  History:  Schizophrenia in two brothers. Patient denies suicide in family members.  Social History:   Social History   Substance and Sexual Activity  Alcohol Use Not Currently   Comment: ive been in prison for a year i haven't used any     Social History   Substance and Sexual Activity  Drug Use Not Currently    Social History   Socioeconomic History   Marital status: Single    Spouse name: Not on file   Number of children: Not on file   Years of education: Not on file   Highest education level: Not on file  Occupational History   Not on file  Tobacco Use   Smoking status: Former    Current packs/day: 0.00    Types: Cigarettes    Quit date: 2019    Years since quitting: 5.9   Smokeless tobacco: Never  Vaping Use   Vaping status: Never Used  Substance and Sexual Activity   Alcohol use: Not Currently    Comment: ive been in prison for a year i haven't used any   Drug use: Not Currently   Sexual activity: Not Currently    Birth control/protection: Condom, Abstinence  Other Topics Concern   Not on file  Social History Narrative   Not on file   Social Determinants of Health   Financial Resource Strain: Not on file  Food Insecurity: No Food Insecurity (12/27/2022)   Hunger Vital Sign    Worried About Running Out of Food in the Last Year: Never true    Ran Out of Food in the Last Year: Never true  Transportation Needs: No Transportation Needs (12/27/2022)   PRAPARE - Administrator, Civil Service (Medical): No    Lack of Transportation (Non-Medical): No  Physical Activity: Not on file  Stress: Not on file  Social Connections: Not on file   Additional Social History:                         Sleep: Good  Appetite:  Good  Current Medications: Current Facility-Administered Medications  Medication Dose Route Frequency Provider Last Rate Last Admin   acetaminophen (TYLENOL) tablet 650 mg  650 mg Oral Q6H PRN Charm Rings, NP       acetaminophen (TYLENOL) tablet 650 mg  650 mg Oral Q6H PRN Charm Rings, NP       alum & mag  hydroxide-simeth (MAALOX/MYLANTA) 200-200-20 MG/5ML suspension 30 mL  30 mL Oral Q4H PRN Charm Rings, NP       alum & mag hydroxide-simeth (MAALOX/MYLANTA) 200-200-20 MG/5ML suspension 30 mL  30 mL Oral Q4H PRN Charm Rings, NP       diphenhydrAMINE (BENADRYL) capsule 50 mg  50 mg Oral BID PRN Charm Rings, NP       Or   diphenhydrAMINE (BENADRYL) injection 50 mg  50 mg Intramuscular BID PRN Charm Rings, NP       feeding supplement (ENSURE ENLIVE / ENSURE PLUS) liquid 237 mL  237 mL Oral BID BM Attiah, Nadir, MD   237 mL at 12/27/22 1558   magnesium hydroxide (MILK OF MAGNESIA) suspension 30 mL  30 mL Oral Daily PRN Charm Rings, NP       magnesium hydroxide (MILK OF MAGNESIA) suspension 30 mL  30 mL Oral Daily PRN Charm Rings, NP       OLANZapine (ZYPREXA) tablet 10 mg  10 mg Oral BID PRN Charm Rings, NP       Or   OLANZapine (ZYPREXA) injection 10 mg  10 mg Intramuscular BID PRN Charm Rings, NP       OLANZapine (ZYPREXA) tablet 5 mg  5 mg Oral QHS [name], MD   5 mg at 12/27/22 2016   traZODone (DESYREL) tablet 100 mg  100 mg Oral QHS PRN Charm Rings, NP   100 mg at 12/27/22 2016    Lab Results:  Results for orders placed or performed during the hospital encounter of 12/26/22 (from the past 48 hour(s))  Comprehensive metabolic panel     Status: None   Collection Time: 12/26/22  6:13 PM  Result Value Ref Range   Sodium 137 135 - 145 mmol/L   Potassium 3.6 3.5 - 5.1 mmol/L   Chloride 103 98 - 111 mmol/L   CO2 24 22 - 32 mmol/L   Glucose, Bld 99 70 - 99 mg/dL    Comment: Glucose reference range applies only to samples taken after fasting for at least 8 hours.   BUN 13 6 - 20 mg/dL   Creatinine, Ser 3.66 0.61 - 1.24 mg/dL   Calcium 8.9 8.9 - 44.0 mg/dL   Total Protein 7.8 6.5 - 8.1 g/dL   Albumin 4.3 3.5 - 5.0 g/dL   AST 38 15 - 41 U/L   ALT 41 0 - 44 U/L   Alkaline Phosphatase 90 38 - 126 U/L   Total Bilirubin 0.6 <1.2 mg/dL   GFR, Estimated >34  >74 mL/min    Comment: (NOTE) Calculated using the CKD-EPI Creatinine Equation (2021)    Anion gap 10 5 - 15    Comment: Performed at [facility], [address], [place], [place] [address]  Ethanol     Status: None   Collection Time:  12/26/22  6:13 PM  Result Value Ref Range   Alcohol, Ethyl (B) <10 <10 mg/dL    Comment: (NOTE) Lowest detectable limit for serum alcohol is 10 mg/dL.  For medical purposes only. Performed at [facility], [address] [place], [place] [address]   Salicylate level     Status: Abnormal   Collection Time: 12/26/22  6:13 PM  Result Value Ref Range   Salicylate Lvl <7.0 (L) 7.0 - 30.0 mg/dL    Comment: Performed at [facility], [address] [place], [place] [address]  Acetaminophen level     Status: Abnormal   Collection Time: 12/26/22  6:13 PM  Result Value Ref Range   Acetaminophen (Tylenol), Serum <10 (L) 10 - 30 ug/mL    Comment: (NOTE) Therapeutic concentrations vary significantly. A range of 10-30 ug/mL  may be an effective concentration for many patients. However, some  are best treated at concentrations outside of this range. Acetaminophen concentrations >150 ug/mL at 4 hours after ingestion  and >50 ug/mL at 12 hours after ingestion are often associated with  toxic reactions.  Performed at [facility], [address] [place], [place] [address]   cbc     Status: None   Collection Time: 12/26/22  6:13 PM  Result Value Ref Range   WBC 7.5 4.0 - 10.5 K/uL   RBC 4.40 4.22 - 5.81 MIL/uL   Hemoglobin 13.5 13.0 - 17.0 g/dL   HCT 27.2 53.6 - 64.4 %   MCV 93.6 80.0 - 100.0 fL   MCH 30.7 26.0 - 34.0 pg   MCHC 32.8 30.0 - 36.0 g/dL   RDW 03.4 74.2 - 59.5 %   Platelets 309 150 - 400 K/uL   nRBC 0.0 0.0 - 0.2 %    Comment: Performed at [facility], [address], [place], [place] [address]  Urine Drug Screen, Qualitative     Status: None   Collection Time: 12/26/22  6:14 PM  Result  Value Ref Range   Tricyclic, Ur Screen NONE DETECTED NONE DETECTED   Amphetamines, Ur Screen NONE DETECTED NONE DETECTED   MDMA (Ecstasy)Ur Screen NONE DETECTED NONE DETECTED   Cocaine Metabolite,Ur [place] NONE DETECTED NONE DETECTED   Opiate, Ur Screen NONE DETECTED NONE DETECTED   Phencyclidine (PCP) Ur S NONE DETECTED NONE DETECTED   Cannabinoid 50 Ng, Ur [place] NONE DETECTED NONE DETECTED   Barbiturates, Ur Screen NONE DETECTED NONE DETECTED   Benzodiazepine, Ur Scrn NONE DETECTED NONE DETECTED   Methadone Scn, Ur NONE DETECTED NONE DETECTED    Comment: (NOTE) Tricyclics + metabolites, urine    Cutoff 1000 ng/mL Amphetamines + metabolites, urine  Cutoff 1000 ng/mL MDMA (Ecstasy), urine              Cutoff 500 ng/mL Cocaine Metabolite, urine          Cutoff 300 ng/mL Opiate + metabolites, urine        Cutoff 300 ng/mL Phencyclidine (PCP), urine         Cutoff 25 ng/mL Cannabinoid, urine                 Cutoff 50 ng/mL Barbiturates + metabolites, urine  Cutoff 200 ng/mL Benzodiazepine, urine              Cutoff 200 ng/mL Methadone, urine                   Cutoff 300 ng/mL  The urine drug screen provides  only a preliminary, unconfirmed analytical test result and should not be used for non-medical purposes. Clinical consideration and professional judgment should be applied to any positive drug screen result due to possible interfering substances. A more specific alternate chemical method must be used in order to obtain a confirmed analytical result. Gas chromatography / mass spectrometry (GC/MS) is the preferred confirm atory method. Performed at [facility], [address] [place], [place] [address]     Blood Alcohol level:  Lab Results  Component Value Date   [facility] <10 12/26/2022   ETH <10 05/25/2019    Metabolic Disorder Labs: No results found for: "HGBA1C", "MPG" No results found for: "PROLACTIN" No results found for: "CHOL", "TRIG", "HDL", "CHOLHDL", "VLDL",  "LDLCALC"  Physical Findings: AIMS:  , ,  ,  ,    CIWA:    COWS:     Musculoskeletal: Strength & Muscle Tone: within normal limits Gait & Station: normal Patient leans: N/A  Psychiatric Specialty Exam:  Appearance:  AAM, appearing stated age,  wearing appropriate to the situation casual/hospital clothes, with fair grooming and hygiene. Normal level of alertness and appropriate facial expression.   Attitude/Behavior: calm, cooperative, engaging with appropriate eye contact.   Motor: WNL; dyskinesias not evident. Gait appears in full range.   Speech: spontaneous, clear, coherent, normal comprehension.   Mood: "better"   Affect: restricted, blunted.   Thought process: patient appears coherent, goal-directed, linear with questions; some thought blocking observed.   Thought content: patient denies suicidal thoughts, denies homicidal thoughts; expresses paranoid delusions.   Thought perception: patient reports racing thoughts with some improvement today; patient denies auditory and visual hallucinations. Did not appear internally stimulated.   Cognition: patient is alert and oriented in self, place, date.   Insight: limited, in regards of understanding of presence, nature, cause, and significance of mental or emotional problem.   Judgement: questionable, in regards of ability to make good decisions concerning the appropriate thing to do in various situations, including ability to form opinions regarding their mental health condition.      Physical Exam: Physical Exam ROS Blood pressure 115/75, pulse 82, temperature 98 F (36.7 C), temperature source Oral, resp. rate 18, height 5\' 6"  (1.676 m), weight 68 kg, SpO2 100%. Body mass index is 24.21 kg/m.   Treatment Plan Summary: Daily contact with patient to assess and evaluate symptoms and progress in treatment and Medication management  36yo M with a history of psychosis, who was admitted to [facility] due to paranoia, not feeling  safe from harming self or others. Patient reports paranoid delusions and intrusive thoughts that he can hurt self of others without active suicidal or homicidal ideations. Patient denies recent drug use. 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  Patient reports partial improvement of paranoia and sleep after initiated on Zyprexa.   Diagnoses/ Active problems: -Schizophrenia.   PLAN:  Safety and Monitoring: continue inpatient psych admission; 15-minute checks; daily contact with patient to assess and evaluate symptoms and progress in treatment; psychoeducation.Vital signs: q12 hours. Precautions: suicide, elopement, and assault. Placed on room lock out for meals, snacks and groups.  Psychiatric Problems: - Continue Olanzapine 5 mg PO at bedtime for psychosis.  - Metabolic profile and EKG monitoring obtained while on an atypical antipsychotic .  - Encouraged patient to participate in unit milieu and in scheduled group therapies   Medical Problems: none  PRN medications:  acetaminophen, acetaminophen, alum & mag hydroxide-simeth, alum & mag hydroxide-simeth, diphenhydrAMINE **OR** diphenhydrAMINE,  magnesium hydroxide, magnesium hydroxide, OLANZapine **OR** OLANZapine, traZODone  Pertinent Labs: no new labs ordered today  Consults: No new consults placed since yesterday    Discharge Planning: -Social work and case management to assist with discharge planning and identification of hospital follow-up needs prior to discharge -Estimated LOS: 5-7 days -Discharge Concerns: Need to establish a safety plan; Medication compliance and effectiveness -Discharge Goals: Return home with outpatient referrals for mental health follow-up including medication management/psychotherapy  Total Time Spent in Direct Patient Care:  I personally spent 35 minutes on the unit in direct patient care. The direct patient care time included face-to-face time with the patient, reviewing the  patient's chart, communicating with other professionals, and coordinating care. Greater than 50% of this time was spent in counseling or coordinating care with the patient regarding goals of hospitalization, psycho-education, and discharge planning needs.   [name], MD 12/28/2022, 10:01 AM

## 2022-12-28 NOTE — Plan of Care (Signed)

## 2022-12-28 NOTE — BH Assessment (Signed)
CWS attempted [facility] Assessment twice. Patient was sleeping both time. CSW will try again tomorrow.  Romy Mcgue LCSWA

## 2022-12-29 DIAGNOSIS — F2081 Schizophreniform disorder: Secondary | ICD-10-CM | POA: Diagnosis not present

## 2022-12-29 MED ORDER — OLANZAPINE 10 MG PO TABS
10.0000 mg | ORAL_TABLET | Freq: Every day | ORAL | Status: DC
  Administered 2022-12-29 – 2022-12-31 (×3): 10 mg via ORAL
  Filled 2022-12-29 (×7): qty 1

## 2022-12-29 NOTE — Progress Notes (Signed)
   12/29/22 0400  Psych Admission Type (Psych Patients Only)  Admission Status Voluntary  Psychosocial Assessment  Patient Complaints Suspiciousness  Eye Contact Brief  Facial Expression Flat  Affect Anxious  Speech Slow  Interaction Guarded;Isolative;Minimal  Motor Activity Slow  Appearance/Hygiene In scrubs  Behavior Characteristics Anxious  Mood Preoccupied  Thought Process  Coherency WDL  Content Paranoia  Delusions Paranoid  Perception Hallucinations  Hallucination Auditory  Judgment Impaired  Confusion WDL  Danger to Self  Current suicidal ideation? Denies  Agreement Not to Harm Self Yes  Description of Agreement verbal  Danger to Others  Danger to Others None reported or observed

## 2022-12-29 NOTE — Progress Notes (Signed)
[facility] MD Progress Note  12/29/2022 9:21 AM Emrah Corriea  MRN:  [id number]  Principal Problem: Schizophreniform disorder [facility]) Diagnosis: Principal Problem:   Schizophreniform disorder [facility])  Mr. Bato is a 37yo M with a history of psychosis, who was admitted to [facility] due to paranoia, not feeling safe from harming self or others    Interval History Patient was seen today for re-evaluation.  Nursing reports no events overnight. The patient has no issues with performing ADLs.  Patient has been medication compliant.    Patient was seen and interviewed by attending psychiatrist. Chart reviewed. Patient discussed during treatment team rounds.  Subjective:  On assessment patient reports feeling "better today". Reports partial improvement of paranoia and racing thoughts, although "they are still there some".  Says he slept better too. He denies feeling depressed, anxious. He denies auditory or visual hallucinations. He denies thoughts or plans of hurting self or others. He reports no side effects from Zyprexa and is agreeable to the dose increase tonight to target remaining paranoia. He denies any physical complaints.  Labs: no new results for review.   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 Previous psychiatric hospitalizations: [facility] 2021 Previous outpatient psychiatrist: unknown Therapist/Counselor: none History of prior suicide attempts: denies Non-suicidal self-injurious behaviors: denies History of violence: denies Previous psych medication: Risperidone Current psych medications: none   Past Medical History:  Past Medical History:  Diagnosis Date   Schizophrenia (HCC)     Past Surgical History:  Procedure Laterality Date   NO PAST SURGERIES     Family History: History reviewed. No pertinent family history. Family Psychiatric  History:  Schizophrenia in two  brothers. Patient denies suicide in family members.  Social History:  Social History   Substance and Sexual Activity  Alcohol Use Not Currently   Comment: ive been in prison for a year i haven't used any     Social History   Substance and Sexual Activity  Drug Use Not Currently    Social History   Socioeconomic History   Marital status: Single    Spouse name: Not on file   Number of children: Not on file   Years of education: Not on file   Highest education level: Not on file  Occupational History   Not on file  Tobacco Use   Smoking status: Former    Current packs/day: 0.00    Types: Cigarettes    Quit date: 2019    Years since quitting: 5.9   Smokeless tobacco: Never  Vaping Use   Vaping status: Never Used  Substance and Sexual Activity   Alcohol use: Not Currently    Comment: ive been in prison for a year i haven't used any   Drug use: Not Currently   Sexual activity: Not Currently    Birth control/protection: Condom, Abstinence  Other Topics Concern   Not on file  Social History Narrative   Not on file   Social Determinants of Health   Financial Resource Strain: Not on file  Food Insecurity: No Food Insecurity (12/27/2022)   Hunger Vital Sign    Worried About Running Out of Food in the Last Year: Never true    Ran Out of Food in the Last Year: Never true  Transportation Needs: No Transportation Needs (12/27/2022)   PRAPARE - Administrator, Civil Service (Medical): No    Lack of Transportation (Non-Medical): No  Physical Activity: Not on  file  Stress: Not on file  Social Connections: Not on file   Additional Social History:                         Sleep: Good  Appetite:  Good  Current Medications: Current Facility-Administered Medications  Medication Dose Route Frequency Provider Last Rate Last Admin   acetaminophen (TYLENOL) tablet 650 mg  650 mg Oral Q6H PRN Charm Rings, NP       acetaminophen (TYLENOL) tablet 650  mg  650 mg Oral Q6H PRN Charm Rings, NP       alum & mag hydroxide-simeth (MAALOX/MYLANTA) 200-200-20 MG/5ML suspension 30 mL  30 mL Oral Q4H PRN Charm Rings, NP       alum & mag hydroxide-simeth (MAALOX/MYLANTA) 200-200-20 MG/5ML suspension 30 mL  30 mL Oral Q4H PRN Charm Rings, NP       diphenhydrAMINE (BENADRYL) capsule 50 mg  50 mg Oral BID PRN Charm Rings, NP       Or   diphenhydrAMINE (BENADRYL) injection 50 mg  50 mg Intramuscular BID PRN Charm Rings, NP       feeding supplement (ENSURE ENLIVE / ENSURE PLUS) liquid 237 mL  237 mL Oral BID BM Attiah, Nadir, MD   237 mL at 12/29/22 0803   magnesium hydroxide (MILK OF MAGNESIA) suspension 30 mL  30 mL Oral Daily PRN Charm Rings, NP       magnesium hydroxide (MILK OF MAGNESIA) suspension 30 mL  30 mL Oral Daily PRN Charm Rings, NP       OLANZapine (ZYPREXA) tablet 10 mg  10 mg Oral BID PRN Charm Rings, NP       Or   OLANZapine (ZYPREXA) injection 10 mg  10 mg Intramuscular BID PRN Charm Rings, NP       OLANZapine (ZYPREXA) tablet 10 mg  10 mg Oral QHS [name], MD       traZODone (DESYREL) tablet 100 mg  100 mg Oral QHS PRN Charm Rings, NP   100 mg at 12/28/22 2028    Lab Results:  No results found for this or any previous visit (from the past 48 hour(s)).   Blood Alcohol level:  Lab Results  Component Value Date   ETH <10 12/26/2022   ETH <10 05/25/2019    Metabolic Disorder Labs: No results found for: "HGBA1C", "MPG" No results found for: "PROLACTIN" No results found for: "CHOL", "TRIG", "HDL", "CHOLHDL", "VLDL", "LDLCALC"  Physical Findings: AIMS:  , ,  ,  ,    CIWA:    COWS:     Musculoskeletal: Strength & Muscle Tone: within normal limits Gait & Station: normal Patient leans: N/A  Psychiatric Specialty Exam:  Appearance:  AAM, appearing stated age,  wearing appropriate to the situation casual/hospital clothes, with fair grooming and hygiene. Normal level of alertness and  appropriate facial expression.   Attitude/Behavior: calm, cooperative, engaging with appropriate eye contact.   Motor: WNL; dyskinesias not evident. Gait appears in full range.   Speech: spontaneous, clear, coherent, normal comprehension.   Mood: "better"   Affect: restricted, blunted.   Thought process: patient appears coherent, goal-directed, linear with questions; some thought blocking observed.   Thought content: patient denies suicidal thoughts, denies homicidal thoughts; expresses paranoid delusions.   Thought perception: patient reports racing thoughts with some improvement today; patient denies auditory and visual hallucinations. Did not appear internally stimulated.  Cognition: patient is alert and oriented in self, place, date.   Insight: limited, in regards of understanding of presence, nature, cause, and significance of mental or emotional problem.   Judgement: questionable, in regards of ability to make good decisions concerning the appropriate thing to do in various situations, including ability to form opinions regarding their mental health condition.      Physical Exam: Physical Exam ROS Blood pressure 115/75, pulse 82, temperature 98 F (36.7 C), temperature source Oral, resp. rate 18, height 5\' 6"  (1.676 m), weight 68 kg, SpO2 100%. Body mass index is 24.21 kg/m.   Treatment Plan Summary: Daily contact with patient to assess and evaluate symptoms and progress in treatment and Medication management  36yo M with a history of psychosis, who was admitted to [facility] due to paranoia, not feeling safe from harming self or others. Patient reports paranoid delusions and intrusive thoughts that he can hurt self of others without active suicidal or homicidal ideations. Patient denies recent drug use. 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  Patient continues to report partial improvement of paranoia and sleep with Zyprexa; the dose will be adjusted  higher tonight for remaining psychosis.   Diagnoses/ Active problems: -Schizophrenia.   PLAN:  Safety and Monitoring: continue inpatient psych admission; 15-minute checks; daily contact with patient to assess and evaluate symptoms and progress in treatment; psychoeducation.Vital signs: q12 hours. Precautions: suicide, elopement, and assault. Placed on room lock out for meals, snacks and groups.  Psychiatric Problems: - increase Olanzapine to 10 mg PO at bedtime for psychosis.  - Metabolic profile and EKG monitoring obtained while on an atypical antipsychotic .  - Encouraged patient to participate in unit milieu and in scheduled group therapies   Medical Problems: none  PRN medications:  acetaminophen, acetaminophen, alum & mag hydroxide-simeth, alum & mag hydroxide-simeth, diphenhydrAMINE **OR** diphenhydrAMINE, magnesium hydroxide, magnesium hydroxide, OLANZapine **OR** OLANZapine, traZODone  Pertinent Labs: no new labs ordered today  Consults: No new consults placed since yesterday    Discharge Planning: -Social work and case management to assist with discharge planning and identification of hospital follow-up needs prior to discharge -Estimated LOS: 5 days -Discharge Concerns: Need to establish a safety plan; Medication compliance and effectiveness -Discharge Goals: Return home with outpatient referrals for mental health follow-up including medication management/psychotherapy  Total Time Spent in Direct Patient Care:  I personally spent 35 minutes on the unit in direct patient care. The direct patient care time included face-to-face time with the patient, reviewing the patient's chart, communicating with other professionals, and coordinating care. Greater than 50% of this time was spent in counseling or coordinating care with the patient regarding goals of hospitalization, psycho-education, and discharge planning needs.   [name], MD 12/29/2022, 9:21 AM

## 2022-12-29 NOTE — Progress Notes (Signed)
CSW visited patient's room twice (morning/afternoon) to conduct initial adult assessment. Patient refused to complete on both occasions, stating he will be open tomorrow, 12/30/22. No additional concerns noted.

## 2022-12-29 NOTE — Progress Notes (Signed)
   12/29/22 1000  Psych Admission Type (Psych Patients Only)  Admission Status Voluntary  Psychosocial Assessment  Patient Complaints Suspiciousness  Eye Contact Avoids  Facial Expression Flat  Affect Anxious  Speech Slow  Interaction Guarded;Isolative;Minimal  Motor Activity Slow  Appearance/Hygiene In scrubs  Behavior Characteristics Anxious  Mood Preoccupied  Thought Process  Coherency WDL  Content Paranoia  Delusions Paranoid  Perception Hallucinations  Hallucination Auditory  Judgment Impaired  Confusion WDL  Danger to Self  Current suicidal ideation? Denies  Agreement Not to Harm Self Yes  Description of Agreement verbal  Danger to Others  Danger to Others None reported or observed

## 2022-12-29 NOTE — Plan of Care (Signed)
  Problem: Education: Goal: Knowledge of [facility] Education information/materials will improve Outcome: Progressing Goal: Emotional status will improve Outcome: Progressing Goal: Mental status will improve Outcome: Progressing Goal: Verbalization of understanding the information provided will improve Outcome: Progressing   

## 2022-12-30 ENCOUNTER — Encounter (HOSPITAL_COMMUNITY): Payer: Self-pay

## 2022-12-30 DIAGNOSIS — F2081 Schizophreniform disorder: Secondary | ICD-10-CM | POA: Diagnosis not present

## 2022-12-30 NOTE — Progress Notes (Signed)
Patient refused PM vitals.

## 2022-12-30 NOTE — Plan of Care (Signed)
°  Problem: Education: °Goal: Emotional status will improve °Outcome: Progressing °Goal: Mental status will improve °Outcome: Progressing °Goal: Verbalization of understanding the information provided will improve °Outcome: Progressing °  °

## 2022-12-30 NOTE — Group Note (Signed)
Recreation Therapy Group Note   Group Topic:Healthy Decision Making  Group Date: 12/30/2022 Start Time: 1039 End Time: 1105 Facilitators: Audine Mangione-McCall, LRT,CTRS Location: [address] Dayroom   Group Topic: Decision Making, Problem Solving, Communication  Goal Area(s) Addresses:  Patient will effectively work with peer towards shared goal.  Patient will identify factors that guided their decision making.  Patient will pro-socially communicate ideas during group session.   Intervention: Survival Scenario - pencil, paper  Group Description: Patients were given a scenario that they were going to be stranded on a deserted [place] for several months before being rescued. Writer tasked them with making a list of 15 things they would choose to bring with them for "survival". The list of items was prioritized most important to least. Each patient would come up with their own list, then work together to create a new list of 15 items while in a group of 3-5 peers. LRT discussed each person's list and how it differed from others. The debrief included discussion of priorities, good decisions versus bad decisions, and how it is important to think before acting so we can make the best decision possible. LRT tied the concept of effective communication among group members to patient's support systems outside of the hospital and its benefit post discharge.  Education: Pharmacist, community, Priorities, Support System, Discharge Planning   Education Outcome: Acknowledges education/In group clarification/Needs additional education   Affect/Mood: N/A   Participation Level: Did not attend    Clinical Observations/Individualized Feedback:      Plan: Continue to engage patient in RT group sessions 2-3x/week.   Lamine Laton-McCall, LRT,CTRS  12/30/2022 1:38 PM

## 2022-12-30 NOTE — Progress Notes (Signed)
   12/30/22 1000  Psych Admission Type (Psych Patients Only)  Admission Status Voluntary  Psychosocial Assessment  Patient Complaints Suspiciousness  Eye Contact Brief  Facial Expression Flat  Affect Anxious  Speech Logical/coherent  Interaction Guarded;Isolative  Motor Activity Slow  Appearance/Hygiene In scrubs;Unremarkable  Behavior Characteristics Anxious  Mood Preoccupied  Thought Process  Coherency WDL  Content Paranoia  Delusions Paranoid  Perception Hallucinations  Hallucination Auditory  Judgment Impaired  Confusion None  Danger to Self  Current suicidal ideation? Denies  Agreement Not to Harm Self Yes  Description of Agreement verbal  Danger to Others  Danger to Others None reported or observed

## 2022-12-30 NOTE — Progress Notes (Signed)
Recreation Therapy Notes  INPATIENT RECREATION THERAPY ASSESSMENT  Patient Details Name: Ethan Sutton MRN: [id number] DOB: 14-Jan-1986 Today's Date: 12/30/2022       Information Obtained From: Patient  Able to Participate in Assessment/Interview: Yes (Patient was unsure about his responses he was giving. Pt was hesitant and would second guess himself during assessment.)  Patient Presentation: Alert  Reason for Admission (Per Patient): Other (Comments) ("bad thoughts")  Patient Stressors: Other (Comment) (None identified)  Coping Skills:   Music, Substance Abuse, Prayer  Leisure Interests (2+):  Exercise - Walking  Frequency of Recreation/Participation: Other (Comment) (Daily)  Awareness of Community Resources:  Yes  Community Resources:  [place], Public affairs consultant  Current Use: Yes  If no, Barriers?:    Expressed Interest in [employer] Information: No  County of Residence:  [place]  Patient Main Form of Transportation: Other (Comment) ("wherever I can ride")  Patient Strengths:  Listening, Paying attention  Patient Identified Areas of Improvement:  "none right now"  Patient Goal for Hospitalization:  "get better"  Current SI (including self-harm):  No  Current HI:  No  Current AVH: No  Staff Intervention Plan: Group Attendance, Collaborate with Interdisciplinary Treatment Team  Consent to Intern Participation: N/A   Ethan Sutton, LRT,CTRS Ethan Sutton 12/30/2022, 2:24 PM

## 2022-12-30 NOTE — Progress Notes (Signed)
   12/29/22 2000  Psych Admission Type (Psych Patients Only)  Admission Status Voluntary  Psychosocial Assessment  Patient Complaints Suspiciousness  Eye Contact Avoids  Facial Expression Flat  Affect Anxious  Speech Slow  Interaction Guarded;Isolative  Motor Activity Slow  Appearance/Hygiene Unremarkable  Behavior Characteristics Anxious  Mood Preoccupied  Danger to Self  Current suicidal ideation?  (Denies)  Agreement Not to Harm Self Yes

## 2022-12-30 NOTE — Progress Notes (Signed)
[facility] MD Progress Note  12/30/2022 5:18 PM Phyllis Darnley  MRN:  [id number]  Principal Problem: Schizophreniform disorder [facility]) Diagnosis: Principal Problem:   Schizophreniform disorder [facility])  Reason for admission: Mr. Kurata is a 37yo M with a history of psychosis, who was admitted to [facility] due to paranoia, not feeling safe from harming self or others   Today's assessment note: On assessment today, patient reports his mood is less depressed he rates depression as #4/10, with 10 being high severity.  Reports      his intrusive thoughts of harming himself and others has greatly reduced.  Patient is alert and oriented to name, place, and partly situation.  Chart reviewed and findings shared with the treatment team and consult with attending psychiatrist.  Upon entering the room, patient sat on the bed, walked around and squat on the floor multiple times.  Then appears to be responding to in internal stimuli, talking into the air.  However, able to answer some of the assessment questions appropriately.  Nursing staff report no requirement for agitation protocol or anxiety.  Patient is able to perform his ADLs and has been compliant with his medications.  No signs of EPS observed, and we will monitor metabolic profile as patient is on atypical antipsychotic.  Reports that anxiety is manageable level and rates anxiety as #5/10, with 10 being high severity Sleep is better.  Nursing staff report patient sleeping about 10 hours last night Appetite is okay Concentration is fair Energy level is adequate Patient denies SI, HI, or AVH Denies having psychotic symptoms, however, patient appears to be responding to internal stimuli during assessment.  Denies having side effects to current psychiatric medications.   We discussed compliance to current medication regimen.   Labs: no new results for review.  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 Previous psychiatric hospitalizations: [facility] 2021 Previous outpatient psychiatrist: unknown Therapist/Counselor: none History of prior suicide attempts: denies Non-suicidal self-injurious behaviors: denies History of violence: denies Previous psych medication: Risperidone Current psych medications: none  Past Medical History:  Past Medical History:  Diagnosis Date   Schizophrenia (HCC)     Past Surgical History:  Procedure Laterality Date   NO PAST SURGERIES     Family History: History reviewed. No pertinent family history. Family Psychiatric  History:  Schizophrenia in two brothers. Patient denies suicide in family members.  Social History:  Social History   Substance and Sexual Activity  Alcohol Use Not Currently   Comment: ive been in prison for a year i haven't used any     Social History   Substance and Sexual Activity  Drug Use Not Currently    Social History   Socioeconomic History   Marital status: Single    Spouse name: Not on file   Number of children: Not on file   Years of education: Not on file   Highest education level: Not on file  Occupational History   Not on file  Tobacco Use   Smoking status: Former    Current packs/day: 0.00    Types: Cigarettes    Quit date: 2019    Years since quitting: 5.9   Smokeless tobacco: Never  Vaping Use   Vaping status: Never Used  Substance and Sexual Activity   Alcohol use: Not Currently    Comment: ive been in prison for a year i haven't used any   Drug use: Not Currently   Sexual  activity: Not Currently    Birth control/protection: Condom, Abstinence  Other Topics Concern   Not on file  Social History Narrative   Not on file   Social Determinants of Health   Financial Resource Strain: Not on file  Food Insecurity: No Food Insecurity (12/27/2022)   Hunger Vital Sign    Worried About Running Out of Food in the Last Year: Never true    Ran Out of Food  in the Last Year: Never true  Transportation Needs: No Transportation Needs (12/27/2022)   PRAPARE - Administrator, Civil Service (Medical): No    Lack of Transportation (Non-Medical): No  Physical Activity: Not on file  Stress: Not on file  Social Connections: Not on file   Additional Social History:    Sleep: Good  Appetite:  Good  Current Medications: Current Facility-Administered Medications  Medication Dose Route Frequency Provider Last Rate Last Admin   acetaminophen (TYLENOL) tablet 650 mg  650 mg Oral Q6H PRN Charm Rings, NP       acetaminophen (TYLENOL) tablet 650 mg  650 mg Oral Q6H PRN Charm Rings, NP       alum & mag hydroxide-simeth (MAALOX/MYLANTA) 200-200-20 MG/5ML suspension 30 mL  30 mL Oral Q4H PRN Charm Rings, NP       alum & mag hydroxide-simeth (MAALOX/MYLANTA) 200-200-20 MG/5ML suspension 30 mL  30 mL Oral Q4H PRN Charm Rings, NP       diphenhydrAMINE (BENADRYL) capsule 50 mg  50 mg Oral BID PRN Charm Rings, NP       Or   diphenhydrAMINE (BENADRYL) injection 50 mg  50 mg Intramuscular BID PRN Charm Rings, NP       feeding supplement (ENSURE ENLIVE / ENSURE PLUS) liquid 237 mL  237 mL Oral BID BM Attiah, Nadir, MD   237 mL at 12/30/22 1550   magnesium hydroxide (MILK OF MAGNESIA) suspension 30 mL  30 mL Oral Daily PRN Charm Rings, NP       magnesium hydroxide (MILK OF MAGNESIA) suspension 30 mL  30 mL Oral Daily PRN Charm Rings, NP       OLANZapine (ZYPREXA) tablet 10 mg  10 mg Oral BID PRN Charm Rings, NP       Or   OLANZapine (ZYPREXA) injection 10 mg  10 mg Intramuscular BID PRN Charm Rings, NP       OLANZapine (ZYPREXA) tablet 10 mg  10 mg Oral QHS [name], MD   10 mg at 12/29/22 2034   traZODone (DESYREL) tablet 100 mg  100 mg Oral QHS PRN Charm Rings, NP   100 mg at 12/29/22 2034   Lab Results:  No results found for this or any previous visit (from the past 48 hour(s)).  Blood Alcohol level:   Lab Results  Component Value Date   ETH <10 12/26/2022   ETH <10 05/25/2019   Metabolic Disorder Labs: No results found for: "HGBA1C", "MPG" No results found for: "PROLACTIN" No results found for: "CHOL", "TRIG", "HDL", "CHOLHDL", "VLDL", "LDLCALC"  Physical Findings: AIMS:  , ,  ,  ,    CIWA:    COWS:     Musculoskeletal: Strength & Muscle Tone: within normal limits Gait & Station: normal Patient leans: N/A  Psychiatric Specialty Exam:  Appearance:  AAM, appearing stated age,  wearing appropriate to the situation casual/hospital clothes, with fair grooming and hygiene. Normal level of alertness and appropriate facial  expression.   Attitude/Behavior: calm, cooperative, engaging with appropriate eye contact.   Motor: WNL; dyskinesias not evident. Gait appears in full range.   Speech: spontaneous, clear, coherent, normal comprehension.   Mood: "better"   Affect: restricted, blunted.   Thought process: patient appears coherent, goal-directed, linear with questions; some thought blocking observed.   Thought content: patient denies suicidal thoughts, denies homicidal thoughts; expresses paranoid delusions.   Thought perception: patient reports racing thoughts with some improvement today; patient denies auditory and visual hallucinations. Did not appear internally stimulated.   Cognition: patient is alert and oriented in self, place, date.   Insight: limited, in regards of understanding of presence, nature, cause, and significance of mental or emotional problem.   Judgement: questionable, in regards of ability to make good decisions concerning the appropriate thing to do in various situations, including ability to form opinions regarding their mental health condition.   Physical Exam: Physical Exam Vitals and nursing note reviewed.  HENT:     Head: Normocephalic.     Nose: Nose normal.     Mouth/Throat:     Mouth: Mucous membranes are moist.  Eyes:     Extraocular  Movements: Extraocular movements intact.  Cardiovascular:     Pulses: Normal pulses.  Pulmonary:     Effort: Pulmonary effort is normal.  Abdominal:     Comments: Deferred  Genitourinary:    Comments: Deferred Musculoskeletal:        General: Normal range of motion.     Cervical back: Normal range of motion.  Skin:    General: Skin is warm.  Neurological:     General: No focal deficit present.     Mental Status: He is alert and oriented to person, place, and time.  Psychiatric:        Behavior: Behavior normal.    Review of Systems  Constitutional:  Negative for chills and fever.  HENT:  Negative for sore throat.   Eyes:  Negative for blurred vision.  Respiratory:  Negative for cough, sputum production, shortness of breath and wheezing.   Cardiovascular:  Negative for chest pain and palpitations.  Gastrointestinal:  Negative for abdominal pain, constipation, diarrhea, heartburn, nausea and vomiting.  Genitourinary:  Negative for dysuria, frequency and urgency.  Musculoskeletal: Negative.   Skin:  Negative for itching and rash.  Neurological:  Negative for dizziness, tingling, tremors, sensory change and headaches.  Endo/Heme/Allergies:        See allergy listing  Psychiatric/Behavioral:  Positive for depression and hallucinations. Negative for substance abuse and suicidal ideas. The patient is nervous/anxious. The patient does not have insomnia.    Blood pressure 100/68, pulse 81, temperature (!) 97.4 F (36.3 C), temperature source Oral, resp. rate 18, height 5\' 6"  (1.676 m), weight 68 kg, SpO2 100%. Body mass index is 24.21 kg/m.  Treatment Plan Summary: Daily contact with patient to assess and evaluate symptoms and progress in treatment and Medication management  36yo M with a history of psychosis, who was admitted to [facility] due to paranoia, not feeling safe from harming self or others. Patient reports paranoid delusions and intrusive thoughts that he can hurt self of  others without active suicidal or homicidal ideations. Patient denies recent drug use. 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  Patient continues to report partial improvement of paranoia and sleep with Zyprexa; the dose will be adjusted higher tonight for remaining psychosis.   Diagnoses/ Active problems: -Schizophrenia.  PLAN:  Safety and Monitoring: continue inpatient  psych admission; 15-minute checks; daily contact with patient to assess and evaluate symptoms and progress in treatment; psychoeducation.Vital signs: q12 hours. Precautions: suicide, elopement, and assault. Placed on room lock out for meals, snacks and groups.  Psychiatric Problems: -Continue olanzapine 10 mg PO at bedtime for psychosis.  - Metabolic profile and EKG monitoring obtained while on an atypical antipsychotic .  - Encouraged patient to participate in unit milieu and in scheduled group therapies   Medical Problems: none  PRN medications:  acetaminophen, acetaminophen, alum & mag hydroxide-simeth, alum & mag hydroxide-simeth, diphenhydrAMINE **OR** diphenhydrAMINE, magnesium hydroxide, magnesium hydroxide, OLANZapine **OR** OLANZapine, traZODone  Pertinent Labs: no new labs ordered today  Consults: No new consults placed since yesterday    Discharge Planning: -Social work and case management to assist with discharge planning and identification of hospital follow-up needs prior to discharge -Estimated LOS: 5 days -Discharge Concerns: Need to establish a safety plan; Medication compliance and effectiveness -Discharge Goals: Return home with outpatient referrals for mental health follow-up including medication management/psychotherapy  Cecilie Lowers, FNP 12/30/2022, 5:18 PM Patient ID: Orenthial Smigielski, male   DOB: Feb 11, 1985, 37 y.o.   MRN: [id number]

## 2022-12-30 NOTE — Group Note (Signed)
Date:  12/30/2022 Time:  9:57 AM  Group Topic/Focus:  Goals Group:   The focus of this group is to help patients establish daily goals to achieve during treatment and discuss how the patient can incorporate goal setting into their daily lives to aide in recovery. Orientation:   The focus of this group is to educate the patient on the purpose and policies of crisis stabilization and provide a format to answer questions about their admission.  The group details unit policies and expectations of patients while admitted.    Participation Level:  Did Not Attend  Additional Comments:  Patient was encouraged to attend group multiple times.   Paul Torpey T Lorraine Lax 12/30/2022, 9:57 AM

## 2022-12-30 NOTE — [facility] Counselor (Signed)
Adult Comprehensive Assessment  Patient ID: Ethan Sutton, male   DOB: Jan 31, 1985, 37 y.o.   MRN: [id number]  Information Source: Information source: Patient (PSA completed with pt)  Current Stressors:  Patient states their primary concerns and needs for treatment are:: " Iwas having bad thoughts and wanted to help myself and was tearing things up" Patient states their goals for this hospitilization and ongoing recovery are:: " I just want to feel better" Educational / Learning stressors: " No" Employment / Job issues: " No" Family Relationships: " No" Financial / Lack of resources (include bankruptcy): " No" Housing / Lack of housing: " I am homeless and released from jail" Physical health (include injuries & life threatening diseases): " No" Social relationships: " No" Substance abuse: " No" Bereavement / Loss: " No"  Living/Environment/Situation:  Living Arrangements: Non-relatives/Friends Living conditions (as described by patient or guardian): " I am homeless" Who else lives in the home?: " Just me" How long has patient lived in current situation?: " for some years" What is atmosphere in current home: Chaotic  Family History:  Marital status: Single Are you sexually active?: Yes What is your sexual orientation?: "don't know" Has your sexual activity been affected by drugs, alcohol, medication, or emotional stress?: Pt denies. Does patient have children?: No  Childhood History:  By whom was/is the patient raised?: Mother Additional childhood history information: na Description of patient's relationship with caregiver when they were a child: "I don't know" Patient's description of current relationship with people who raised him/her: " None" How were you disciplined when you got in trouble as a child/adolescent?: "whooped" Does patient have siblings?: Yes Number of Siblings: 2 Description of patient's current relationship with siblings: " we don't talk" Did patient suffer  any verbal/emotional/physical/sexual abuse as a child?: No Did patient suffer from severe childhood neglect?: No Has patient ever been sexually abused/assaulted/raped as an adolescent or adult?: No Was the patient ever a victim of a crime or a disaster?: No Has patient been affected by domestic violence as an adult?: No  Education:  Highest grade of school patient has completed: " I can't remember" Currently a student?: No Learning disability?: No  Employment/Work Situation:   Employment Situation: Unemployed Patient's Job has Been Impacted by Current Illness: No What is the Longest Time Patient has Held a Job?: 8 years Where was the Patient Employed at that Time?: "[employer]" Has Patient ever Been in the [employer]?: No  Financial Resources:   Financial resources: No income Does patient have a Lawyer or guardian?: No  Alcohol/Substance Abuse:   What has been your use of drugs/alcohol within the last 12 months?: " none" If attempted suicide, did drugs/alcohol play a role in this?: No Alcohol/Substance Abuse Treatment Hx: Denies past history If yes, describe treatment: na Has alcohol/substance abuse ever caused legal problems?: No  Social Support System:   Conservation officer, nature Support System: Poor Describe Community Support System: " none" Type of faith/religion: " not really" How does patient's faith help to cope with current illness?: " none"  Leisure/Recreation:   Do You Have Hobbies?: No  Strengths/Needs:   What is the patient's perception of their strengths?: " I am good listener" Patient states they can use these personal strengths during their treatment to contribute to their recovery: " I don't know" Patient states these barriers may affect/interfere with their treatment: " I don't know" Patient states these barriers may affect their return to the community: " I don't know" Other important information patient  would like considered in planning for their  treatment: " I don't know"  Discharge Plan:   Currently receiving community mental health services: No Patient states concerns and preferences for aftercare planning are: " I just need some help" Patient states they will know when they are safe and ready for discharge when: " I don't know" Does patient have access to transportation?: No Does patient have financial barriers related to discharge medications?: No Patient description of barriers related to discharge medications: " I don't know" Plan for no access to transportation at discharge: " I don't know" Will patient be returning to same living situation after discharge?: Yes  Summary/Recommendations:   Summary and Recommendations (to be completed by the evaluator): Ethan Sutton is a 37 yo male voluntarily admitted to [facility] after presenting to [facility] ED due to suicidal thoughts and aggressive behaviors. The patient has a hx of schizophrenia.  Pt reported stressor as being homeless. Pt denies SI/HI and would not elaborate on AVH. Pt shared he was recently released from jail after being incarcerated for one year. Patient will benefit from crisis stabilization, medication evaluation, group therapy and psychoeducation, in addition to case management for discharge planning. At discharge it is recommended that Patient adhere to the established discharge plan and continue in treatment. Pt requesting medication management and outpatient therapy following discharge.  Ethan Sutton R. 12/30/2022

## 2022-12-30 NOTE — Group Note (Signed)
LCSW Group Therapy Note   Group Date: 12/30/2022 Start Time: 1300 End Time: 1400   Type of Therapy and Topic:  Group Therapy - Who Am I?  Participation Level:  DID NOT ATTEND      Rogene Houston, LCSWA 12/30/2022  2:30 PM

## 2022-12-30 NOTE — Progress Notes (Signed)
   12/30/22 2200  Psych Admission Type (Psych Patients Only)  Admission Status Voluntary  Psychosocial Assessment  Patient Complaints Suspiciousness  Eye Contact Brief  Facial Expression Flat  Affect Anxious  Speech Argumentative  Interaction Guarded;Defensive  Motor Activity Slow  Appearance/Hygiene In scrubs  Behavior Characteristics Anxious  Mood Preoccupied  Thought Process  Coherency Blocking  Content Blaming others;Paranoia  Delusions Paranoid  Perception Hallucinations  Hallucination Auditory  Judgment Poor  Confusion None  Danger to Self  Current suicidal ideation? Denies  Agreement Not to Harm Self Yes  Description of Agreement verbal  Danger to Others  Danger to Others None reported or observed

## 2022-12-30 NOTE — BH IP Treatment Plan (Signed)
Interdisciplinary Treatment and Diagnostic Plan Update  12/30/2022 Time of Session: 11:10AM [facility] MRN: [id number]  Principal Diagnosis: Schizophreniform disorder [facility])  Secondary Diagnoses: Principal Problem:   Schizophreniform disorder (HCC)   Current Medications:  Current Facility-Administered Medications  Medication Dose Route Frequency Provider Last Rate Last Admin   acetaminophen (TYLENOL) tablet 650 mg  650 mg Oral Q6H PRN Charm Rings, NP       acetaminophen (TYLENOL) tablet 650 mg  650 mg Oral Q6H PRN Charm Rings, NP       alum & mag hydroxide-simeth (MAALOX/MYLANTA) 200-200-20 MG/5ML suspension 30 mL  30 mL Oral Q4H PRN Charm Rings, NP       alum & mag hydroxide-simeth (MAALOX/MYLANTA) 200-200-20 MG/5ML suspension 30 mL  30 mL Oral Q4H PRN Charm Rings, NP       diphenhydrAMINE (BENADRYL) capsule 50 mg  50 mg Oral BID PRN Charm Rings, NP       Or   diphenhydrAMINE (BENADRYL) injection 50 mg  50 mg Intramuscular BID PRN Charm Rings, NP       feeding supplement (ENSURE ENLIVE / ENSURE PLUS) liquid 237 mL  237 mL Oral BID BM Attiah, Nadir, MD   237 mL at 12/30/22 1008   magnesium hydroxide (MILK OF MAGNESIA) suspension 30 mL  30 mL Oral Daily PRN Charm Rings, NP       magnesium hydroxide (MILK OF MAGNESIA) suspension 30 mL  30 mL Oral Daily PRN Charm Rings, NP       OLANZapine (ZYPREXA) tablet 10 mg  10 mg Oral BID PRN Charm Rings, NP       Or   OLANZapine (ZYPREXA) injection 10 mg  10 mg Intramuscular BID PRN Charm Rings, NP       OLANZapine (ZYPREXA) tablet 10 mg  10 mg Oral QHS [name], MD   10 mg at 12/29/22 2034   traZODone (DESYREL) tablet 100 mg  100 mg Oral QHS PRN Charm Rings, NP   100 mg at 12/29/22 2034   PTA Medications: No medications prior to admission.    Patient Stressors: Legal issue   Medication change or noncompliance   Occupational concerns    Patient Strengths: Forensic psychologist fund  of knowledge   Treatment Modalities: Medication Management, Group therapy, Case management,  1 to 1 session with clinician, Psychoeducation, Recreational therapy.   Physician Treatment Plan for Primary Diagnosis: Schizophreniform disorder (HCC) Long Term Goal(s): Improvement in symptoms so as ready for discharge   Short Term Goals: Ability to identify changes in lifestyle to reduce recurrence of condition will improve Ability to verbalize feelings will improve Ability to disclose and discuss suicidal ideas Ability to demonstrate self-control will improve Ability to identify and develop effective coping behaviors will improve Ability to maintain clinical measurements within normal limits will improve Compliance with prescribed medications will improve Ability to identify triggers associated with substance abuse/mental health issues will improve  Medication Management: Evaluate patient's response, side effects, and tolerance of medication regimen.  Therapeutic Interventions: 1 to 1 sessions, Unit Group sessions and Medication administration.  Evaluation of Outcomes: Not Progressing  Physician Treatment Plan for Secondary Diagnosis: Principal Problem:   Schizophreniform disorder (HCC)  Long Term Goal(s): Improvement in symptoms so as ready for discharge   Short Term Goals: Ability to identify changes in lifestyle to reduce recurrence of condition will improve Ability to verbalize feelings will improve Ability to disclose and discuss suicidal  ideas Ability to demonstrate self-control will improve Ability to identify and develop effective coping behaviors will improve Ability to maintain clinical measurements within normal limits will improve Compliance with prescribed medications will improve Ability to identify triggers associated with substance abuse/mental health issues will improve     Medication Management: Evaluate patient's response, side effects, and tolerance of medication  regimen.  Therapeutic Interventions: 1 to 1 sessions, Unit Group sessions and Medication administration.  Evaluation of Outcomes: Not Progressing   RN Treatment Plan for Primary Diagnosis: Schizophreniform disorder (HCC) Long Term Goal(s): Knowledge of disease and therapeutic regimen to maintain health will improve  Short Term Goals: Ability to remain free from injury will improve, Ability to verbalize frustration and anger appropriately will improve, Ability to demonstrate self-control, Ability to participate in decision making will improve, Ability to verbalize feelings will improve, Ability to disclose and discuss suicidal ideas, Ability to identify and develop effective coping behaviors will improve, and Compliance with prescribed medications will improve  Medication Management: RN will administer medications as ordered by provider, will assess and evaluate patient's response and provide education to patient for prescribed medication. RN will report any adverse and/or side effects to prescribing provider.  Therapeutic Interventions: 1 on 1 counseling sessions, Psychoeducation, Medication administration, Evaluate responses to treatment, Monitor vital signs and CBGs as ordered, Perform/monitor CIWA, COWS, AIMS and Fall Risk screenings as ordered, Perform wound care treatments as ordered.  Evaluation of Outcomes: Not Progressing   LCSW Treatment Plan for Primary Diagnosis: Schizophreniform disorder [facility]) Long Term Goal(s): Safe transition to appropriate next level of care at discharge, Engage patient in therapeutic group addressing interpersonal concerns.  Short Term Goals: Engage patient in aftercare planning with referrals and resources, Increase social support, Increase ability to appropriately verbalize feelings, Increase emotional regulation, Facilitate acceptance of mental health diagnosis and concerns, Facilitate patient progression through stages of change regarding substance use  diagnoses and concerns, Identify triggers associated with mental health/substance abuse issues, and Increase skills for wellness and recovery  Therapeutic Interventions: Assess for all discharge needs, 1 to 1 time with Social worker, Explore available resources and support systems, Assess for adequacy in community support network, Educate family and significant other(s) on suicide prevention, Complete Psychosocial Assessment, Interpersonal group therapy.  Evaluation of Outcomes: Not Progressing   Progress in Treatment: Attending groups: No. Participating in groups: No. Taking medication as prescribed: Not currently scheduled  Toleration medication: Not currently scheduled Family/Significant other contact made: No, will contact:  consents pending Patient understands diagnosis: No. Discussing patient identified problems/goals with staff: Yes. Medical problems stabilized or resolved: Yes. Denies suicidal/homicidal ideation: Yes. Issues/concerns per patient self-inventory: No.  New problem(s) identified: No, Describe:  none  New Short Term/Long Term Goal(s): medication stabilization, elimination of SI thoughts, development of comprehensive mental wellness plan.    Patient Goals:  "Get on better medicine"  Discharge Plan or Barriers: Patient recently admitted. CSW will continue to follow and assess for appropriate referrals and possible discharge planning.    Reason for Continuation of Hospitalization: Medication stabilization Suicidal ideation  Estimated Length of Stay: 5-7 days  Last 3 [place] Suicide Severity Risk Score: Flowsheet Row [facility] (Current) [facility] from 12/26/2022 in [facility] at [facility] ED from [facility]/29/2019 in [facility]  C-SSRS RISK CATEGORY No Risk No Risk No Risk       Last PHQ 2/9 Scores:     No data to display  Scribe for Treatment Team: Kathi Der, Theresia Majors 12/30/2022 12:30 PM

## 2022-12-31 DIAGNOSIS — F2081 Schizophreniform disorder: Secondary | ICD-10-CM | POA: Diagnosis not present

## 2022-12-31 NOTE — Plan of Care (Signed)

## 2022-12-31 NOTE — Progress Notes (Signed)
   12/31/22 1200  Psych Admission Type (Psych Patients Only)  Admission Status Voluntary  Psychosocial Assessment  Patient Complaints Suspiciousness  Eye Contact Brief  Facial Expression Flat  Affect Blunted;Depressed  Speech Pressured  Interaction Guarded  Motor Activity Restless  Appearance/Hygiene In scrubs  Behavior Characteristics Guarded  Mood Suspicious;Preoccupied  Aggressive Behavior  Effect No apparent injury  Thought Process  Coherency WDL  Content Paranoia  Delusions Paranoid  Perception Hallucinations  Hallucination Auditory  Judgment Limited  Confusion Mild  Danger to Self  Current suicidal ideation? Denies  Agreement Not to Harm Self Yes  Description of Agreement verbal  Danger to Others  Danger to Others None reported or observed

## 2022-12-31 NOTE — Plan of Care (Signed)
  Problem: Activity: Goal: Interest or engagement in activities will improve Outcome: Progressing   Problem: Coping: Goal: Ability to verbalize frustrations and anger appropriately will improve Outcome: Progressing   Problem: Health Behavior/Discharge Planning: Goal: Identification of resources available to assist in meeting health care needs will improve Outcome: Progressing   Problem: Safety: Goal: Periods of time without injury will increase Outcome: Progressing

## 2022-12-31 NOTE — Progress Notes (Signed)
[facility] MD Progress Note  12/31/2022 4:29 PM [facility]  MRN:  [id number]  Principal Problem: Schizophreniform disorder [facility]) Diagnosis: Principal Problem:   Schizophreniform disorder (HCC)  Reason for admission: [facility] is a 37yo M with a history of psychosis, who was admitted to [facility] due to paranoia, not feeling safe from harming self or others   Today's assessment note: Patient is seen and examined on the unit on [address].  Able to participate effectively in the assessment today by answering some questions appropriately.  He reports his mood is less depressed with congruent affect.  He rates depression as #5/10, with 10 being high severity. Patient is alert and oriented to name, place, and partly situation.  Chart reviewed and findings shared with the treatment team and consult with attending psychiatrist.  Patient asked this provider about when he will be going home, made patient aware that we will discuss discharge tomorrow after the interdisciplinary morning meeting.  Patient then added that he was incarcerated for 1 year due to getting involved with the wrong people, and was recently discharged from prison in November 2024.  Added, "I do not plan to go back to prison for nobody.  I want to be discharged so that I can find some type of job."  No behavioral issues reported per nursing staff.  Patient compliant with his medications without reported adverse side effects.  No EPS observed, will continue to monitor metabolic profile as patient is on atypical antipsychotic.  Was made aware by the nursing staff that patient signed a 72 hours for discharge today at 1439.  Will discuss this with the LCSW in a.m. to start appropriate safety planning for patient discharge.  Reports that anxiety is manageable level and rates anxiety as #5/10, with 10 being high severity Nursing staff report patient sleeping over 8.25 hours last night Appetite is good Concentration is improving Energy level is  adequate Patient denies SI, HI, or AVH Denies having psychotic symptoms. Denies having side effects to current psychiatric medications.   We discussed compliance to current medication regimen.   Labs: no new results for review.  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 Previous psychiatric hospitalizations: [facility] 2021 Previous outpatient psychiatrist: unknown Therapist/Counselor: none History of prior suicide attempts: denies Non-suicidal self-injurious behaviors: denies History of violence: denies Previous psych medication: Risperidone Current psych medications: none  Past Medical History:  Past Medical History:  Diagnosis Date   Schizophrenia (HCC)     Past Surgical History:  Procedure Laterality Date   NO PAST SURGERIES     Family History: History reviewed. No pertinent family history. Family Psychiatric  History:  Schizophrenia in two brothers. Patient denies suicide in family members.  Social History:  Social History   Substance and Sexual Activity  Alcohol Use Not Currently   Comment: ive been in prison for a year i haven't used any     Social History   Substance and Sexual Activity  Drug Use Not Currently    Social History   Socioeconomic History   Marital status: Single    Spouse name: Not on file   Number of children: Not on file   Years of education: Not on file   Highest education level: Not on file  Occupational History   Not on file  Tobacco Use   Smoking status: Former    Current packs/day: 0.00    Types: Cigarettes    Quit date:  2019    Years since quitting: 5.9   Smokeless tobacco: Never  Vaping Use   Vaping status: Never Used  Substance and Sexual Activity   Alcohol use: Not Currently    Comment: ive been in prison for a year i haven't used any   Drug use: Not Currently   Sexual activity: Not Currently    Birth  control/protection: Condom, Abstinence  Other Topics Concern   Not on file  Social History Narrative   Not on file   Social Determinants of Health   Financial Resource Strain: Not on file  Food Insecurity: No Food Insecurity (12/27/2022)   Hunger Vital Sign    Worried About Running Out of Food in the Last Year: Never true    Ran Out of Food in the Last Year: Never true  Transportation Needs: No Transportation Needs (12/27/2022)   PRAPARE - Administrator, Civil Service (Medical): No    Lack of Transportation (Non-Medical): No  Physical Activity: Not on file  Stress: Not on file  Social Connections: Not on file   Additional Social History:    Sleep: Good  Appetite:  Good  Current Medications: Current Facility-Administered Medications  Medication Dose Route Frequency Provider Last Rate Last Admin   acetaminophen (TYLENOL) tablet 650 mg  650 mg Oral Q6H PRN Charm Rings, NP       acetaminophen (TYLENOL) tablet 650 mg  650 mg Oral Q6H PRN Charm Rings, NP       alum & mag hydroxide-simeth (MAALOX/MYLANTA) 200-200-20 MG/5ML suspension 30 mL  30 mL Oral Q4H PRN Charm Rings, NP       alum & mag hydroxide-simeth (MAALOX/MYLANTA) 200-200-20 MG/5ML suspension 30 mL  30 mL Oral Q4H PRN Charm Rings, NP       diphenhydrAMINE (BENADRYL) capsule 50 mg  50 mg Oral BID PRN Charm Rings, NP       Or   diphenhydrAMINE (BENADRYL) injection 50 mg  50 mg Intramuscular BID PRN Charm Rings, NP       feeding supplement (ENSURE ENLIVE / ENSURE PLUS) liquid 237 mL  237 mL Oral BID BM Attiah, Nadir, MD   237 mL at 12/31/22 1540   magnesium hydroxide (MILK OF MAGNESIA) suspension 30 mL  30 mL Oral Daily PRN Charm Rings, NP       magnesium hydroxide (MILK OF MAGNESIA) suspension 30 mL  30 mL Oral Daily PRN Charm Rings, NP       OLANZapine (ZYPREXA) tablet 10 mg  10 mg Oral BID PRN Charm Rings, NP       Or   OLANZapine (ZYPREXA) injection 10 mg  10 mg  Intramuscular BID PRN Charm Rings, NP       OLANZapine (ZYPREXA) tablet 10 mg  10 mg Oral QHS [name], MD   10 mg at 12/30/22 2023   traZODone (DESYREL) tablet 100 mg  100 mg Oral QHS PRN Charm Rings, NP   100 mg at 12/30/22 2023   Lab Results:  No results found for this or any previous visit (from the past 48 hour(s)).  Blood Alcohol level:  Lab Results  Component Value Date   ETH <10 12/26/2022   ETH <10 05/25/2019   Metabolic Disorder Labs: No results found for: "HGBA1C", "MPG" No results found for: "PROLACTIN" No results found for: "CHOL", "TRIG", "HDL", "CHOLHDL", "VLDL", "LDLCALC"  Physical Findings: AIMS:  , ,  ,  ,  CIWA:    COWS:     Musculoskeletal: Strength & Muscle Tone: within normal limits Gait & Station: normal Patient leans: N/A  Psychiatric Specialty Exam:  Appearance:  AAM, appearing stated age,  wearing appropriate to the situation casual/hospital clothes, with fair grooming and hygiene. Normal level of alertness and appropriate facial expression.   Attitude/Behavior: calm, cooperative, engaging with appropriate eye contact.   Motor: WNL; dyskinesias not evident. Gait appears in full range.   Speech: spontaneous, clear, coherent, normal comprehension.   Mood: "better"   Affect: restricted, blunted.   Thought process: patient appears coherent, goal-directed, linear with questions; some thought blocking observed.   Thought content: patient denies suicidal thoughts, denies homicidal thoughts; expresses paranoid delusions.   Thought perception: patient reports racing thoughts with some improvement today; patient denies auditory and visual hallucinations. Did not appear internally stimulated.   Cognition: patient is alert and oriented in self, place, date.   Insight: limited, in regards of understanding of presence, nature, cause, and significance of mental or emotional problem.   Judgement: questionable, in regards of ability to make  good decisions concerning the appropriate thing to do in various situations, including ability to form opinions regarding their mental health condition.   Physical Exam: Physical Exam Vitals and nursing note reviewed.  Constitutional:      General: He is not in acute distress.    Appearance: He is not toxic-appearing.  HENT:     Head: Normocephalic.     Nose: Nose normal.     Mouth/Throat:     Mouth: Mucous membranes are moist.  Eyes:     Extraocular Movements: Extraocular movements intact.  Cardiovascular:     Pulses: Normal pulses.  Pulmonary:     Effort: Pulmonary effort is normal.  Abdominal:     Comments: Deferred  Genitourinary:    Comments: Deferred Musculoskeletal:        General: Normal range of motion.     Cervical back: Normal range of motion.  Skin:    General: Skin is warm.  Neurological:     General: No focal deficit present.     Mental Status: He is alert and oriented to person, place, and time.  Psychiatric:        Behavior: Behavior normal.    Review of Systems  Constitutional:  Negative for chills and fever.  HENT:  Negative for sore throat.   Eyes:  Negative for blurred vision.  Respiratory:  Negative for cough, sputum production, shortness of breath and wheezing.   Cardiovascular:  Negative for chest pain and palpitations.  Gastrointestinal:  Negative for abdominal pain, constipation, diarrhea, heartburn, nausea and vomiting.  Genitourinary:  Negative for dysuria, frequency and urgency.  Musculoskeletal: Negative.   Skin:  Negative for itching and rash.  Neurological:  Negative for dizziness, tingling, tremors, sensory change and headaches.  Endo/Heme/Allergies:        See allergy listing  Psychiatric/Behavioral:  Positive for depression. Negative for hallucinations, substance abuse and suicidal ideas. The patient is nervous/anxious. The patient does not have insomnia.    Blood pressure 110/77, pulse 87, temperature 97.8 F (36.6 C), temperature  source Oral, resp. rate 18, height 5\' 6"  (1.676 m), weight 68 kg, SpO2 100%. Body mass index is 24.21 kg/m.  Treatment Plan Summary: Daily contact with patient to assess and evaluate symptoms and progress in treatment and Medication management  36yo M with a history of psychosis, who was admitted to [facility] due to paranoia, not feeling safe from harming  self or others. Patient reports paranoid delusions and intrusive thoughts that he can hurt self of others without active suicidal or homicidal ideations. Patient denies recent drug use. 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  Patient continues to report partial improvement of paranoia and sleep with Zyprexa; the dose will be adjusted higher tonight for remaining psychosis.   Diagnoses/ Active problems: -Schizophrenia.  PLAN:  Safety and Monitoring: continue inpatient psych admission; 15-minute checks; daily contact with patient to assess and evaluate symptoms and progress in treatment; psychoeducation.Vital signs: q12 hours. Precautions: suicide, elopement, and assault. Placed on room lock out for meals, snacks and groups.  Psychiatric Problems: -Continue olanzapine 10 mg PO at bedtime for psychosis.  - Metabolic profile and EKG monitoring obtained while on an atypical antipsychotic .  - Encouraged patient to participate in unit milieu and in scheduled group therapies   Medical Problems: none  PRN medications:  acetaminophen, acetaminophen, alum & mag hydroxide-simeth, alum & mag hydroxide-simeth, diphenhydrAMINE **OR** diphenhydrAMINE, magnesium hydroxide, magnesium hydroxide, OLANZapine **OR** OLANZapine, traZODone  Pertinent Labs: no new labs ordered today  Consults: No new consults placed since yesterday    Discharge Planning: -Social work and case management to assist with discharge planning and identification of hospital follow-up needs prior to discharge -Estimated LOS: 5 days -Discharge Concerns: Need to  establish a safety plan; Medication compliance and effectiveness -Discharge Goals: Return home with outpatient referrals for mental health follow-up including medication management/psychotherapy  Cecilie Lowers, FNP 12/31/2022, 4:29 PM Patient ID: Kohei Boortz, male   DOB: 12-02-1985, 37 y.o.   MRN: [id number] Patient ID: Vonn Buol, male   DOB: [birth year], 37 y.o.   MRN: [id number]

## 2022-12-31 NOTE — Progress Notes (Signed)
Pt signed 72 hour request for discharge 12/31/22 at 1439.  Provider made aware.

## 2022-12-31 NOTE — Group Note (Signed)
Recreation Therapy Group Note   Group Topic:Health and Wellness  Group Date: 12/31/2022 Start Time: 1045 End Time: 1110 Facilitators: Hailly Fess-McCall, LRT,CTRS Location: [address] Dayroom   Group Topic: Wellness  Goal Area(s) Addresses:  Patient will define components of whole wellness. Patient will verbalize benefit of whole wellness.  Intervention: Music, Calm App  Group Description: LRT and patients discussed the importance of wellness and how it affects the body. Patients were to lead the group in the exercises of their choosing. The exercises could be as strenuous or easy as the patient chooses. LRT then led the group in a meditation that focused on being resilient in face of adversity.    Education: Wellness, Building control surveyor.   Education Outcome: Acknowledges education/In group clarification offered/Needs additional education.    Affect/Mood: N/A   Participation Level: Did not attend    Clinical Observations/Individualized Feedback:     Plan: Continue to engage patient in RT group sessions 2-3x/week.   Namon Villarin-McCall, LRT,CTRS 12/31/2022 1:36 PM

## 2022-12-31 NOTE — [facility] Group Notes (Signed)
[facility] Group Notes:  (Nursing/MHT/Case Management/Adjunct)  Date:  12/31/2022  Time:  4:20 PM    Group Topic/Focus:  Goals Group:   The focus of this group is to help patients establish daily goals to achieve during treatment and discuss how the patient can incorporate goal setting into their daily lives to aide in recovery.  Participation Level:  Did Not Attend    E[facility] 12/31/2022, 4:20 PM

## 2023-01-01 DIAGNOSIS — F2081 Schizophreniform disorder: Secondary | ICD-10-CM | POA: Diagnosis not present

## 2023-01-01 MED ORDER — OLANZAPINE 7.5 MG PO TABS
15.0000 mg | ORAL_TABLET | Freq: Every day | ORAL | Status: DC
  Administered 2023-01-01 – 2023-01-03 (×3): 15 mg via ORAL
  Filled 2023-01-01 (×2): qty 2
  Filled 2023-01-01: qty 6
  Filled 2023-01-01 (×2): qty 2

## 2023-01-01 NOTE — Group Note (Signed)
Date:  01/01/2023 Time:  9:08 AM  Group Topic/Focus:  Goals Group:   The focus of this group is to help patients establish daily goals to achieve during treatment and discuss how the patient can incorporate goal setting into their daily lives to aide in recovery.    Participation Level:  Did Not Attend   Additional Comments:  Pt was aware that group was beginning but chose not to attend  Donell Beers 01/01/2023, 9:08 AM

## 2023-01-01 NOTE — Progress Notes (Signed)
[facility] MD Progress Note  01/01/2023 4:59 PM Ethan Sutton  MRN:  [id number]  Principal Problem: Schizophreniform disorder [facility]) Diagnosis: Principal Problem:   Schizophreniform disorder (HCC)  Reason for admission: Mr. Jarosz is a 37yo M with a history of psychosis, who was admitted to [facility] due to paranoia, not feeling safe from harming self or others   Today's assessment note: On assessment today, patient is able to participate minimally with the assessment questions, constantly requesting to be discharged.  He reports his mood is less depressed, however rates depression as #6/10, with 10 being high severity. Patient is alert and oriented to name, place, and partly situation.  Chart reviewed and findings shared with the treatment team and consult with attending psychiatrist.  Patient reports that he came in voluntarily and he can leave any time that he wants, appears disorganized today and needing redirection and pulling his pants up while on the hallway.  Appeared to be responding to some internal stimuli, Zyprexa increased from 10 mg to 15 mg p.o. nightly for psychosis.  Made patient aware that he would be discharged when his symptoms are stable and at baseline.  He is currently compliant with his medications without reported adverse side effects.  No EPS observed, will continue to monitor metabolic profile as patient is on atypical antipsychotic.  His 72 hours for discharge signed at 1439 yesterday continues to be in effect.  Initially patient reports that he was going home to his mother, then refused to provide consent to follow up for collateral information. We will discuss appropriate safety planning for patient's discharge and call collateral tomorrow with patient permission, to inquire if the patient is at baseline (to prevent harm to self or others) and stable for discharge.  Reports that anxiety as #5/10, with 10 being high severity Nursing staff report patient sleeping for only 5 hours last  night Appetite is good Concentration is reduced today Energy level is adequate Patient denies SI, HI, or AVH, however appears to be responding to internal stimuli      Denies having psychotic symptoms. Denies having side effects to current psychiatric medications.   We discussed compliance to current medication regimen.   Labs: no new results for review.  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 Previous psychiatric hospitalizations: [facility] 2021 Previous outpatient psychiatrist: unknown Therapist/Counselor: none History of prior suicide attempts: denies Non-suicidal self-injurious behaviors: denies History of violence: denies Previous psych medication: Risperidone Current psych medications: none  Past Medical History:  Past Medical History:  Diagnosis Date   Schizophrenia (HCC)     Past Surgical History:  Procedure Laterality Date   NO PAST SURGERIES     Family History: History reviewed. No pertinent family history. Family Psychiatric  History:  Schizophrenia in two brothers. Patient denies suicide in family members.  Social History:  Social History   Substance and Sexual Activity  Alcohol Use Not Currently   Comment: ive been in prison for a year i haven't used any     Social History   Substance and Sexual Activity  Drug Use Not Currently    Social History   Socioeconomic History   Marital status: Single    Spouse name: Not on file   Number of children: Not on file   Years of education: Not on file   Highest education level: Not on file  Occupational History   Not on file  Tobacco Use   Smoking status:  Former    Current packs/day: 0.00    Types: Cigarettes    Quit date: 2019    Years since quitting: 5.9   Smokeless tobacco: Never  Vaping Use   Vaping status: Never Used  Substance and Sexual Activity   Alcohol use: Not Currently    Comment: ive  been in prison for a year i haven't used any   Drug use: Not Currently   Sexual activity: Not Currently    Birth control/protection: Condom, Abstinence  Other Topics Concern   Not on file  Social History Narrative   Not on file   Social Determinants of Health   Financial Resource Strain: Not on file  Food Insecurity: No Food Insecurity (12/27/2022)   Hunger Vital Sign    Worried About Running Out of Food in the Last Year: Never true    Ran Out of Food in the Last Year: Never true  Transportation Needs: No Transportation Needs (12/27/2022)   PRAPARE - Administrator, Civil Service (Medical): No    Lack of Transportation (Non-Medical): No  Physical Activity: Not on file  Stress: Not on file  Social Connections: Not on file   Additional Social History:    Sleep: Good  Appetite:  Good  Current Medications: Current Facility-Administered Medications  Medication Dose Route Frequency Provider Last Rate Last Admin   acetaminophen (TYLENOL) tablet 650 mg  650 mg Oral Q6H PRN Charm Rings, NP       acetaminophen (TYLENOL) tablet 650 mg  650 mg Oral Q6H PRN Charm Rings, NP       alum & mag hydroxide-simeth (MAALOX/MYLANTA) 200-200-20 MG/5ML suspension 30 mL  30 mL Oral Q4H PRN Charm Rings, NP       alum & mag hydroxide-simeth (MAALOX/MYLANTA) 200-200-20 MG/5ML suspension 30 mL  30 mL Oral Q4H PRN Charm Rings, NP       diphenhydrAMINE (BENADRYL) capsule 50 mg  50 mg Oral BID PRN Charm Rings, NP       Or   diphenhydrAMINE (BENADRYL) injection 50 mg  50 mg Intramuscular BID PRN Charm Rings, NP       feeding supplement (ENSURE ENLIVE / ENSURE PLUS) liquid 237 mL  237 mL Oral BID BM Attiah, Nadir, MD   237 mL at 01/01/23 1626   magnesium hydroxide (MILK OF MAGNESIA) suspension 30 mL  30 mL Oral Daily PRN Charm Rings, NP       magnesium hydroxide (MILK OF MAGNESIA) suspension 30 mL  30 mL Oral Daily PRN Charm Rings, NP       OLANZapine (ZYPREXA)  tablet 10 mg  10 mg Oral BID PRN Charm Rings, NP       Or   OLANZapine (ZYPREXA) injection 10 mg  10 mg Intramuscular BID PRN Charm Rings, NP       OLANZapine (ZYPREXA) tablet 10 mg  10 mg Oral QHS [name], MD   10 mg at 12/31/22 2033   traZODone (DESYREL) tablet 100 mg  100 mg Oral QHS PRN Charm Rings, NP   100 mg at 12/31/22 2034   Lab Results:  No results found for this or any previous visit (from the past 48 hour(s)).  Blood Alcohol level:  Lab Results  Component Value Date   ETH <10 12/26/2022   ETH <10 05/25/2019   Metabolic Disorder Labs: No results found for: "HGBA1C", "MPG" No results found for: "PROLACTIN" No results found for: "CHOL", "  TRIG", "HDL", "CHOLHDL", "VLDL", "LDLCALC"  Physical Findings: AIMS:  , ,  ,  ,    CIWA:    COWS:     Musculoskeletal: Strength & Muscle Tone: within normal limits Gait & Station: normal Patient leans: N/A  Psychiatric Specialty Exam:  Appearance:  AAM, appearing stated age,  wearing appropriate to the situation casual/hospital clothes, with fair grooming and hygiene. Normal level of alertness and appropriate facial expression.   Attitude/Behavior: calm, cooperative, engaging with appropriate eye contact.   Motor: WNL; dyskinesias not evident. Gait appears in full range.   Speech: spontaneous, clear, coherent, normal comprehension.   Mood: "better"   Affect: restricted, blunted.   Thought process: patient appears coherent, goal-directed, linear with questions; some thought blocking observed.   Thought content: patient denies suicidal thoughts, denies homicidal thoughts; expresses paranoid delusions.   Thought perception: patient reports racing thoughts with some improvement today; patient denies auditory and visual hallucinations. Did not appear internally stimulated.   Cognition: patient is alert and oriented in self, place, date.   Insight: limited, in regards of understanding of presence, nature, cause,  and significance of mental or emotional problem.   Judgement: questionable, in regards of ability to make good decisions concerning the appropriate thing to do in various situations, including ability to form opinions regarding their mental health condition.   Physical Exam: Physical Exam Vitals and nursing note reviewed.  Constitutional:      General: He is not in acute distress.    Appearance: He is not toxic-appearing.  HENT:     Head: Normocephalic.     Nose: Nose normal.     Mouth/Throat:     Mouth: Mucous membranes are moist.  Eyes:     Extraocular Movements: Extraocular movements intact.  Cardiovascular:     Rate and Rhythm: Normal rate.     Pulses: Normal pulses.  Pulmonary:     Effort: Pulmonary effort is normal.  Abdominal:     Comments: Deferred  Genitourinary:    Comments: Deferred Musculoskeletal:        General: Normal range of motion.     Cervical back: Normal range of motion.  Skin:    General: Skin is warm.  Neurological:     General: No focal deficit present.     Mental Status: He is alert and oriented to person, place, and time.  Psychiatric:     Comments: Restless and disorganized    Review of Systems  Constitutional:  Negative for chills and fever.  HENT:  Negative for sore throat.   Eyes:  Negative for blurred vision.  Respiratory:  Negative for cough, sputum production, shortness of breath and wheezing.   Cardiovascular:  Negative for chest pain and palpitations.  Gastrointestinal:  Negative for abdominal pain, constipation, diarrhea, heartburn, nausea and vomiting.  Genitourinary:  Negative for dysuria, frequency and urgency.  Musculoskeletal: Negative.   Skin:  Negative for itching and rash.  Neurological:  Negative for dizziness, tingling, tremors, sensory change and headaches.  Endo/Heme/Allergies:        See allergy listing  Psychiatric/Behavioral:  Positive for depression. Negative for hallucinations, substance abuse and suicidal ideas.  The patient is nervous/anxious. The patient does not have insomnia.    Blood pressure 112/78, pulse 85, temperature 97.8 F (36.6 C), temperature source Oral, resp. rate 18, height 5\' 6"  (1.676 m), weight 68 kg, SpO2 100%. Body mass index is 24.21 kg/m.  Treatment Plan Summary: Daily contact with patient to assess and evaluate symptoms and progress  in treatment and Medication management  37yo M with a history of psychosis, who was admitted to [facility] due to paranoia, not feeling safe from harming self or others. Patient reports paranoid delusions and intrusive thoughts that he can hurt self of others without active suicidal or homicidal ideations. Patient denies recent drug use. 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  Patient continues to report partial improvement of paranoia and sleep with Zyprexa; the dose will be adjusted higher tonight for remaining psychosis.   Diagnoses/ Active problems: -Schizophrenia.  PLAN:  Safety and Monitoring: continue inpatient psych admission; 15-minute checks; daily contact with patient to assess and evaluate symptoms and progress in treatment; psychoeducation.Vital signs: q12 hours. Precautions: suicide, elopement, and assault. Placed on room lock out for meals, snacks and groups.  Psychiatric Problems: -Increase Olanzapine from 10 mg to 15 mg PO at bedtime for psychosis.  - Metabolic profile and EKG monitoring obtained while on an atypical antipsychotic .  - Encouraged patient to participate in unit milieu and in scheduled group therapies   Medical Problems: none  PRN medications:  acetaminophen, acetaminophen, alum & mag hydroxide-simeth, alum & mag hydroxide-simeth, diphenhydrAMINE **OR** diphenhydrAMINE, magnesium hydroxide, magnesium hydroxide, OLANZapine **OR** OLANZapine, traZODone  Pertinent Labs: no new labs ordered today  Consults: No new consults placed since yesterday    Discharge Planning: -Social work and case  management to assist with discharge planning and identification of hospital follow-up needs prior to discharge -Estimated LOS: 5 days -Discharge Concerns: Need to establish a safety plan; Medication compliance and effectiveness -Discharge Goals: Return home with outpatient referrals for mental health follow-up including medication management/psychotherapy  Cecilie Lowers, FNP 01/01/2023, 4:59 PM Patient ID: Tagan Roughton, male   DOB: 06-06-1985, 37 y.o.   MRN: [id number] Patient ID: Lennin Estella, male   DOB: 05/06/1985, 37 y.o.   MRN: [id number] Patient ID: Harding Mcgourty, male   DOB: Aug 28, 1985, 37 y.o.   MRN: [id number]

## 2023-01-01 NOTE — Plan of Care (Signed)
  Problem: Education: Goal: Knowledge of [facility] Education information/materials will improve Outcome: Progressing   Problem: Activity: Goal: Interest or engagement in activities will improve Outcome: Progressing   Problem: Safety: Goal: Periods of time without injury will increase Outcome: Progressing   

## 2023-01-01 NOTE — Group Note (Signed)
Recreation Therapy Group Note   Group Topic:Team Building  Group Date: 01/01/2023 Start Time: 1005 End Time: 1045 Facilitators: Kumar Falwell-McCall, LRT,CTRS Location: [address] Dayroom   Group Topic: Communication, Team Building, Problem Solving  Goal Area(s) Addresses:  Patient will effectively work with peer towards shared goal.  Patient will identify skills used to make activity successful.  Patient will share challenges and verbalize solution-driven approaches used. Patient will identify how skills used during activity can be used to reach post d/c goals.   Intervention: STEM Activity   Group Description: [employer]. Patients were provided the following materials: 4 drinking straws, 5 rubber bands, 5 paper clips, 2 index cards and 2 drinking cups. Using the provided materials patients were asked to build a launching mechanism to launch a ping pong ball across the room, approximately 10 feet. Patients were divided into teams of 3-5. Instructions required all materials be incorporated into the device, functionality of items left to the peer group's discretion. LRT also allowed patients to select songs to listen to as they worked on [employer].  Education: Pharmacist, community, Scientist, physiological, Air cabin crew, Building control surveyor.   Education Outcome: Acknowledges education/In group clarification offered/Needs additional education.    Affect/Mood: N/A   Participation Level: Did not attend    Clinical Observations/Individualized Feedback:     Plan: Continue to engage patient in RT group sessions 2-3x/week.   Judieth Mckown-McCall, LRT,CTRS  01/01/2023 1:11 PM

## 2023-01-01 NOTE — Progress Notes (Signed)
   01/01/23 0800  Psych Admission Type (Psych Patients Only)  Admission Status Voluntary  Psychosocial Assessment  Patient Complaints Suspiciousness  Eye Contact Brief  Facial Expression Flat  Affect Blunted;Depressed  Speech Logical/coherent  Interaction Cautious  Motor Activity Restless  Appearance/Hygiene In scrubs  Behavior Characteristics Guarded  Mood Suspicious  Thought Process  Coherency WDL  Content Phobias  Delusions Paranoid  Perception Hallucinations  Hallucination Auditory  Judgment Limited  Confusion Mild  Danger to Self  Current suicidal ideation? Denies  Agreement Not to Harm Self Yes  Description of Agreement verbal  Danger to Others  Danger to Others None reported or observed

## 2023-01-01 NOTE — Plan of Care (Signed)
  Problem: Education: Goal: Knowledge of [place] [facility] Education information/materials will improve Outcome: Progressing Goal: Emotional status will improve Outcome: Progressing Goal: Mental status will improve Outcome: Progressing   

## 2023-01-01 NOTE — [facility] Group Notes (Signed)
The focus of this group is to help patients review their daily goal of treatment and discuss progress on daily workbooks. Pt was attentive and present during tonight's wrap up group discussion. Pt was able to share that today was an good day. He has been supportive to peers and is currently getting ready for discharge.

## 2023-01-02 DIAGNOSIS — F2081 Schizophreniform disorder: Secondary | ICD-10-CM | POA: Diagnosis not present

## 2023-01-02 NOTE — Progress Notes (Signed)
Patient withdrew 72 hour request for discharge 01/02/23 at 1700.

## 2023-01-02 NOTE — Progress Notes (Signed)
   01/01/23 2000  Psych Admission Type (Psych Patients Only)  Admission Status Voluntary  Psychosocial Assessment  Patient Complaints Suspiciousness  Eye Contact Fair  Facial Expression Flat  Affect Depressed  Speech Logical/coherent  Interaction Cautious  Motor Activity Restless  Appearance/Hygiene In scrubs  Behavior Characteristics Guarded  Mood Suspicious  Thought Process  Coherency WDL  Content Paranoia  Delusions Paranoid  Perception Hallucinations  Hallucination Auditory  Judgment Limited  Confusion Mild  Danger to Self  Current suicidal ideation? Denies;Verbalizes  Agreement Not to Harm Self Yes  Description of Agreement verbal  Danger to Others  Danger to Others None reported or observed

## 2023-01-02 NOTE — Plan of Care (Signed)
  Problem: Education: Goal: Emotional status will improve Outcome: Progressing Goal: Mental status will improve Outcome: Progressing Goal: Verbalization of understanding the information provided will improve Outcome: Progressing   Problem: Activity: Goal: Interest or engagement in activities will improve Outcome: Progressing   

## 2023-01-02 NOTE — Progress Notes (Addendum)
[facility] MD Progress Note  01/02/2023 4:16 PM Ethan Sutton  MRN:  [id number]  Principal Problem: Schizophreniform disorder [facility]) Diagnosis: Principal Problem:   Schizophreniform disorder (HCC)  Reason for admission: Ethan Sutton is a 37yo M with a history of psychosis, who was admitted to [facility] due to paranoia, not feeling safe from harming self or others   Today's assessment note: Patient is seen and examined on the unit sitting up on a chair.  He is alert, calmer, oriented to person, place, time and partly situation. He reports his mood is less depressed, and rates depression as #0/10, with 10 being high severity.  Chart reviewed and findings shared with the treatment team and consult with attending psychiatrist.  Nursing staff report no behavioral issues or psychosis.  He continues on Zyprexa 15 mg p.o. nightly for psychosis. He is currently compliant with his medications without reported adverse side effects.  No EPS observed, will continue to monitor metabolic profile as patient is on atypical antipsychotic.  His 72 hours for discharge signed at 1439 on Tuesday, 12/31/2022 continues to be in effect.  Initially patient reports that he was going home to his mother, then refused to provide consent to follow up for collateral information.  Pt  provided consent today to talk with mom Ethan Sutton at [phone].  We will discuss appropriate safety planning for patient's discharge, see collateral information below.  Addendum: Patient aware of mother's proposal, and he rescinded the 72-hour for discharge signed on Tuesday, 12/31/2022.  And agreed to be discharged on Saturday to [facility].  Reports that anxiety as #0/10, with 10 being high severity Nursing staff report patient sleeping for only 3.75 hours last night Appetite is good Concentration is improved from yesterday Energy level is adequate Patient denies SI, HI, or AVH, however appears to be responding to internal stimuli could be baseline  for patient.    Denies having psychotic symptoms. Denies having side effects to current psychiatric medications.   We discussed compliance to current medication regimen.  Collateral information: Patient's mother Ethan Sutton called at [phone] for information on patient.  Mother reports that patient lives on her property in a tent.  She reports she would be available this weekend on Saturday to receive patient, however, no family member will be able to pick up patient from the hospital.  Requesting social workers to provide transportation for patient upon discharge as they live in [place].  He added that social worker to give her a call tomorrow for direction to her home.   Labs: no new results for review.  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 Previous psychiatric hospitalizations: [facility] 2021 Previous outpatient psychiatrist: unknown Therapist/Counselor: none History of prior suicide attempts: denies Non-suicidal self-injurious behaviors: denies History of violence: denies Previous psych medication: Risperidone Current psych medications: none  Past Medical History:  Past Medical History:  Diagnosis Date   Schizophrenia (HCC)     Past Surgical History:  Procedure Laterality Date   NO PAST SURGERIES     Family History: History reviewed. No pertinent family history. Family Psychiatric  History:  Schizophrenia in two brothers. Patient denies suicide in family members.  Social History:  Social History   Substance and Sexual Activity  Alcohol Use Not Currently   Comment: ive been in prison for a year i haven't used any     Social History   Substance and Sexual Activity  Drug Use  Not Currently    Social History   Socioeconomic History   Marital status: Single    Spouse name: Not on file   Number of children: Not on file   Years of education: Not on  file   Highest education level: Not on file  Occupational History   Not on file  Tobacco Use   Smoking status: Former    Current packs/day: 0.00    Types: Cigarettes    Quit date: 2019    Years since quitting: 5.9   Smokeless tobacco: Never  Vaping Use   Vaping status: Never Used  Substance and Sexual Activity   Alcohol use: Not Currently    Comment: ive been in prison for a year i haven't used any   Drug use: Not Currently   Sexual activity: Not Currently    Birth control/protection: Condom, Abstinence  Other Topics Concern   Not on file  Social History Narrative   Not on file   Social Determinants of Health   Financial Resource Strain: Not on file  Food Insecurity: No Food Insecurity (12/27/2022)   Hunger Vital Sign    Worried About Running Out of Food in the Last Year: Never true    Ran Out of Food in the Last Year: Never true  Transportation Needs: No Transportation Needs (12/27/2022)   PRAPARE - Administrator, Civil Service (Medical): No    Lack of Transportation (Non-Medical): No  Physical Activity: Not on file  Stress: Not on file  Social Connections: Not on file   Additional Social History:    Sleep: Good  Appetite:  Good  Current Medications: Current Facility-Administered Medications  Medication Dose Route Frequency Provider Last Rate Last Admin   acetaminophen (TYLENOL) tablet 650 mg  650 mg Oral Q6H PRN Charm Rings, NP       acetaminophen (TYLENOL) tablet 650 mg  650 mg Oral Q6H PRN Charm Rings, NP       alum & mag hydroxide-simeth (MAALOX/MYLANTA) 200-200-20 MG/5ML suspension 30 mL  30 mL Oral Q4H PRN Charm Rings, NP       alum & mag hydroxide-simeth (MAALOX/MYLANTA) 200-200-20 MG/5ML suspension 30 mL  30 mL Oral Q4H PRN Charm Rings, NP       diphenhydrAMINE (BENADRYL) capsule 50 mg  50 mg Oral BID PRN Charm Rings, NP       Or   diphenhydrAMINE (BENADRYL) injection 50 mg  50 mg Intramuscular BID PRN Charm Rings, NP        feeding supplement (ENSURE ENLIVE / ENSURE PLUS) liquid 237 mL  237 mL Oral BID BM Attiah, Nadir, MD   237 mL at 01/02/23 1502   magnesium hydroxide (MILK OF MAGNESIA) suspension 30 mL  30 mL Oral Daily PRN Charm Rings, NP       magnesium hydroxide (MILK OF MAGNESIA) suspension 30 mL  30 mL Oral Daily PRN Charm Rings, NP       OLANZapine (ZYPREXA) tablet 10 mg  10 mg Oral BID PRN Charm Rings, NP       Or   OLANZapine (ZYPREXA) injection 10 mg  10 mg Intramuscular BID PRN Charm Rings, NP       OLANZapine (ZYPREXA) tablet 15 mg  15 mg Oral QHS Alan Mulder C, FNP   15 mg at 01/01/23 2041   traZODone (DESYREL) tablet 100 mg  100 mg Oral QHS PRN Charm Rings, NP   100 mg  at 01/01/23 2039   Lab Results:  No results found for this or any previous visit (from the past 48 hour(s)).  Blood Alcohol level:  Lab Results  Component Value Date   ETH <10 12/26/2022   ETH <10 05/25/2019   Metabolic Disorder Labs: No results found for: "HGBA1C", "MPG" No results found for: "PROLACTIN" No results found for: "CHOL", "TRIG", "HDL", "CHOLHDL", "VLDL", "LDLCALC"  Physical Findings: AIMS:  , ,  ,  ,    CIWA:    COWS:     Musculoskeletal: Strength & Muscle Tone: within normal limits Gait & Station: normal Patient leans: N/A  Psychiatric Specialty Exam:  Appearance:  AAM, appearing stated age,  wearing appropriate to the situation casual/hospital clothes, with fair grooming and hygiene. Normal level of alertness and appropriate facial expression.   Attitude/Behavior: calm, cooperative, engaging with appropriate eye contact.   Motor: WNL; dyskinesias not evident. Gait appears in full range.   Speech: spontaneous, clear, coherent, normal comprehension.   Mood: "better"   Affect: restricted, blunted.   Thought process: patient appears coherent, goal-directed, linear with questions; some thought blocking observed.   Thought content: patient denies suicidal thoughts, denies  homicidal thoughts; expresses paranoid delusions.   Thought perception: patient reports racing thoughts with some improvement today; patient denies auditory and visual hallucinations. Did not appear internally stimulated.   Cognition: patient is alert and oriented in self, place, date.   Insight: limited, in regards of understanding of presence, nature, cause, and significance of mental or emotional problem.   Judgement: questionable, in regards of ability to make good decisions concerning the appropriate thing to do in various situations, including ability to form opinions regarding their mental health condition.   Physical Exam: Physical Exam Vitals and nursing note reviewed.  Constitutional:      General: He is not in acute distress.    Appearance: He is not toxic-appearing.  HENT:     Head: Normocephalic.     Nose: Nose normal.     Mouth/Throat:     Mouth: Mucous membranes are moist.  Eyes:     Extraocular Movements: Extraocular movements intact.  Cardiovascular:     Rate and Rhythm: Normal rate.     Pulses: Normal pulses.  Pulmonary:     Effort: Pulmonary effort is normal.  Abdominal:     Comments: Deferred  Genitourinary:    Comments: Deferred Musculoskeletal:        General: Normal range of motion.     Cervical back: Normal range of motion.  Skin:    General: Skin is warm.  Neurological:     General: No focal deficit present.     Mental Status: He is alert and oriented to person, place, and time.  Psychiatric:        Behavior: Behavior normal.     Comments: Restless and disorganized    Review of Systems  Constitutional:  Negative for chills and fever.  HENT:  Negative for sore throat.   Eyes:  Negative for blurred vision.  Respiratory:  Negative for cough, sputum production, shortness of breath and wheezing.   Cardiovascular:  Negative for chest pain and palpitations.  Gastrointestinal:  Negative for abdominal pain, constipation, diarrhea, heartburn, nausea  and vomiting.  Genitourinary:  Negative for dysuria, frequency and urgency.  Musculoskeletal: Negative.   Skin:  Negative for itching and rash.  Neurological:  Negative for dizziness, tingling, tremors, sensory change and headaches.  Endo/Heme/Allergies:        See allergy listing  Psychiatric/Behavioral:  Positive for depression. Negative for hallucinations, substance abuse and suicidal ideas. The patient is nervous/anxious. The patient does not have insomnia.    Blood pressure 112/78, pulse 85, temperature 97.8 F (36.6 C), temperature source Oral, resp. rate 18, height 5\' 6"  (1.676 m), weight 68 kg, SpO2 100%. Body mass index is 24.21 kg/m.  Treatment Plan Summary: Daily contact with patient to assess and evaluate symptoms and progress in treatment and Medication management  36yo M with a history of psychosis, who was admitted to [facility] due to paranoia, not feeling safe from harming self or others. Patient reports paranoid delusions and intrusive thoughts that he can hurt self of others without active suicidal or homicidal ideations. Patient denies recent drug use. 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  Patient continues to report partial improvement of paranoia and sleep with Zyprexa; the dose will be adjusted higher tonight for remaining psychosis.   Diagnoses/ Active problems: -Schizophrenia.  PLAN:  Safety and Monitoring: continue inpatient psych admission; 15-minute checks; daily contact with patient to assess and evaluate symptoms and progress in treatment; psychoeducation.Vital signs: q12 hours. Precautions: suicide, elopement, and assault. Placed on room lock out for meals, snacks and groups.  Psychiatric Problems: -Continue Olanzapine 15 mg PO at bedtime for psychosis.  -Metabolic profile and EKG monitoring obtained while on an atypical antipsychotic .  -Encouraged patient to participate in unit milieu and in scheduled group therapies   Medical  Problems: none  PRN medications:  acetaminophen, acetaminophen, alum & mag hydroxide-simeth, alum & mag hydroxide-simeth, diphenhydrAMINE **OR** diphenhydrAMINE, magnesium hydroxide, magnesium hydroxide, OLANZapine **OR** OLANZapine, traZODone  Pertinent Labs: no new labs ordered today  Consults: No new consults placed since yesterday    Discharge Planning: -Social work and case management to assist with discharge planning and identification of hospital follow-up needs prior to discharge -Estimated LOS: 5 days -Discharge Concerns: Need to establish a safety plan; Medication compliance and effectiveness -Discharge Goals: Return home with outpatient referrals for mental health follow-up including medication management/psychotherapy  Cecilie Lowers, FNP 01/02/2023, 4:16 PM Patient ID: Nikolaus Malecki, male   DOB: 1985-07-14, 37 y.o.   MRN: [id number] Patient ID: Lajohn Croes, male   DOB: [birth year], 37 y.o.   MRN: [id number] Patient ID: Zaron Voeltz, male   DOB: 08/31/1985, 37 y.o.   MRN: [id number] Patient ID: Fidencio Rogus, male   DOB: 20-Dec-1985, 37 y.o.   MRN: [id number]

## 2023-01-02 NOTE — Group Note (Signed)
Recreation Therapy Group Note   Group Topic:Self-Esteem  Group Date: 01/02/2023 Start Time: 1000 End Time: 1030 Facilitators: Cadyn Rodger-McCall, LRT,CTRS Location: [address] Dayroom   Group Topic: Self-esteem   Goal Area(s) Addresses:  Patient will identify and write at least one positive trait about themself. Patient will acknowledge the benefit of healthy self-esteem. Patient will endorse understanding of ways to increase self-esteem.    Intervention: Personalized Plate- printed license plate template, markers or colored pencils   Group Description:  LRT began group session with open dialogue asking the patients to define self-esteem and verbally identify positive qualities and traits people may possess. Patients were then instructed to design a personalized license plate, with words and drawings, representing at least 3 positive things about themselves. Pts were encouraged to include favorites, things they are proud of, what they enjoy doing, and dreams for their future. If a patient had a life motto or a meaningful phase that expressed their life values, pt's were asked to incorporate that into their design as well. Patients were given the opportunity to share their completed work with the group.   Education: Healthy self-esteem, Positive character traits, Accepting compliments, Leisure as competence and coping, Support Systems, Discharge planning LRT educated patients on the importance of healthy self-esteem and ways to build self-esteem. LRT addressed discharge planning reviewing positive coping skills and healthy support systems.   Education Outcome:  Acknowledges education/In group clarification offered   Affect/Mood: N/A   Participation Level: Did not attend    Clinical Observations/Individualized Feedback:     Plan: Continue to engage patient in RT group sessions 2-3x/week.   Kaliyah Gladman-McCall, LRT,CTRS  01/02/2023 11:52 AM

## 2023-01-02 NOTE — Progress Notes (Signed)
   01/02/23 1000  Psych Admission Type (Psych Patients Only)  Admission Status Voluntary  Psychosocial Assessment  Patient Complaints Suspiciousness  Eye Contact Fair  Facial Expression Flat  Affect Depressed;Blunted  Speech Logical/coherent  Interaction Cautious  Motor Activity Restless  Appearance/Hygiene In scrubs  Behavior Characteristics Guarded  Mood Suspicious  Thought Process  Coherency WDL  Content Paranoia  Delusions Paranoid  Perception Hallucinations  Hallucination Auditory  Judgment Limited  Confusion Mild  Danger to Self  Current suicidal ideation? Denies  Agreement Not to Harm Self Yes  Description of Agreement verbal  Danger to Others  Danger to Others None reported or observed

## 2023-01-02 NOTE — Plan of Care (Signed)
  Problem: Education: Goal: Knowledge of [facility] Education information/materials will improve Outcome: Progressing Goal: Emotional status will improve Outcome: Progressing Goal: Mental status will improve Outcome: Progressing Goal: Verbalization of understanding the information provided will improve Outcome: Progressing   

## 2023-01-02 NOTE — Progress Notes (Signed)
   01/02/23 2300  Psych Admission Type (Psych Patients Only)  Admission Status Voluntary  Psychosocial Assessment  Patient Complaints Suspiciousness  Eye Contact Fair  Facial Expression Flat  Affect Appropriate to circumstance  Speech Logical/coherent  Interaction Cautious;Guarded  Motor Activity Restless  Appearance/Hygiene Unremarkable  Behavior Characteristics Guarded  Mood Suspicious  Thought Process  Coherency WDL  Content Paranoia  Delusions Paranoid  Perception WDL  Hallucination None reported or observed  Judgment Limited  Confusion WDL  Danger to Self  Current suicidal ideation? Denies  Agreement Not to Harm Self Yes  Description of Agreement Verbal  Danger to Others  Danger to Others None reported or observed

## 2023-01-02 NOTE — Group Note (Signed)
Date:  01/02/2023 Time:  8:43 PM  Group Topic/Focus:  Wrap-Up Group:   The focus of this group is to help patients review their daily goal of treatment and discuss progress on daily workbooks.    Participation Level:  Active  Participation Quality:  Appropriate and Attentive  Affect:  Appropriate  Cognitive:  Alert and Appropriate  Insight: Appropriate and Good  Engagement in Group:  Engaged  Modes of Intervention:  Discussion and Education  Additional Comments:  Pt attended and participated in wrap up group this evening and rated their day a 6/10. Pt stated that they had a good day and that they are anticipating D/C. Pt goal is to "get home and do my own thing". Pt has no complaints at the moment.   Chrisandra Netters 01/02/2023, 8:43 PM

## 2023-01-02 NOTE — Group Note (Signed)
Date:  01/02/2023 Time:  9:03 AM  Group Topic/Focus:  Goals Group:   The focus of this group is to help patients establish daily goals to achieve during treatment and discuss how the patient can incorporate goal setting into their daily lives to aide in recovery.    Participation Level:  Did Not Attend   Ethan Sutton 01/02/2023, 9:03 AM

## 2023-01-03 ENCOUNTER — Encounter (HOSPITAL_COMMUNITY): Payer: Self-pay

## 2023-01-03 DIAGNOSIS — F2081 Schizophreniform disorder: Secondary | ICD-10-CM | POA: Diagnosis not present

## 2023-01-03 NOTE — Plan of Care (Signed)
  Problem: Education: Goal: Mental status will improve Outcome: Progressing   Problem: Activity: Goal: Interest or engagement in activities will improve Outcome: Progressing   Problem: Coping: Goal: Ability to demonstrate self-control will improve Outcome: Progressing

## 2023-01-03 NOTE — Progress Notes (Signed)
   01/03/23 0900  Psych Admission Type (Psych Patients Only)  Admission Status Voluntary  Psychosocial Assessment  Patient Complaints Suspiciousness  Eye Contact Fair  Facial Expression Flat  Affect Appropriate to circumstance  Speech Logical/coherent  Interaction Cautious  Motor Activity Fidgety;Restless  Appearance/Hygiene In scrubs  Behavior Characteristics Guarded  Mood Suspicious  Thought Process  Coherency WDL  Content Paranoia  Delusions Paranoid  Perception WDL  Hallucination None reported or observed  Judgment Limited  Confusion None  Danger to Self  Current suicidal ideation? Denies  Agreement Not to Harm Self Yes  Description of Agreement verbal  Danger to Others  Danger to Others None reported or observed

## 2023-01-03 NOTE — Group Note (Unsigned)
Date:  01/03/2023 Time:  8:30 PM  Group Topic/Focus:  Wrap-Up Group:   The focus of this group is to help patients review their daily goal of treatment and discuss progress on daily workbooks.     Participation Level:  {BHH PARTICIPATION WNUUV:25366}  Participation Quality:  {BHH PARTICIPATION QUALITY:22265}  Affect:  {BHH AFFECT:22266}  Cognitive:  {BHH COGNITIVE:22267}  Insight: {BHH Insight2:20797}  Engagement in Group:  {BHH ENGAGEMENT IN YQIHK:74259}  Modes of Intervention:  {BHH MODES OF INTERVENTION:22269}  Additional Comments:  ***  Scot Dock 01/03/2023, 8:30 PM

## 2023-01-03 NOTE — Group Note (Unsigned)
Date:  01/03/2023 Time:  9:01 PM  Group Topic/Focus:  Wrap-Up Group:   The focus of this group is to help patients review their daily goal of treatment and discuss progress on daily workbooks.     Participation Level:  {BHH PARTICIPATION HYQMV:78469}  Participation Quality:  {BHH PARTICIPATION QUALITY:22265}  Affect:  {BHH AFFECT:22266}  Cognitive:  {BHH COGNITIVE:22267}  Insight: {BHH Insight2:20797}  Engagement in Group:  {BHH ENGAGEMENT IN GEXBM:84132}  Modes of Intervention:  {BHH MODES OF INTERVENTION:22269}  Additional Comments:  ***  Scot Dock 01/03/2023, 9:01 PM

## 2023-01-03 NOTE — Plan of Care (Signed)
  Problem: Education: Goal: Emotional status will improve Outcome: Progressing Goal: Verbalization of understanding the information provided will improve Outcome: Progressing   Problem: Coping: Goal: Ability to verbalize frustrations and anger appropriately will improve Outcome: Progressing   Problem: Health Behavior/Discharge Planning: Goal: Identification of resources available to assist in meeting health care needs will improve Outcome: Progressing

## 2023-01-03 NOTE — BHH Group Notes (Signed)

## 2023-01-03 NOTE — Group Note (Unsigned)
Date:  01/03/2023 Time:  8:46 PM  Group Topic/Focus:  Wrap-Up Group:   The focus of this group is to help patients review their daily goal of treatment and discuss progress on daily workbooks.     Participation Level:  {BHH PARTICIPATION JXBJY:78295}  Participation Quality:  {BHH PARTICIPATION QUALITY:22265}  Affect:  {BHH AFFECT:22266}  Cognitive:  {BHH COGNITIVE:22267}  Insight: {BHH Insight2:20797}  Engagement in Group:  {BHH ENGAGEMENT IN AOZHY:86578}  Modes of Intervention:  {BHH MODES OF INTERVENTION:22269}  Additional Comments:  ***  Scot Dock 01/03/2023, 8:46 PM

## 2023-01-03 NOTE — Group Note (Signed)
Recreation Therapy Group Note   Group Topic:Goal Setting  Group Date: 01/03/2023 Start Time: 1005 End Time: 1030 Facilitators: Dionta Larke-McCall, LRT,CTRS Location: [address] Dayroom   Group Topic: Goal Setting, Future Planning  Goal Area(s) Addresses:  Patient will identify at least 1 long-term goal for their life.  Patient will reflect on short-term steps they will need to reach their goal(s).  Intervention: Discussion  Group Description: LRT and patients discussed what goals are and how they are used as a guide through life. Patients discussed some of the goals they want to accomplish as the year comes to an end and the coming year. Patients were to also identify any action steps they would use to reach those goals.    Education:  Forward Thinking, Personal Purpose, Goal Setting, Discharge Planning  Education Outcome: Acknowledges Education/In Group Clarification Provided/Needs Additional Education   Affect/Mood: N/A   Participation Level: Did not attend    Clinical Observations/Individualized Feedback:     Plan: Continue to engage patient in RT group sessions 2-3x/week.   Ethan Sutton, LRT,CTRS 01/03/2023 12:14 PM

## 2023-01-03 NOTE — Progress Notes (Signed)
   01/03/23 2300  Psych Admission Type (Psych Patients Only)  Admission Status Voluntary  Psychosocial Assessment  Patient Complaints Suspiciousness  Eye Contact Fair  Facial Expression Flat  Affect Appropriate to circumstance  Speech Logical/coherent  Interaction Cautious;Guarded  Motor Activity Restless  Appearance/Hygiene Unremarkable  Behavior Characteristics Guarded  Mood Suspicious  Thought Process  Coherency WDL  Content Paranoia  Delusions Paranoid  Perception WDL  Hallucination None reported or observed  Judgment Limited  Confusion WDL  Danger to Self  Current suicidal ideation? Denies  Agreement Not to Harm Self Yes  Description of Agreement Verbal  Danger to Others  Danger to Others None reported or observed

## 2023-01-03 NOTE — Group Note (Unsigned)
Date:  01/03/2023 Time:  8:38 PM  Group Topic/Focus:  Wrap-Up Group:   The focus of this group is to help patients review their daily goal of treatment and discuss progress on daily workbooks.     Participation Level:  {BHH PARTICIPATION ZOXWR:60454}  Participation Quality:  {BHH PARTICIPATION QUALITY:22265}  Affect:  {BHH AFFECT:22266}  Cognitive:  {BHH COGNITIVE:22267}  Insight: {BHH Insight2:20797}  Engagement in Group:  {BHH ENGAGEMENT IN UJWJX:91478}  Modes of Intervention:  {BHH MODES OF INTERVENTION:22269}  Additional Comments:  ***  Scot Dock 01/03/2023, 8:38 PM

## 2023-01-03 NOTE — BH IP Treatment Plan (Signed)
Interdisciplinary Treatment and Diagnostic Plan Update  01/03/2023 Time of Session: 12:45 Update Ethan Sutton MRN: [id number]  Principal Diagnosis: Schizophreniform disorder [facility])  Secondary Diagnoses: Principal Problem:   Schizophreniform disorder (HCC)   Current Medications:  Current Facility-Administered Medications  Medication Dose Route Frequency Provider Last Rate Last Admin   acetaminophen (TYLENOL) tablet 650 mg  650 mg Oral Q6H PRN Charm Rings, NP       acetaminophen (TYLENOL) tablet 650 mg  650 mg Oral Q6H PRN Charm Rings, NP       alum & mag hydroxide-simeth (MAALOX/MYLANTA) 200-200-20 MG/5ML suspension 30 mL  30 mL Oral Q4H PRN Charm Rings, NP       alum & mag hydroxide-simeth (MAALOX/MYLANTA) 200-200-20 MG/5ML suspension 30 mL  30 mL Oral Q4H PRN Charm Rings, NP       diphenhydrAMINE (BENADRYL) capsule 50 mg  50 mg Oral BID PRN Charm Rings, NP       Or   diphenhydrAMINE (BENADRYL) injection 50 mg  50 mg Intramuscular BID PRN Charm Rings, NP       feeding supplement (ENSURE ENLIVE / ENSURE PLUS) liquid 237 mL  237 mL Oral BID BM Attiah, Nadir, MD   237 mL at 01/03/23 0834   magnesium hydroxide (MILK OF MAGNESIA) suspension 30 mL  30 mL Oral Daily PRN Charm Rings, NP       magnesium hydroxide (MILK OF MAGNESIA) suspension 30 mL  30 mL Oral Daily PRN Charm Rings, NP       OLANZapine (ZYPREXA) tablet 10 mg  10 mg Oral BID PRN Charm Rings, NP       Or   OLANZapine (ZYPREXA) injection 10 mg  10 mg Intramuscular BID PRN Charm Rings, NP       OLANZapine (ZYPREXA) tablet 15 mg  15 mg Oral QHS Ntuen, Tina C, FNP   15 mg at 01/02/23 2027   traZODone (DESYREL) tablet 100 mg  100 mg Oral QHS PRN Charm Rings, NP   100 mg at 01/02/23 2027   PTA Medications: No medications prior to admission.    Patient Stressors: Legal issue   Medication change or noncompliance   Occupational concerns    Patient Strengths: Forensic psychologist  fund of knowledge   Treatment Modalities: Medication Management, Group therapy, Case management,  1 to 1 session with clinician, Psychoeducation, Recreational therapy.   Physician Treatment Plan for Primary Diagnosis: Schizophreniform disorder (HCC) Long Term Goal(s): Improvement in symptoms so as ready for discharge   Short Term Goals: Ability to identify changes in lifestyle to reduce recurrence of condition will improve Ability to verbalize feelings will improve Ability to disclose and discuss suicidal ideas Ability to demonstrate self-control will improve Ability to identify and develop effective coping behaviors will improve Ability to maintain clinical measurements within normal limits will improve Compliance with prescribed medications will improve Ability to identify triggers associated with substance abuse/mental health issues will improve  Medication Management: Evaluate patient's response, side effects, and tolerance of medication regimen.  Therapeutic Interventions: 1 to 1 sessions, Unit Group sessions and Medication administration.  Evaluation of Outcomes: Progressing  Physician Treatment Plan for Secondary Diagnosis: Principal Problem:   Schizophreniform disorder (HCC)  Long Term Goal(s): Improvement in symptoms so as ready for discharge   Short Term Goals: Ability to identify changes in lifestyle to reduce recurrence of condition will improve Ability to verbalize feelings will improve Ability to disclose and discuss  suicidal ideas Ability to demonstrate self-control will improve Ability to identify and develop effective coping behaviors will improve Ability to maintain clinical measurements within normal limits will improve Compliance with prescribed medications will improve Ability to identify triggers associated with substance abuse/mental health issues will improve     Medication Management: Evaluate patient's response, side effects, and tolerance of medication  regimen.  Therapeutic Interventions: 1 to 1 sessions, Unit Group sessions and Medication administration.  Evaluation of Outcomes: Progressing   RN Treatment Plan for Primary Diagnosis: Schizophreniform disorder (HCC) Long Term Goal(s): Knowledge of disease and therapeutic regimen to maintain health will improve  Short Term Goals: Ability to disclose and discuss suicidal ideas  Medication Management: RN will administer medications as ordered by provider, will assess and evaluate patient's response and provide education to patient for prescribed medication. RN will report any adverse and/or side effects to prescribing provider.  Therapeutic Interventions: 1 on 1 counseling sessions, Psychoeducation, Medication administration, Evaluate responses to treatment, Monitor vital signs and CBGs as ordered, Perform/monitor CIWA, COWS, AIMS and Fall Risk screenings as ordered, Perform wound care treatments as ordered.  Evaluation of Outcomes: Progressing   LCSW Treatment Plan for Primary Diagnosis: Schizophreniform disorder [facility]) Long Term Goal(s): Safe transition to appropriate next level of care at discharge, Engage patient in therapeutic group addressing interpersonal concerns.  Short Term Goals: Engage patient in aftercare planning with referrals and resources and Facilitate acceptance of mental health diagnosis and concerns  Therapeutic Interventions: Assess for all discharge needs, 1 to 1 time with Social worker, Explore available resources and support systems, Assess for adequacy in community support network, Educate family and significant other(s) on suicide prevention, Complete Psychosocial Assessment, Interpersonal group therapy.  Evaluation of Outcomes: Progressing   Progress in Treatment: Attending groups: No. Participating in groups: No. Taking medication as prescribed: Not currently scheduled  Toleration medication: Not currently scheduled Family/Significant other contact made:  No, will contact:  consents pending Patient understands diagnosis: No. Discussing patient identified problems/goals with staff: Yes. Medical problems stabilized or resolved: Yes. Denies suicidal/homicidal ideation: Yes. Issues/concerns per patient self-inventory: No.   New problem(s) identified: No, Describe:  none   New Short Term/Long Term Goal(s): medication stabilization, elimination of SI thoughts, development of comprehensive mental wellness plan.      Patient Goals:  "Get on better medicine"   Discharge Plan or Barriers: Patient recently admitted. CSW will continue to follow and assess for appropriate referrals and possible discharge planning.      Reason for Continuation of Hospitalization: Medication stabilization Suicidal ideation   Estimated Length of Stay: 3-4 days  Last 3 [place] Suicide Severity Risk Score: Flowsheet Row [facility] ([facility]26/2022 in [facility] ED from 05/29/2019 in [facility] Department at [facility]  C-SSRS RISK CATEGORY No Risk No Risk No Risk       Last PHQ 2/9 Scores:     No data to display          Scribe for Treatment Team: Marinda Elk, LCSW 01/03/2023 1:58 PM

## 2023-01-03 NOTE — Progress Notes (Signed)
[facility] MD Progress Note  01/03/2023 3:08 PM Ethan Sutton  MRN:  [id number] Subjective:  Ethan Sutton was seen in his room today on rounds. He feels that he is "alright" overall. He feels that he is sleeping okay, with some of it during the day. His appetite is regular. No new physical complaints. He feels that he is ready to go for tomorrow. He says that he has heat in his housing. He denies current hallucinations, thoughts of harm to self or others. Some paranoia but improved.   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 Previous psychiatric hospitalizations: [facility] 2021 Previous outpatient psychiatrist: unknown Therapist/Counselor: none History of prior suicide attempts: denies Non-suicidal self-injurious behaviors: denies History of violence: denies Previous psych medication: Risperidone Current psych medications: none  Past Medical History:  Past Medical History:  Diagnosis Date   Schizophrenia (HCC)     Past Surgical History:  Procedure Laterality Date   NO PAST SURGERIES     Family History: History reviewed. No pertinent family history. Family Psychiatric  History: Schizophrenia in two brothers. Patient denies suicide in family members. Social History:  Social History   Substance and Sexual Activity  Alcohol Use Not Currently   Comment: ive been in prison for a year i haven't used any     Social History   Substance and Sexual Activity  Drug Use Not Currently    Social History   Socioeconomic History   Marital status: Single    Spouse name: Not on file   Number of children: Not on file   Years of education: Not on file   Highest education level: Not on file  Occupational History   Not on file  Tobacco Use   Smoking status: Former    Current packs/day:  0.00    Types: Cigarettes    Quit date: 2019    Years since quitting: 5.9   Smokeless tobacco: Never  Vaping Use   Vaping status: Never Used  Substance and Sexual Activity   Alcohol use: Not Currently    Comment: ive been in prison for a year i haven't used any   Drug use: Not Currently   Sexual activity: Not Currently    Birth control/protection: Condom, Abstinence  Other Topics Concern   Not on file  Social History Narrative   Not on file   Social Determinants of Health   Financial Resource Strain: Not on file  Food Insecurity: No Food Insecurity (12/27/2022)   Hunger Vital Sign    Worried About Running Out of Food in the Last Year: Never true    Ran Out of Food in the Last Year: Never true  Transportation Needs: No Transportation Needs (12/27/2022)   PRAPARE - Administrator, Civil Service (Medical): No    Lack of Transportation (Non-Medical): No  Physical Activity: Not on file  Stress: Not on file  Social Connections: Not on file   Additional Social History:                         Sleep: Good  Appetite:  Good  Current Medications: Current Facility-Administered Medications  Medication Dose Route Frequency Provider Last Rate Last Admin   acetaminophen (TYLENOL) tablet 650 mg  650 mg Oral Q6H PRN Charm Rings, NP       acetaminophen (TYLENOL) tablet 650  mg  650 mg Oral Q6H PRN Charm Rings, NP       alum & mag hydroxide-simeth (MAALOX/MYLANTA) 200-200-20 MG/5ML suspension 30 mL  30 mL Oral Q4H PRN Charm Rings, NP       alum & mag hydroxide-simeth (MAALOX/MYLANTA) 200-200-20 MG/5ML suspension 30 mL  30 mL Oral Q4H PRN Charm Rings, NP       diphenhydrAMINE (BENADRYL) capsule 50 mg  50 mg Oral BID PRN Charm Rings, NP       Or   diphenhydrAMINE (BENADRYL) injection 50 mg  50 mg Intramuscular BID PRN Charm Rings, NP       feeding supplement (ENSURE ENLIVE / ENSURE PLUS) liquid 237 mL  237 mL Oral BID BM Attiah, Nadir, MD   237  mL at 01/03/23 1458   magnesium hydroxide (MILK OF MAGNESIA) suspension 30 mL  30 mL Oral Daily PRN Charm Rings, NP       magnesium hydroxide (MILK OF MAGNESIA) suspension 30 mL  30 mL Oral Daily PRN Charm Rings, NP       OLANZapine (ZYPREXA) tablet 10 mg  10 mg Oral BID PRN Charm Rings, NP       Or   OLANZapine (ZYPREXA) injection 10 mg  10 mg Intramuscular BID PRN Charm Rings, NP       OLANZapine (ZYPREXA) tablet 15 mg  15 mg Oral QHS Alan Mulder C, FNP   15 mg at 01/02/23 2027   traZODone (DESYREL) tablet 100 mg  100 mg Oral QHS PRN Charm Rings, NP   100 mg at 01/02/23 2027    Lab Results: No results found for this or any previous visit (from the past 48 hour(s)).  Blood Alcohol level:  Lab Results  Component Value Date   ETH <10 12/26/2022   ETH <10 05/25/2019    Metabolic Disorder Labs: No results found for: "HGBA1C", "MPG" No results found for: "PROLACTIN" No results found for: "CHOL", "TRIG", "HDL", "CHOLHDL", "VLDL", "LDLCALC"  Physical Findings: AIMS:  , ,  ,  ,    CIWA:    COWS:     Musculoskeletal: Strength & Muscle Tone: within normal limits Gait & Station: normal Patient leans: N/A  Psychiatric Specialty Exam:  Presentation  General Appearance:  Appropriate for Environment  Eye Contact: Fair  Speech: Normal Rate  Speech Volume: Normal  Handedness: Right   Mood and Affect  Mood: Anxious  Affect: Flat   Thought Process  Thought Processes: Linear  Descriptions of Associations:Intact  Orientation:Full (Time, Place and Person)  Thought Content:Logical  History of Schizophrenia/Schizoaffective disorder:Yes  Duration of Psychotic Symptoms:No data recorded Hallucinations:Hallucinations: None  Ideas of Reference:Paranoia  Suicidal Thoughts:Suicidal Thoughts: No  Homicidal Thoughts:Homicidal Thoughts: No   Sensorium  Memory: Immediate Fair; Recent Fair  Judgment: Fair  Insight: Fair   Restaurant manager, fast food  Concentration: Fair  Attention Span: Fair  Recall: [employer] of Knowledge: Fair  Language: Fair   Psychomotor Activity  Psychomotor Activity: Psychomotor Activity: Normal   Assets  Assets: Physical Health; Leisure Time   Sleep  Sleep: Sleep: Good Number of Hours of Sleep: 3.75    Physical Exam: Physical Exam Vitals and nursing note reviewed.  HENT:     Head: Normocephalic and atraumatic.  Eyes:     Extraocular Movements: Extraocular movements intact.  Pulmonary:     Effort: Pulmonary effort is normal.  Musculoskeletal:        General: Normal range  of motion.     Cervical back: Normal range of motion.  Neurological:     General: No focal deficit present.     Mental Status: He is alert and oriented to person, place, and time.  Psychiatric:        Behavior: Behavior normal.    Review of Systems  Constitutional:  Negative for chills and fever.  Gastrointestinal:  Negative for constipation, diarrhea, nausea and vomiting.  Genitourinary:  Negative for dysuria.  Musculoskeletal:  Negative for myalgias.  Neurological:  Negative for headaches.   Blood pressure 112/78, pulse 85, temperature 97.8 F (36.6 C), temperature source Oral, resp. rate 18, height 5\' 6"  (1.676 m), weight 68 kg, SpO2 100%. Body mass index is 24.21 kg/m.   Treatment Plan Summary: Daily contact with patient to assess and evaluate symptoms and progress in treatment and Medication management   36yo M with a history of psychosis, who was admitted to [facility] due to paranoia, not feeling safe from harming self or others. Patient reports paranoid delusions and intrusive thoughts that he can hurt self of others without active suicidal or homicidal ideations. Patient denies recent drug use. 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  Patient continues to report partial improvement of paranoia and sleep with Zyprexa; the dose will be adjusted higher tonight for  remaining psychosis.     Diagnoses/ Active problems: -Schizophrenia.   PLAN:   Safety and Monitoring: continue inpatient psych admission; 15-minute checks; daily contact with patient to assess and evaluate symptoms and progress in treatment; psychoeducation.Vital signs: q12 hours. Precautions: suicide, elopement, and assault. Placed on room lock out for meals, snacks and groups.   Psychiatric Problems: -Continue Olanzapine 15 mg PO at bedtime for psychosis.   -Metabolic profile and EKG monitoring obtained while on an atypical antipsychotic .   -Encouraged patient to participate in unit milieu and in scheduled group therapies    Medical Problems: none   PRN medications:   acetaminophen, acetaminophen, alum & mag hydroxide-simeth, alum & mag hydroxide-simeth, diphenhydrAMINE **OR** diphenhydrAMINE, magnesium hydroxide, magnesium hydroxide, OLANZapine **OR** OLANZapine, traZODone       Pertinent Labs: no new labs ordered today   Consults: No new consults placed since yesterday    Discharge Planning: -Social work and case management to assist with discharge planning and identification of hospital follow-up needs prior to discharge -Estimated LOS: 5 days -Discharge Concerns: Need to establish a safety plan; Medication compliance and effectiveness -Discharge Goals: Return home with outpatient referrals for mental health follow-up including medication management/psychotherapy  Roselle Locus, MD 01/03/2023, 3:08 PM

## 2023-01-04 DIAGNOSIS — F2081 Schizophreniform disorder: Secondary | ICD-10-CM | POA: Diagnosis not present

## 2023-01-04 MED ORDER — OLANZAPINE 15 MG PO TABS: 15.0000 mg | ORAL_TABLET | Freq: Every day | ORAL | 0 refills | Status: DC

## 2023-01-04 MED ORDER — TRAZODONE HCL 100 MG PO TABS: 100.0000 mg | ORAL_TABLET | Freq: Every evening | ORAL | 0 refills | Status: DC | PRN

## 2023-01-04 NOTE — [facility] Suicide Risk Assessment (Signed)
[facility] INPATIENT:  Family/Significant Other Suicide Prevention Education  Suicide Prevention Education:  Education Completed; Ethan Sutton,  [phone] of family member/significant other) has been identified by the patient as the family member/significant other with whom the patient will be residing, and identified as the person(s) who will aid the patient in the event of a mental health crisis (suicidal ideations/suicide attempt).  With written consent from the patient, the family member/significant other has been provided the following suicide prevention education, prior to the and/or following the discharge of the patient.  The suicide prevention education provided includes the following: Suicide risk factors Suicide prevention and interventions National Suicide Hotline telephone number [facility] assessment telephone number [facility] Emergency Assistance 911 [facility] and/or Residential Mobile Crisis Unit telephone number  Request made of family/significant other to: Remove weapons (e.g., guns, rifles, knives), all items previously/currently identified as safety concern.   Remove drugs/medications (over-the-counter, prescriptions, illicit drugs), all items previously/currently identified as a safety concern.  The family member/significant other verbalizes understanding of the suicide prevention education information provided.  The family member/significant other agrees to remove the items of safety concern listed above.  Ethan Sutton O Ethan Sutton 01/04/2023, 9:03 AM

## 2023-01-04 NOTE — [facility] Suicide Risk Assessment (Signed)
[facility] Discharge Suicide Risk Assessment   Principal Problem: Schizophreniform disorder [facility]) Discharge Diagnoses: Principal Problem:   Schizophreniform disorder (HCC)   Total Time spent with patient: 20 minutes  Musculoskeletal: Strength & Muscle Tone: within normal limits Gait & Station: normal Patient leans: N/A  Psychiatric Specialty Exam  Presentation  General Appearance:  Appropriate for Environment  Eye Contact: Fair  Speech: Normal Rate  Speech Volume: Normal  Handedness: Right   Mood and Affect  Mood: Anxious  Duration of Depression Symptoms: Greater than two weeks  Affect: Flat   Thought Process  Thought Processes: Linear  Descriptions of Associations:Intact  Orientation:Full (Time, Place and Person)  Thought Content:Logical  History of Schizophrenia/Schizoaffective disorder:Yes  Duration of Psychotic Symptoms:No data recorded Hallucinations:Hallucinations: None  Ideas of Reference:Paranoia  Suicidal Thoughts:Suicidal Thoughts: No  Homicidal Thoughts:Homicidal Thoughts: No   Sensorium  Memory: Immediate Fair; Recent Fair  Judgment: Fair  Insight: Fair   Art therapist  Concentration: Fair  Attention Span: Fair  Recall: [employer] of Knowledge: Fair  Language: Fair   Psychomotor Activity  Psychomotor Activity: Psychomotor Activity: Normal   Assets  Assets: Physical Health; Leisure Time   Sleep  Sleep: Sleep: Good   Physical Exam: Physical Exam Vitals and nursing note reviewed.  Constitutional:      Appearance: Normal appearance.  HENT:     Head: Normocephalic and atraumatic.  Eyes:     Extraocular Movements: Extraocular movements intact.  Pulmonary:     Effort: Pulmonary effort is normal.     Breath sounds: Normal breath sounds.  Musculoskeletal:        General: Normal range of motion.     Cervical back: Normal range of motion.  Neurological:     General: No focal deficit present.      Mental Status: He is alert and oriented to person, place, and time.  Psychiatric:        Mood and Affect: Mood normal.        Behavior: Behavior normal.    Review of Systems  Constitutional:  Negative for chills and fever.  HENT:  Negative for hearing loss.   Respiratory:  Negative for cough and shortness of breath.   Cardiovascular:  Negative for chest pain.  Gastrointestinal:  Negative for constipation, diarrhea, nausea and vomiting.  Genitourinary:  Negative for dysuria.  Musculoskeletal:  Negative for myalgias.  Skin:  Negative for rash.  Neurological:  Negative for dizziness and headaches.  Psychiatric/Behavioral:  Negative for hallucinations and suicidal ideas.    Blood pressure 105/74, pulse 79, temperature (!) 97.5 F (36.4 C), temperature source Oral, resp. rate 18, height 5\' 6"  (1.676 m), weight 68 kg, SpO2 100%. Body mass index is 24.21 kg/m.  Mental Status Per Nursing Assessment::   On Admission:  Self-harm thoughts  Demographic Factors:  Male, Low socioeconomic status, and Unemployed  Loss Factors: Decrease in vocational status and Legal issues  Historical Factors: Impulsivity  Risk Reduction Factors:   Religious beliefs about death  Continued Clinical Symptoms:  Alcohol/Substance Abuse/Dependencies Schizophrenia:   Less than 27 years old  Cognitive Features That Contribute To Risk:  Closed-mindedness    Suicide Risk:  Minimal: No identifiable suicidal ideation.  Patients presenting with no risk factors but with morbid ruminations; may be classified as minimal risk based on the severity of the depressive symptoms   Follow-up Information     [name]. Go on 01/15/2023.   Why: You have a hospital follow up appointment on 01/15/23  at 10:00 am. This appointment will be held in person. Following this appointment, you will be scheduled for a clinical assessment to obtain necessary therapy and medication management services. Contact  information: [address] [place] [address] [phone]                 Plan Of Care/Follow-up recommendations:  Activity:  ad lib Diet:  low sodium, low fat  Prescriptions for new medications provided for the patient to bridge to follow up appointment. The patient was informed that refills for these prescriptions are generally not provided, and patient is encouraged to attend all follow up appointments to address medication refills and adjustments.   Today's discharge was reviewed with treatment team, and the team is in agreement that the patient is ready for discharge. The patient is was of the discharge plan for today and has been given opportunity to ask questions. At time of discharge, the patient does not vocalize any acute harm to self or others, is goal directed, able to advocate for self and organizational baseline.   At discharge, the patient is instructed to:  Take all medications as prescribed. Report any adverse effects and or reactions from the medicines to her outpatient provider promptly.  Do not engage in alcohol and/or illegal drug use while on prescription medicines.  In the event of worsening symptoms, patient is instructed to call the crisis hotline, 911 and or go to the nearest ED for appropriate evaluation and treatment of symptoms.  Follow-up with primary care provider for further care of medical issues, concerns and or health care needs. * Substance abuse follow up: it is recommended that you follow up with community support treatment, like AA/NA. It is also recommended that the patient attend 90 meetings in 90 days, otherwise known as "90 in [address]  Roselle Locus, MD 01/04/2023, 9:08 AM

## 2023-01-04 NOTE — Plan of Care (Signed)
  Problem: Education: Goal: Mental status will improve Outcome: Progressing  Problem: Coping: Goal: Ability to verbalize frustrations and anger appropriately will improve Outcome: Progressing Goal: Ability to demonstrate self-control will improve Outcome: Progressing   Problem: Safety: Goal: Periods of time without injury will increase Outcome: Progressing   Problem: Physical Regulation: Goal: Ability to maintain clinical measurements within normal limits will improve Outcome: Progressing     Problem: Activity: Goal: Interest or engagement in activities will improve Outcome: Progressing Goal: Sleeping patterns will improve Outcome: Progressing

## 2023-01-04 NOTE — Progress Notes (Signed)
Patient discharged to home via taxi. Discharge instructions, prescriptions, all required discharge documents and information about follow-up appointment given to pt with verbalization of understanding. All personal belongings returned to pt at time of discharge . Plan of Care resolved. Pt escorted to lobby by RN at 1108.  01/04/23 0800  Psych Admission Type (Psych Patients Only)  Admission Status Voluntary  Psychosocial Assessment  Patient Complaints Anxiety  Eye Contact Fair  Facial Expression Flat  Affect Appropriate to circumstance  Speech Logical/coherent  Interaction Cautious  Motor Activity Restless  Appearance/Hygiene Unremarkable  Behavior Characteristics Appropriate to situation  Mood Anxious  Thought Process  Coherency WDL  Content WDL  Delusions None reported or observed  Perception WDL  Hallucination None reported or observed  Judgment Limited  Confusion WDL  Danger to Self  Current suicidal ideation? Denies  Agreement Not to Harm Self Yes  Description of Agreement Verbal  Danger to Others  Danger to Others None reported or observed

## 2023-01-04 NOTE — Discharge Summary (Signed)
Physician Discharge Summary Note  Patient:  Ethan Sutton is an 37 y.o., male MRN:  [id number] DOB:  1985-09-26 Patient phone:  [phone] (home)  Patient address:   [address] [facility] [id number],  Total Time spent with patient: 20 minutes  Date of Admission:  12/27/2022 Date of Discharge: 01/04/23  Reason for Admission:  per H&P 12/27/22: Patient was brought to the ED with anxiety, suicidal ideation. Per ED physician note "He was at his family's house, when he felt anxious wanted to drink alcohol, and was encouraged to come to the emergency department by family members who are concerned about his mental health.  He is here voluntarily. Denies any drug or alcohol use today.  He did feel anxious.  He felt a desire to hurt himself though denies suicidal ideation, he did feel that he might hurt others.  He has no hallucinations.  He has no other acute medical complaints".   Patient seen in [facility] unit. Patient is calm and cooperative. He reports he was admitted due to "not feeling right". He reports having frequent obsessive thoughts of wanting to hurt self or others and feeling constantly not safe, like something bad will happen to him. He reports he self-medicates with alcohol as "it helps to forget thoughts for a while, but then they come back". He reports feeling tired of having those intrusive thoughts and feeling paranoid, not sleeping well. 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 He reports that "thoughts" got much worse at parents home. He does not think that alcohol causes those thoughts, he again emphasizes that he drinks to self-medicate. He denies any recent drinking though. Reports that he had "only one drink since prison". He denies any other substance use. 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 He denies hearing any "voices",  seeing things other can not or having any other unusual sensations. He admits his two brothers have Schizophrenia, but he thinks his symptoms are different. Reports feeling depressed and anxious due to "thoughts". Denies having current suicidal or homicidal thoughts, plans.   Per chart, consulting NP obtained collateral from patient`s mother, who reported that the patient "has been talking to himself, isolating in his room, urinating on the floor, cutting holes in the wall because he thought people were starring at him and then placing putty in the holes." When I asked the patient about that type of behaviors, he said "I don`t know. I don`t feel safe there".   Per chart review, patient had several ER visits to [facility] for "psychosis", one admission to [facility] for psychosis substance-induced vs. Schizophrenia. Was started on Risperidone at that time.   Patient admits he had past admission. He remembers taking Risperidone, states "it did not work" and he would not like to be restarted on that medication again.    Patient denies any current physical complaints. Denies any chronic medical conditions. Denies any allergies.   ED workup: CBC, CMP - wnl. UDS - negative.  EKG: sinus brady, Qtc .  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 Previous psychiatric hospitalizations: [facility] 2021 Previous outpatient psychiatrist: unknown Therapist/Counselor: none History of prior suicide attempts: denies Non-suicidal self-injurious behaviors: denies History of violence: denies Previous psych medication:  Risperidone Current psych medications: none  Past Medical History:  Past Medical History:  Diagnosis Date   Schizophrenia (HCC)     Past Surgical History:  Procedure Laterality Date   NO PAST SURGERIES     Family  History: History reviewed. No pertinent family history. Family Psychiatric  History: Schizophrenia in two brothers. Patient denies suicide in family members.   Social History: -Patient has no guardian. -Adverse childhood experience: denies h/o physical, emotional, sexual abuse. -Currently lives: with family  -Marital/relationships history: single -no children -Work/Finances: unemployed -Armed forces operational officer History: prison -Guns in possession: denies Social History   Substance and Sexual Activity  Alcohol Use Not Currently   Comment: ive been in prison for a year i haven't used any     Social History   Substance and Sexual Activity  Drug Use Not Currently    Social History   Socioeconomic History   Marital status: Single    Spouse name: Not on file   Number of children: Not on file   Years of education: Not on file   Highest education level: Not on file  Occupational History   Not on file  Tobacco Use   Smoking status: Former    Current packs/day: 0.00    Types: Cigarettes    Quit date: 2019    Years since quitting: 5.9   Smokeless tobacco: Never  Vaping Use   Vaping status: Never Used  Substance and Sexual Activity   Alcohol use: Not Currently    Comment: ive been in prison for a year i haven't used any   Drug use: Not Currently   Sexual activity: Not Currently    Birth control/protection: Condom, Abstinence  Other Topics Concern   Not on file  Social History Narrative   Not on file   Social Determinants of Health   Financial Resource Strain: Not on file  Food Insecurity: No Food Insecurity (12/27/2022)   Hunger Vital Sign    Worried About Running Out of Food in the Last Year: Never true    Ran Out of Food in the Last Year: Never true  Transportation Needs: No Transportation Needs (12/27/2022)   PRAPARE - Administrator, Civil Service (Medical): No    Lack of Transportation (Non-Medical): No  Physical Activity: Not on file  Stress: Not on file  Social  Connections: Not on file    Hospital Course:  During the course of patient's hospitalization, the 15-minute checks were adequate to ensure patient's safety. Patient did not exhibit erratic or aggressive behavior and was compliant with scheduled medication. Patient was recommended for outpatient psychiatry.  At the time of discharge patient is not reporting any acute suicidal/homicidal ideations/AVH, delusional thoughts or paranoia. Patient did not appear to be responding to any internal stimuli. Patient feels more confident about self-care & in managing their mental health problems. Patient currently denies any new issues or concerns. Education and supportive counseling provided throughout patient's hospital stay & upon discharge.   Today upon discharge evaluation, the patient gives a mood of "good". Patient denies any specific concerns. Patient slept well, appetite good, regular bowel movements. Patient denies any new physical complaints. Patient feels that the medications have been helpful & is in agreement to continue current treatment regimen as recommended. Patient was able to engage in safety planning including plan to return to [facility] or contact emergency services if patient feels unable to maintain their own safety or the safety of others. Patient had no further questions, comments, or concerns.  Patient left [facility] with all personal belongings in no apparent distress. Transportation per safe transport to home was arranged for patient.  Physical Findings: AIMS:  , ,  ,  ,    CIWA:    COWS:     Musculoskeletal: Strength & Muscle Tone: within normal limits Gait & Station: normal Patient leans: N/A   Psychiatric Specialty Exam:  Presentation  General Appearance:  Appropriate for Environment  Eye Contact: Good  Speech: Normal Rate  Speech Volume: Normal  Handedness: Right   Mood and Affect  Mood: Euthymic  Affect: Congruent   Thought Process  Thought  Processes: Linear  Descriptions of Associations:Intact  Orientation:Full (Time, Place and Person)  Thought Content:Logical  History of Schizophrenia/Schizoaffective disorder:Yes  Duration of Psychotic Symptoms:No data recorded Hallucinations:Hallucinations: None  Ideas of Reference:None  Suicidal Thoughts:Suicidal Thoughts: No  Homicidal Thoughts:Homicidal Thoughts: No   Sensorium  Memory: Immediate Good; Recent Fair; Remote Fair  Judgment: Fair  Insight: Fair   Art therapist  Concentration: Fair  Attention Span: Fair  Recall: Good  Fund of Knowledge: Good  Language: Good   Psychomotor Activity  Psychomotor Activity: Psychomotor Activity: Normal   Assets  Assets: Physical Health; Leisure Time   Sleep  Sleep: Sleep: Fair    Physical Exam: Physical Exam Vitals and nursing note reviewed.  Constitutional:      Appearance: Normal appearance.  HENT:     Head: Normocephalic and atraumatic.  Eyes:     Extraocular Movements: Extraocular movements intact.  Pulmonary:     Effort: Pulmonary effort is normal.  Musculoskeletal:        General: Normal range of motion.     Cervical back: Normal range of motion.  Neurological:     General: No focal deficit present.     Mental Status: He is alert and oriented to person, place, and time.  Psychiatric:        Mood and Affect: Mood normal.        Behavior: Behavior normal.    Review of Systems  Constitutional:  Negative for chills and fever.  HENT:  Negative for hearing loss.   Respiratory:  Negative for cough and shortness of breath.   Cardiovascular:  Negative for chest pain.  Gastrointestinal:  Negative for constipation, diarrhea, nausea and vomiting.  Genitourinary:  Negative for dysuria.  Musculoskeletal:  Negative for myalgias.  Skin:  Negative for rash.  Neurological:  Negative for dizziness and headaches.  Psychiatric/Behavioral:  Negative for hallucinations and suicidal ideas.     Blood pressure 105/74, pulse 79, temperature (!) 97.5 F (36.4 C), temperature source Oral, resp. rate 18, height 5\' 6"  (1.676 m), weight 68 kg, SpO2 100%. Body mass index is 24.21 kg/m.   Social History   Tobacco Use  Smoking Status Former   Current packs/day: 0.00   Types: Cigarettes   Quit date: 2019   Years since quitting: 5.9  Smokeless Tobacco Never   Tobacco Cessation:  N/A, patient does not currently use tobacco products   Blood Alcohol level:  Lab Results  Component Value Date   ETH <10 12/26/2022   ETH <10 05/25/2019    Metabolic Disorder Labs:  No results found for: "HGBA1C", "MPG" No results found for: "PROLACTIN" No results found for: "CHOL", "TRIG", "HDL", "CHOLHDL", "VLDL", "LDLCALC"  See Psychiatric Specialty Exam and Suicide Risk Assessment completed by Attending Physician prior to discharge.  Discharge destination:  Home  Is patient on multiple antipsychotic therapies at discharge:  No  Discharge Instructions     Diet - low sodium heart healthy   Complete by: As directed    Discharge instructions   Complete by: As directed    Prescriptions for new medications provided for the patient to bridge to follow up appointment. The patient was informed that refills for these prescriptions are generally not provided, and patient is encouraged to attend all follow up appointments to address medication refills and adjustments.   Today's discharge was reviewed with treatment team, and the team is in agreement that the patient is ready for discharge. The patient is was of the discharge plan for today and has been given opportunity to ask questions. At time of discharge, the patient does not vocalize any acute harm to self or others, is goal directed, able to advocate for self and organizational baseline.   At discharge, the patient is instructed to:  Take all medications as prescribed. Report any adverse effects and or reactions from the medicines to her  outpatient provider promptly.  Do not engage in alcohol and/or illegal drug use while on prescription medicines.  In the event of worsening symptoms, patient is instructed to call the crisis hotline, 911 and or go to the nearest ED for appropriate evaluation and treatment of symptoms.  Follow-up with primary care provider for further care of medical issues, concerns and or health care needs. * Substance abuse follow up: it is recommended that you follow up with community support treatment, like AA/NA. It is also recommended that the patient attend 90 meetings in 90 days, otherwise known as "90 in 90"   Increase activity slowly   Complete by: As directed       Allergies as of 01/04/2023   No Known Allergies      Medication List     TAKE these medications      Indication  OLANZapine 15 MG tablet Commonly known as: ZYPREXA Take 1 tablet (15 mg total) by mouth at bedtime.  Indication: Schizophrenia   traZODone 100 MG tablet Commonly known as: DESYREL Take 1 tablet (100 mg total) by mouth at bedtime as needed for sleep.  Indication: Trouble Sleeping        Follow-up Information     [name]. Go on 01/15/2023.   Why: You have a hospital follow up appointment on 01/15/23 at 10:00 am. This appointment will be held in person. Following this appointment, you will be scheduled for a clinical assessment to obtain necessary therapy and medication management services. Contact information: [address] [place] [address] [phone]                 Follow-up recommendations:  Activity:  ad lib Diet:  low sodium, low fat   Prescriptions for new medications provided for the patient to bridge to follow up appointment. The patient was informed that refills for these prescriptions are generally not provided, and patient is encouraged to attend all follow up appointments to address medication refills and adjustments.    Today's discharge was reviewed  with treatment team, and the team is in agreement that the patient is ready for discharge. The patient is was of the discharge plan for today and has been given opportunity to ask questions. At time of discharge, the patient does not vocalize any acute harm to self or others, is goal directed, able to advocate for self and organizational baseline.    At discharge, the patient is instructed to:  Take all medications as prescribed. Report any  adverse effects and or reactions from the medicines to her outpatient provider promptly.  Do not engage in alcohol and/or illegal drug use while on prescription medicines.  In the event of worsening symptoms, patient is instructed to call the crisis hotline, 911 and or go to the nearest ED for appropriate evaluation and treatment of symptoms.  Follow-up with primary care provider for further care of medical issues, concerns and or health care needs. * Substance abuse follow up: it is recommended that you follow up with community support treatment, like AA/NA. It is also recommended that the patient attend 90 meetings in 90 days, otherwise known as "90 in 29"  Signed: Roselle Locus, MD 01/04/2023, 9:13 AM

## 2023-01-04 NOTE — Progress Notes (Signed)
  [facility] Adult Case Management Discharge Plan :  Will you be returning to the same living situation after discharge:  Yes,  Patient is going back to live with mother At discharge, do you have transportation home?: Yes,  Patient is taking [employer] to home Do you have the ability to pay for your medications: Yes,  patient  has insurance.  Release of information consent forms completed and in the chart;  Patient's signature needed at discharge.  Patient to Follow up at:  Follow-up Information     [name]. Go on 01/15/2023.   Why: You have a hospital follow up appointment on 01/15/23 at 10:00 am. This appointment will be held in person. Following this appointment, you will be scheduled for a clinical assessment to obtain necessary therapy and medication management services. Contact information: [address] [place] [address] [phone]                 Next level of care provider has access to [facility] Link:no  Safety Planning and Suicide Prevention discussed: Yes,  Safety Planning was done with mom Tonya     Has patient been referred to the Quitline?: Patient refused referral for treatment  Patient has been referred for addiction treatment: Patient refused referral for treatment; referral information given to patient at discharge.  Glenda Kunst O Naheim Burgen, LCSWA 01/04/2023, 9:35 AM

## 2023-01-10 ENCOUNTER — Ambulatory Visit (HOSPITAL_COMMUNITY)
Admission: EM | Admit: 2023-01-10 | Discharge: 2023-01-11 | Disposition: A | Discharge status: Home / Self Care | Payer: 59 | Attending: Psychiatry | Admitting: Psychiatry

## 2023-01-10 DIAGNOSIS — Z560 Unemployment, unspecified: Secondary | ICD-10-CM | POA: Insufficient documentation

## 2023-01-10 DIAGNOSIS — R45851 Suicidal ideations: Secondary | ICD-10-CM | POA: Insufficient documentation

## 2023-01-10 DIAGNOSIS — Z652 Problems related to release from prison: Secondary | ICD-10-CM | POA: Insufficient documentation

## 2023-01-10 DIAGNOSIS — F1020 Alcohol dependence, uncomplicated: Secondary | ICD-10-CM | POA: Insufficient documentation

## 2023-01-10 DIAGNOSIS — F4320 Adjustment disorder, unspecified: Secondary | ICD-10-CM | POA: Insufficient documentation

## 2023-01-10 LAB — POCT URINE DRUG SCREEN - MANUAL ENTRY (I-SCREEN)
POC Amphetamine UR: NOT DETECTED
POC Buprenorphine (BUP): NOT DETECTED
POC Cocaine UR: NOT DETECTED
POC Marijuana UR: NOT DETECTED
POC Methadone UR: NOT DETECTED
POC Methamphetamine UR: NOT DETECTED
POC Morphine: NOT DETECTED
POC Oxazepam (BZO): NOT DETECTED
POC Oxycodone UR: NOT DETECTED
POC Secobarbital (BAR): NOT DETECTED

## 2023-01-10 MED ORDER — ALUM & MAG HYDROXIDE-SIMETH 200-200-20 MG/5ML PO SUSP: 30.0000 mL | ORAL | Status: DC | PRN

## 2023-01-10 MED ORDER — TRAZODONE HCL 100 MG PO TABS
100.0000 mg | ORAL_TABLET | Freq: Every evening | ORAL | Status: DC | PRN
  Administered 2023-01-10: 100 mg via ORAL
  Filled 2023-01-10: qty 1

## 2023-01-10 MED ORDER — OLANZAPINE 7.5 MG PO TABS
15.0000 mg | ORAL_TABLET | Freq: Every day | ORAL | Status: DC
Start: 2023-01-10 — End: 2023-01-11
  Administered 2023-01-10: 15 mg via ORAL
  Filled 2023-01-10: qty 2
  Filled 2023-01-10: qty 4

## 2023-01-10 MED ORDER — ACETAMINOPHEN 325 MG PO TABS
650.0000 mg | ORAL_TABLET | Freq: Four times a day (QID) | ORAL | Status: DC | PRN
  Administered 2023-01-10: 650 mg via ORAL
  Filled 2023-01-10: qty 2

## 2023-01-10 MED ORDER — TRAZODONE HCL 50 MG PO TABS: 50.0000 mg | ORAL_TABLET | Freq: Every evening | ORAL | Status: DC | PRN

## 2023-01-10 MED ORDER — MAGNESIUM HYDROXIDE 400 MG/5ML PO SUSP: 30.0000 mL | Freq: Every day | ORAL | Status: DC | PRN

## 2023-01-10 NOTE — BH Assessment (Addendum)
Comprehensive Clinical Assessment (CCA) Note  01/10/2023 Ethan Sutton [id number]  DISPOSITION: Patient has been recommended to continuous assessment.  The patient demonstrates the following risk factors for suicide: Chronic risk factors for suicide include: N/A. Acute risk factors for suicide include: N/A. Protective factors for this patient include: coping skills. Considering these factors, the overall suicide risk at this point appears to be low. Patient is appropriate for outpatient follow up.   Patient is a 37 year old male that presents this date as a voluntary walk in with passive S/I. Patient denies any specific plan or intent although states, "he has thought about taking just something from the drug store to overdose on." Patient denies any H/I or AVH. Patient has a history significant for Schizophrenia and reports he has no OP provider at this time to assist with medication management or therapy. Patient has recently been released from prison at the being of November and states he is, "just staying here and there." Patient reports he is currently on parole and is unemployed at this time.  Patient was recently discharged from [facility] on 12/7 after being admitted on 11/29 when he presented with similar symptoms. Patient states he was discharged with medications although did not have the money to get them filled and states his symptoms have worsened over the last few days to include: feeling hopeless and "hearing things sometime." Patient will not elaborate on content of statement and denies any AVH at this time. Patient reports he has been drinking 6 to 12 beers 3 to 5 times a week with last use in the last 48 hours when he reported, "drinking a couple beers." Patient denies any other SA issues. Patient denies access to weapons. Patient states he would like to be restarted on his medications.   Patient is alert and oriented x 5. Patient speaks in a clear voice with normal tone and volume. Patient's  memory appears to be intact with thoughts organized. Patient's mood is depressed with affect congruent. Patient does not appear to be responding to internal stimuli.   Chief Complaint:  Chief Complaint  Patient presents with   Medication Problem   Visit Diagnosis: Schizophrenia     CCA Screening, Triage and Referral (STR)  Patient Reported Information How did you hear about [name]? Self  What Is the Reason for Your Visit/Call Today? Ethan Sutton presents to [facility] unaccompanied. Pt reports [place] was [place]. Pt is looking for medications and an evaluation. Pt states, "I need to get my meds right". Pt does report passive Si, but not at this time. Pt reports that he does not sleep right now and feels irritable mood. Pt does mention taking over-the-counter drugs so that he can sleep. Pt denies substance use, SI, HI and AVH.  How Long Has This Been Causing You Problems? <Week  What Do You Feel Would Help You the Most Today? Medication(s); Treatment for Depression or other mood problem   Have You Recently Had Any Thoughts About Hurting Yourself? Yes (passive SI)  Are You Planning to Commit Suicide/Harm Yourself At This time? No   Flowsheet Row ED from 01/10/2023 in [facility] Admission (Discharged) from 12/27/2022 in [facility] ED from 12/26/2022 in [facility] Emergency Department [facility]  C-SSRS RISK CATEGORY High Risk No Risk No Risk       Have you Recently Had Thoughts About Hurting Someone [name]? No  Are You Planning to Harm Someone at This Time?  No  Explanation: NA   Have You Used Any Alcohol or Drugs in the Past 24 Hours? No  What Did You Use and How Much? NA  Do You Currently Have a Therapist/Psychiatrist? No Name of Therapist/Psychiatrist: Name of Therapist/Psychiatrist: No OP providers or therapist at this time   Have You Been Recently Discharged From Any Office Practice or Programs?  No  Explanation of Discharge From Practice/Program: NA     CCA Screening Triage Referral Assessment Type of Contact: Face-to-Face  Telemedicine Service Delivery: 01/10/2023  Is this Initial or Reassessment? Initial  Date Telepsych consult ordered in [facility]: 01/10/2023   Time Telepsych consult ordered in [facility]:  1600  Location of Assessment[facility] Assessment Services  Provider Location: [facility] Assessment Services   Collateral Involvement: None noted   Does Patient Have a Automotive engineer Guardian? No  Legal Guardian Contact Information: NA  Copy of Legal Guardianship Form: -- (NA)  Legal Guardian Notified of Arrival: -- (NA)  Legal Guardian Notified of Pending Discharge: -- (NA)  If Minor and Not Living with Parent(s), Who has Custody? NA  Is CPS involved or ever been involved? Never  Is APS involved or ever been involved? Never   Patient Determined To Be At Risk for Harm To Self or Others Based on Review of Patient Reported Information or Presenting Complaint? Yes, for Self-Harm  Method: No Plan  Availability of Means: No access or NA  Intent: Vague intent or NA  Notification Required: No need or identified person  Additional Information for Danger to Others Potential: -- (NA)  Additional Comments for Danger to Others Potential: None noted  Are There Guns or Other Weapons in Your Home? No  Types of Guns/Weapons: NA  Are These Weapons Safely Secured?                            -- (NA)  Who Could Verify You Are Able To Have These Secured: NA  Do You Have any Outstanding Charges, Pending Court Dates, Parole/Probation? Pt states he is currently on parole  Contacted To Inform of Risk of Harm To Self or Others: -- (NA)    Does Patient Present under Involuntary Commitment? No    [place] of Residence: [place]   Patient Currently Receiving the Following Services: Not Receiving Services   Determination of Need: Urgent (48 hours)   Options For  Referral: Other: Comment (Continuious assessment)     CCA Biopsychosocial Patient Reported Schizophrenia/Schizoaffective Diagnosis in Past: No   Strengths: Pt is willing to participate in treatment   Mental Health Symptoms Depression:  Difficulty Concentrating; Change in energy/activity   Duration of Depressive symptoms: Duration of Depressive Symptoms: Less than two weeks   Mania:  None   Anxiety:   N/A   Psychosis:  None   Duration of Psychotic symptoms:    Trauma:  N/A   Obsessions:  N/A   Compulsions:  N/A   Inattention:  N/A   Hyperactivity/Impulsivity:  N/A   Oppositional/Defiant Behaviors:  N/A   Emotional Irregularity:  N/A   Other Mood/Personality Symptoms:  None noted    Mental Status Exam Appearance and self-care  Stature:  Average   Weight:  Average weight   Clothing:  Casual   Grooming:  Normal   Cosmetic use:  None   Posture/gait:  Normal   Motor activity:  Not Remarkable   Sensorium  Attention:  Normal   Concentration:  Normal  Orientation:  X5   Recall/memory:  Normal   Affect and Mood  Affect:  Appropriate   Mood:  Anxious   Relating  Eye contact:  Fleeting   Facial expression:  Responsive   Attitude toward examiner:  Cooperative   Thought and Language  Speech flow: Clear and Coherent   Thought content:  Appropriate to Mood and Circumstances   Preoccupation:  None   Hallucinations:  None   Organization:  Intact; Goal-directed   [employer] of Knowledge:  Fair   Intelligence:  Average   Abstraction:  Normal   Judgement:  Fair   Dance movement psychotherapist:  Realistic   Insight:  Fair   Decision Making:  Normal   Social Functioning  Social Maturity:  Responsible   Social Judgement:  Normal   Stress  Stressors:  Housing   Coping Ability:  Exhausted; Overwhelmed   Skill Deficits:  None   Supports:  Family     Religion: Religion/Spirituality Are You A Religious Person?: No How  Might This Affect Treatment?: NA  Leisure/Recreation: Leisure / Recreation Do You Have Hobbies?: No  Exercise/Diet: Exercise/Diet Do You Exercise?: No Have You Gained or Lost A Significant Amount of Weight in the Past Six Months?: No Do You Follow a Special Diet?: No Do You Have Any Trouble Sleeping?: No   CCA Employment/Education Employment/Work Situation: Employment / Work Situation Employment Situation: Unemployed Patient's Job has Been Impacted by Current Illness: No Has Patient ever Been in Equities trader?: No  Education: Education Is Patient Currently Attending School?: No Last Grade Completed: 12 Did You Product manager?: No Did You Have An Individualized Education Program (IIEP): No Did You Have Any Difficulty At [employer]?: No Patient's Education Has Been Impacted by Current Illness: No   CCA Family/Childhood History Family and Relationship History: Family history Marital status: Single Does patient have children?: No  Childhood History:  Childhood History By whom was/is the patient raised?: Mother Description of patient's current relationship with siblings: NA Did patient suffer any verbal/emotional/physical/sexual abuse as a child?: No Did patient suffer from severe childhood neglect?: No Has patient ever been sexually abused/assaulted/raped as an adolescent or adult?: No Was the patient ever a victim of a crime or a disaster?: No Witnessed domestic violence?: No Has patient been affected by domestic violence as an adult?: No       CCA Substance Use Alcohol/Drug Use: Alcohol / Drug Use Pain Medications: See PTA Prescriptions: See PTA Over the Counter: See PTA History of alcohol / drug use?: Yes Longest period of sobriety (when/how long): Unknown Negative Consequences of Use:  (NA) Withdrawal Symptoms: None Substance #1 Name of Substance 1: Alcohol 1 - Age of First Use: 16 1 - Amount (size/oz): Pt states amounts vary from 6 to 12 beers 1 -  Frequency: 3 to 5 times a week 1 - Duration: Ongoing for last month 1 - Last Use / Amount: Pt states in the last 48 hours he had, "a couple beers." 1 - Method of Aquiring: Legal 1- Route of Use: Oral/Drinking                       ASAM's:  Six Dimensions of Multidimensional Assessment  Dimension 1:  Acute Intoxication and/or Withdrawal Potential:   Dimension 1:  Description of individual's past and current experiences of substance use and withdrawal: Fully functioning  Dimension 2:  Biomedical Conditions and Complications:   Dimension 2:  Description of patient's biomedical conditions and  complications: Fully functioning  Dimension 3:  Emotional, Behavioral, or Cognitive Conditions and Complications:  Dimension 3:  Description of emotional, behavioral, or cognitive conditions and complications: Good impulse control  Dimension 4:  Readiness to Change:  Dimension 4:  Description of Readiness to Change criteria: Willing to engage in treatment  Dimension 5:  Relapse, Continued use, or Continued Problem Potential:  Dimension 5:  Relapse, continued use, or continued problem potential critiera description: Minimal relapse potential  Dimension 6:  Recovery/Living Environment:  Dimension 6:  Recovery/Iiving environment criteria description: Has a supportive environment  ASAM Severity Score: ASAM's Severity Rating Score: 1  ASAM Recommended Level of Treatment: ASAM Recommended Level of Treatment: Level II Intensive Outpatient Treatment   Substance use Disorder (SUD) Substance Use Disorder (SUD)  Checklist Symptoms of Substance Use: Continued use despite having a persistent/recurrent physical/psychological problem caused/exacerbated by use  Recommendations for Services/Supports/Treatments: Recommendations for Services/Supports/Treatments Recommendations For Services/Supports/Treatments: Other (Comment) (Continuious observation)  Discharge Disposition:    DSM5 Diagnoses: Patient Active  Problem List   Diagnosis Date Noted   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

## 2023-01-10 NOTE — Progress Notes (Signed)
   01/10/23 1157  [facility] Triage Screening (Walk-ins at [facility] only)  How Did You Hear About [name]? Self  What Is the Reason for Your Visit/Call Today? [place] presents to [facility] unaccompanied. Pt reports he was [place]. Pt is looking for medications and an evaluation. Pt states, "I need to get my meds right". Pt does report passive Si, but not at this time. Pt reports that he does not sleep right now and feels irritable mood. Pt does mention taking over-the-counter drugs so that he can sleep. Pt denies substance use, si, hi and avh currently.  How Long Has This Been Causing You Problems? <Week  Have You Recently Had Any Thoughts About Hurting Yourself? No  Are You Planning to Commit Suicide/Harm Yourself At This time? No  Have you Recently Had Thoughts About Hurting Someone [name]? No  Are You Planning To Harm Someone At This Time? No  Physical Abuse Denies  Verbal Abuse Denies  Sexual Abuse Denies  Exploitation of patient/patient's resources Denies  Self-Neglect Denies  Possible abuse reported to: Other (Comment)  Are you currently experiencing any auditory, visual or other hallucinations? No  Have You Used Any Alcohol or Drugs in the Past 24 Hours? No  Do you have any current medical co-morbidities that require immediate attention? No  Clinician description of patient physical appearance/behavior: calm, cooperative  What Do You Feel Would Help You the Most Today? Medication(s)  If access to [facility] was not available, would you have sought care in the Emergency Department? No  Determination of Need Routine (7 days)  Options For Referral Medication Management

## 2023-01-10 NOTE — ED Provider Notes (Signed)
[facility] Continuous Assessment Admission H&P  Date: 01/10/23 Patient Name: Ethan Sutton MRN: [id number] Chief Complaint: I need to get medication for my schizophrenia  Diagnoses:  Final diagnoses:  Alcohol use disorder, moderate, dependence (HCC)  Suicidal ideation    HPI: Ethan Sutton 37 y.o., male patient presented to [facility] as a walk in  accompanied voluntarily by self with complaints of needing medication , has been using alcohol to treat his schizophrenia. Patient expressed desire to OD on OTC medications. He stated he went to store to buy OTC meds and stated that if he leaves here he will overdose.   [name], 38 y.o., male patient seen face to face by this provider, consulted with Dr. Clovis Riley; and chart reviewed on 01/10/23.  On evaluation [place] Booher reports  During evaluation Haoyu Farnan is sitting (position) in no acute distress. He is alert, oriented x 4, calm, cooperative and attentive.  His mood is anxious and seems worried about being discharged  with congruent affect.  He has normal speech, and behavior.  Objectively there is no evidence of psychosis/mania or delusional thinking.  Patient is able to converse coherently, goal directed thoughts, no distractibility. Patient has a history of suspected malingering. He expresses desire to hurt himself psychosis, and paranoia.  Patient answered question appropriately.     Total Time spent with patient: 30 minutes  Musculoskeletal  Strength & Muscle Tone: within normal limits Gait & Station: normal Patient leans: N/A  Psychiatric Specialty Exam  Presentation General Appearance:  Appropriate for Environment  Eye Contact: Good  Speech: Normal Rate  Speech Volume: Normal  Handedness: Right   Mood and Affect  Mood: Euthymic  Affect: Congruent   Thought Process  Thought Processes: Linear  Descriptions of Associations:Intact  Orientation:Full (Time, Place and Person)  Thought  Content:Logical  Diagnosis of Schizophrenia or Schizoaffective disorder in past: Yes   Hallucinations:Hallucinations: None  Ideas of Reference:None  Suicidal Thoughts:Suicidal Thoughts: Yes, Active (patient has a plan to o/d on OTC medications) SI Active Intent and/or Plan: With Plan; With Means to Carry Out  Homicidal Thoughts:Homicidal Thoughts: No   Sensorium  Memory: Immediate Good; Recent Fair; Remote Fair  Judgment: Fair  Insight: Fair   Art therapist  Concentration: Fair  Attention Span: Fair  Recall: Good  Fund of Knowledge: Good  Language: Good   Psychomotor Activity  Psychomotor Activity: Psychomotor Activity: Normal   Assets  Assets: Communication Skills; Desire for Improvement; Physical Health   Sleep  Sleep: Sleep: Fair Number of Hours of Sleep: 4   Nutritional Assessment (For OBS and FBC admissions only) Has the patient had a weight loss or gain of 10 pounds or more in the last 3 months?: No Has the patient had a decrease in food intake/or appetite?: No Does the patient have dental problems?: No Does the patient have eating habits or behaviors that may be indicators of an eating disorder including binging or inducing vomiting?: No Has the patient recently lost weight without trying?: 0 Has the patient been eating poorly because of a decreased appetite?: 0 Malnutrition Screening Tool Score: 0    Physical Exam HENT:     Head: Normocephalic.     Nose: Nose normal.  Pulmonary:     Effort: Pulmonary effort is normal.  Musculoskeletal:     Cervical back: Normal range of motion.  Skin:    General: Skin is warm and dry.  Neurological:     Mental Status: He is alert.  Psychiatric:  Behavior: Behavior normal.   ROS  Blood pressure 124/72, pulse 85, temperature 98.7 F (37.1 C), temperature source Oral, resp. rate 19, SpO2 100%. There is no height or weight on file to calculate BMI.  Past Psychiatric History:   Past Medical History:  Diagnosis Date   Schizophrenia (HCC)       Is the patient at risk to self? Yes Has the patient been a risk to self in the past 6 months? No .    Has the patient been a risk to self within the distant past? No   Is the patient a risk to others? No   Has the patient been a risk to others in the past 6 months? No   Has the patient been a risk to others within the distant past? No   Past Medical History:  Past Medical History:  Diagnosis Date   Schizophrenia (HCC)      Family History: Breast Cancer-related family history is not on file.   Social History:  Social History   Socioeconomic History   Marital status: Single    Spouse name: Not on file   Number of children: Not on file   Years of education: Not on file   Highest education level: Not on file  Occupational History   Not on file  Tobacco Use   Smoking status: Former    Current packs/day: 0.00    Types: Cigarettes    Quit date: 2019    Years since quitting: 5.9   Smokeless tobacco: Never  Vaping Use   Vaping status: Never Used  Substance and Sexual Activity   Alcohol use: Not Currently    Comment: ive been in prison for a year i haven't used any   Drug use: Not Currently   Sexual activity: Not Currently    Birth control/protection: Condom, Abstinence  Other Topics Concern   Not on file  Social History Narrative   Not on file   Social Drivers of Health   Financial Resource Strain: Not on file  Food Insecurity: No Food Insecurity (12/27/2022)   Hunger Vital Sign    Worried About Running Out of Food in the Last Year: Never true    Ran Out of Food in the Last Year: Never true  Transportation Needs: No Transportation Needs (12/27/2022)   PRAPARE - Administrator, Civil Service (Medical): No    Lack of Transportation (Non-Medical): No  Physical Activity: Not on file  Stress: Not on file  Social Connections: Not on file  Intimate Partner Violence: Not At Risk  (12/27/2022)   Humiliation, Afraid, Rape, and Kick questionnaire    Fear of Current or Ex-Partner: No    Emotionally Abused: No    Physically Abused: No    Sexually Abused: No     Last Labs:  Admission on 12/26/2022, Discharged on 12/27/2022  Component Date Value Ref Range Status   Sodium 12/26/2022 137  135 - 145 mmol/L Final   Potassium 12/26/2022 3.6  3.5 - 5.1 mmol/L Final   Chloride 12/26/2022 103  98 - 111 mmol/L Final   CO2 12/26/2022 24  22 - 32 mmol/L Final   Glucose, Bld 12/26/2022 99  70 - 99 mg/dL Final   Glucose reference range applies only to samples taken after fasting for at least 8 hours.   BUN 12/26/2022 13  6 - 20 mg/dL Final   Creatinine, Ser 12/26/2022 0.75  0.61 - 1.24 mg/dL Final   Calcium 84/69/6295 8.9  8.9 -  10.3 mg/dL Final   Total Protein 29/56/2130 7.8  6.5 - 8.1 g/dL Final   Albumin 86/57/8469 4.3  3.5 - 5.0 g/dL Final   AST 62/95/2841 38  15 - 41 U/L Final   ALT 12/26/2022 41  0 - 44 U/L Final   Alkaline Phosphatase 12/26/2022 90  38 - 126 U/L Final   Total Bilirubin 12/26/2022 0.6  <1.2 mg/dL Final   GFR, Estimated 12/26/2022 >60  >60 mL/min Final   Comment: (NOTE) Calculated using the CKD-EPI Creatinine Equation (2021)    Anion gap 12/26/2022 10  5 - 15 Final   Performed at [facility], [address] [place], [place] [address]   Alcohol, Ethyl (B) 12/26/2022 <10  <10 mg/dL Final   Comment: (NOTE) Lowest detectable limit for serum alcohol is 10 mg/dL.  For medical purposes only. Performed at [facility], [address] [place], [place] [address]    Salicylate Lvl 12/26/2022 <7.0 (L)  7.0 - 30.0 mg/dL Final   Performed at [facility], [address] [place], [place] [address]   Acetaminophen (Tylenol), Serum 12/26/2022 <10 (L)  10 - 30 ug/mL Final   Comment: (NOTE) Therapeutic concentrations vary significantly. A range of 10-30 ug/mL  may be an effective concentration for many patients. However, some   are best treated at concentrations outside of this range. Acetaminophen concentrations >150 ug/mL at 4 hours after ingestion  and >50 ug/mL at 12 hours after ingestion are often associated with  toxic reactions.  Performed at [facility], [address] [place], [place] [address]    WBC 12/26/2022 7.5  4.0 - 10.5 K/uL Final   RBC 12/26/2022 4.40  4.22 - 5.81 MIL/uL Final   Hemoglobin 12/26/2022 13.5  13.0 - 17.0 g/dL Final   HCT 42/59/5638 41.2  39.0 - 52.0 % Final   MCV 12/26/2022 93.6  80.0 - 100.0 fL Final   MCH 12/26/2022 30.7  26.0 - 34.0 pg Final   MCHC 12/26/2022 32.8  30.0 - 36.0 g/dL Final   RDW 75/64/3329 14.0  11.5 - 15.5 % Final   Platelets 12/26/2022 309  150 - 400 K/uL Final   nRBC 12/26/2022 0.0  0.0 - 0.2 % Final   Performed at [facility], [address] [place], [place] [address]   Tricyclic, Ur Screen 12/26/2022 NONE DETECTED  NONE DETECTED Final   Amphetamines, Ur Screen 12/26/2022 NONE DETECTED  NONE DETECTED Final   MDMA (Ecstasy)Ur Screen 12/26/2022 NONE DETECTED  NONE DETECTED Final   Cocaine Metabolite,Ur [place] 12/26/2022 NONE DETECTED  NONE DETECTED Final   Opiate, Ur Screen 12/26/2022 NONE DETECTED  NONE DETECTED Final   Phencyclidine (PCP) Ur S 12/26/2022 NONE DETECTED  NONE DETECTED Final   Cannabinoid 50 Ng, Ur [place] 12/26/2022 NONE DETECTED  NONE DETECTED Final   Barbiturates, Ur Screen 12/26/2022 NONE DETECTED  NONE DETECTED Final   Benzodiazepine, Ur Scrn 12/26/2022 NONE DETECTED  NONE DETECTED Final   Methadone Scn, Ur 12/26/2022 NONE DETECTED  NONE DETECTED Final   Comment: (NOTE) Tricyclics + metabolites, urine    Cutoff 1000 ng/mL Amphetamines + metabolites, urine  Cutoff 1000 ng/mL MDMA (Ecstasy), urine              Cutoff 500 ng/mL Cocaine Metabolite, urine          Cutoff 300 ng/mL Opiate + metabolites, urine        Cutoff 300 ng/mL Phencyclidine (PCP), urine  Cutoff 25 ng/mL Cannabinoid, urine                  Cutoff 50 ng/mL Barbiturates + metabolites, urine  Cutoff 200 ng/mL Benzodiazepine, urine              Cutoff 200 ng/mL Methadone, urine                   Cutoff 300 ng/mL  The urine drug screen provides only a preliminary, unconfirmed analytical test result and should not be used for non-medical purposes. Clinical consideration and professional judgment should be applied to any positive drug screen result due to possible interfering substances. A more specific alternate chemical method must be used in order to obtain a confirmed analytical result. Gas chromatography / mass spectrometry (GC/MS) is the preferred confirm                          atory method. Performed at [facility], [address], [place], [place] [address]     Allergies: Patient has no known allergies.  Medications:  PTA Medications  Medication Sig   traZODone (DESYREL) 100 MG tablet Take 1 tablet (100 mg total) by mouth at bedtime as needed for sleep.   OLANZapine (ZYPREXA) 15 MG tablet Take 1 tablet (15 mg total) by mouth at bedtime.      Medical Decision Making  Patient, on assessment is deemed appropriate for overnight observation unit. Patient mentioned at least 3 times that he wants to O/D on OTC medications. Patient also stated that he needs his "medications for schizophrenia" He was previously discharged from [facility] with Zyprexa  15 mgs and Trazodone 100 mg (HS). These medications will be continued. Patient states that he drinks to deal with his symptoms 7-8 beers/night. Patient does have a history of malingering. Patient is unemployed and recently released from prison (Nov.), and is on parole. Patient states he would like to go back to [facility]. However, at this time, patient is not appropriate for [facility].  Patient states that he can return to his mother's home in [place] if provided transportation.     Recommendations  Patient will be admitted to overnight observation  Donato Heinz,  NP 01/10/23  2:40 PM

## 2023-01-10 NOTE — ED Notes (Signed)
Patient complaint of headache. Asking for medication.

## 2023-01-10 NOTE — ED Notes (Signed)

## 2023-01-11 ENCOUNTER — Emergency Department (HOSPITAL_COMMUNITY)
Admission: EM | Admit: 2023-01-11 | Discharge: 2023-01-13 | Disposition: A | Discharge status: Home / Self Care | Payer: Self-pay | Attending: Emergency Medicine | Admitting: Emergency Medicine

## 2023-01-11 ENCOUNTER — Other Ambulatory Visit: Payer: Self-pay

## 2023-01-11 ENCOUNTER — Encounter (HOSPITAL_COMMUNITY): Payer: Self-pay

## 2023-01-11 DIAGNOSIS — F259 Schizoaffective disorder, unspecified: Secondary | ICD-10-CM | POA: Insufficient documentation

## 2023-01-11 DIAGNOSIS — R45851 Suicidal ideations: Secondary | ICD-10-CM

## 2023-01-11 DIAGNOSIS — F29 Unspecified psychosis not due to a substance or known physiological condition: Secondary | ICD-10-CM | POA: Insufficient documentation

## 2023-01-11 LAB — RAPID URINE DRUG SCREEN, HOSP PERFORMED
Amphetamines: NOT DETECTED
Barbiturates: NOT DETECTED
Benzodiazepines: NOT DETECTED
Cocaine: NOT DETECTED
Opiates: NOT DETECTED
Tetrahydrocannabinol: NOT DETECTED

## 2023-01-11 LAB — ETHANOL: Alcohol, Ethyl (B): <10 mg/dL (ref <10)

## 2023-01-11 LAB — COMPREHENSIVE METABOLIC PANEL
ALT: 41 U/L (ref 0–44)
AST: 22 U/L (ref 15–41)
Albumin: 3.8 g/dL (ref 3.5–5.0)
Alkaline Phosphatase: 71 U/L (ref 38–126)
Anion gap: 9 (ref 5–15)
BUN: 11 mg/dL (ref 6–20)
CO2: 24 mmol/L (ref 22–32)
Calcium: 9 mg/dL (ref 8.9–10.3)
Chloride: 105 mmol/L (ref 98–111)
Creatinine, Ser: 0.8 mg/dL (ref 0.61–1.24)
GFR, Estimated: >60 mL/min (ref >60)
Glucose, Bld: 105 mg/dL — ABNORMAL HIGH (ref 70–99)
Potassium: 3.8 mmol/L (ref 3.5–5.1)
Sodium: 138 mmol/L (ref 135–145)
Total Bilirubin: 0.9 mg/dL (ref <1.2)
Total Protein: 7.2 g/dL (ref 6.5–8.1)

## 2023-01-11 LAB — CBC
HCT: 42.4 % (ref 39.0–52.0)
Hemoglobin: 13.8 g/dL (ref 13.0–17.0)
MCH: 30.7 pg (ref 26.0–34.0)
MCHC: 32.5 g/dL (ref 30.0–36.0)
MCV: 94.4 fL (ref 80.0–100.0)
Platelets: 260 10*3/uL (ref 150–400)
RBC: 4.49 MIL/uL (ref 4.22–5.81)
RDW: 13.9 % (ref 11.5–15.5)
WBC: 7.8 10*3/uL (ref 4.0–10.5)
nRBC: 0 % (ref 0.0–0.2)

## 2023-01-11 LAB — ACETAMINOPHEN LEVEL: Acetaminophen (Tylenol), Serum: <10 ug/mL — ABNORMAL LOW (ref 10–30)

## 2023-01-11 LAB — SALICYLATE LEVEL: Salicylate Lvl: <7 mg/dL — ABNORMAL LOW (ref 7.0–30.0)

## 2023-01-11 NOTE — ED Notes (Signed)
Patient discharged. Belongings returned from locker #17. Patient walked to the front lobby by staff. Bus pass provided. Safety maintained.

## 2023-01-11 NOTE — ED Notes (Signed)
Patient is alert & oriented X 4. He denies SI/HI or AVH. He denies physical pain or discomfort. Patient is aware that he is being discharged. AVS has been printed and reviewed with pt, understanding voiced. Patient provided lunch. Bus pass will be provided.

## 2023-01-11 NOTE — ED Notes (Signed)
Per TTS pt is to remain under observation and to be seen by psychiatry in AM

## 2023-01-11 NOTE — ED Triage Notes (Signed)
This patient has bounced around between [facility], [facility] and now here claiming suicidal and hearing voices.  Each facility has told him to he needs to follow up with [name] which he has an appointment dec 18.  Patient states "I'm not feeling well I have already bought OTC meds and I'm hearing voices".  When asked what they are saying he just keeps stating "like like like....".  [facility] gave him 4 days worth of zyprexa and he reports it aint workin.  Patient was just discharged today from [facility].

## 2023-01-11 NOTE — ED Provider Notes (Signed)
[place] EMERGENCY DEPARTMENT AT [facility] Provider Note   CSN: [id number] Arrival date & time: 01/11/23  1537     History  Chief Complaint  Patient presents with   Psychiatric Evaluation    Sudais Widmeyer is a 37 y.o. male here for suicide he can and hear voices.  He states he was recently at behavioral health he felt good at discharge however he states the voices have returned.  He has thoughts of overdosing on OTC medications.  He states he has also been at [facility] and stated "they did not believe me."  He states his medications are not working.  He denies headache, nausea, vomiting, numbness, weakness, chest pain, shortness of breath abdominal pain.  He denies any EtOH use or substance use.   He does NOT give me permission to discuss today's visit with his emergency contact in the computer  HPI     Home Medications Prior to Admission medications   Medication Sig Start Date End Date Taking? Authorizing Provider  OLANZapine (ZYPREXA) 15 MG tablet Take 1 tablet (15 mg total) by mouth at bedtime. 01/04/23   Roselle Locus, MD  traZODone (DESYREL) 100 MG tablet Take 1 tablet (100 mg total) by mouth at bedtime as needed for sleep. 01/04/23   Roselle Locus, MD      Allergies    Patient has no known allergies.    Review of Systems   Review of Systems  Constitutional: Negative.   HENT: Negative.    Respiratory: Negative.    Cardiovascular: Negative.   Gastrointestinal: Negative.   Genitourinary: Negative.   Skin: Negative.   Neurological: Negative.   Psychiatric/Behavioral:  Positive for suicidal ideas.   All other systems reviewed and are negative.   Physical Exam Updated Vital Signs BP 117/80 (BP Location: Right Arm)   Pulse (!) 115   Temp 98.1 F (36.7 C)   Resp 16   Ht 5\' 6"  (1.676 m)   Wt 68 kg   SpO2 100%   BMI 24.21 kg/m  Physical Exam Vitals and nursing note reviewed.  Constitutional:      General: He is not in  acute distress.    Appearance: He is well-developed. He is not ill-appearing or diaphoretic.  HENT:     Head: Atraumatic.  Eyes:     Pupils: Pupils are equal, round, and reactive to light.  Cardiovascular:     Rate and Rhythm: Normal rate and regular rhythm.  Pulmonary:     Effort: Pulmonary effort is normal. No respiratory distress.  Abdominal:     General: There is no distension.     Palpations: Abdomen is soft.  Musculoskeletal:        General: Normal range of motion.     Cervical back: Normal range of motion and neck supple.  Skin:    General: Skin is warm and dry.     Capillary Refill: Capillary refill takes less than 2 seconds.  Neurological:     General: No focal deficit present.     Mental Status: He is alert and oriented to person, place, and time.  Psychiatric:        Attention and Perception: He is attentive. He does not perceive visual hallucinations.        Mood and Affect: Affect is flat.        Speech: Speech normal.        Behavior: Behavior normal. Behavior is cooperative.  Thought Content: Thought content is not paranoid or delusional. Thought content includes suicidal ideation. Thought content does not include homicidal ideation. Thought content includes suicidal plan. Thought content does not include homicidal plan.     Comments: Flat affect, admits to SI with plan to overdose, denies HI. Admits to auditory hallucinations however cannot tell me what they are saying.  Denies visual hallucinations     ED Results / Procedures / Treatments   Labs (all labs ordered are listed, but only abnormal results are displayed) Labs Reviewed  COMPREHENSIVE METABOLIC PANEL - Abnormal; Notable for the following components:      Result Value   Glucose, Bld 105 (*)    All other components within normal limits  SALICYLATE LEVEL - Abnormal; Notable for the following components:   Salicylate Lvl <7.0 (*)    All other components within normal limits  ACETAMINOPHEN LEVEL  - Abnormal; Notable for the following components:   Acetaminophen (Tylenol), Serum <10 (*)    All other components within normal limits  ETHANOL  CBC  RAPID URINE DRUG SCREEN, HOSP PERFORMED    EKG None  Radiology No results found.  Procedures Procedures    Medications Ordered in ED Medications - No data to display  ED Course/ Medical Decision Making/ A&P   Patient with history of schizophrenia, amphetamine use, prior documentation of malingering here for evaluation of suicidal thoughts.  He is recently discharged from behavioral health earlier today.  He states he is suicidal with plan to overdose on OTC medications.  He feels like his recent medications are not working.  He states he is hearing voices however cannot tell me what they are saying.  Labs personally viewed and interpreted No significant abnormality  Patient medically cleared.  Disposition per psychiatry                               Medical Decision Making Amount and/or Complexity of Data Reviewed External Data Reviewed: labs and notes. Labs: ordered. Decision-making details documented in ED Course.  Risk OTC drugs. Decision regarding hospitalization. Diagnosis or treatment significantly limited by social determinants of health.           Final Clinical Impression(s) / ED Diagnoses Final diagnoses:  Suicidal ideation    Rx / DC Orders ED Discharge Orders     None         Zaneta Lightcap A, PA-C 01/11/23 2138    Wynetta Fines, MD 01/11/23 2332

## 2023-01-11 NOTE — ED Notes (Signed)
Patient observed resting quietly, eyes closed. Respirations equal and unlabored. Will continue to monitor for safety.  

## 2023-01-11 NOTE — ED Notes (Signed)
Pt is currently sleeping, no distress noted, environmental check complete, will continue to monitor patient for safety.  

## 2023-01-11 NOTE — ED Provider Notes (Addendum)
FBC/OBS ASAP Discharge Summary  Date and Time: 01/11/2023 11:02 AM  Name: Ethan Sutton  MRN:  [id number]   Discharge Diagnoses:  Final diagnoses:  Suicidal ideation  Adjustment disorder, unspecified type    Subjective: Patient seen and evaluated face to face by this provider, chart reviewed, and case discussed with Dr. Viviano Simas. On evaluation, patient is lying down in bed in no acute distress. He is alert and oriented x 4. His thought process is linear and speech is clear and coherent. His mood is euthymic and affect is congruent. Patient states that he is feeling "alright". He denies auditory or visual hallucinations. He reports improved sleep and denies nightmares or racing thoughts. He reports a good appetite. 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 However, patient denies recent drug use and states that he has not used in a while. UDS negative on arrival. He denies drinking alcohol recently and states that he last consumed alcohol a week ago. He denies homicidal ideations. I discussed with the patient following up with outpatient services for medication management. Patient then states that he needs to go to the hospital for medication management. He then states that he feels like he wants to hurt himself. I discussed with the patient that per nursing notes, he denied SI/HI/AVH.  Again, I discussed with the patient the next level of care considering he was recently discharged from [facility] on 01/05/23 and the next step would be for him to follow up with outpatient psychiatry for medication management. Patient has an appointment with [name] on December 18th at 10 AM for medication management. I discussed with the patient that he was also seen at [facility] on 01/09/23 and was also recommended to follow up with outpatient  psychiatry for medication management. Per documentation from [facility], they suspected secondary gain as patient denied psychiatric symptoms until it was time for him to discharge. I discussed with the patient contacting his mother to obtain collateral information, patient initially declined for this provider to contact his mother. On my second attempt to speak with the patient regarding treatment recommendations, he gave consent for this provider to contact his mother if he is able to get transportation back home to [place]. I discussed with the patient that I will call his mother to confirm if he is able to return back home and if not, he would be provided with a bus pass and resources for shelter. Patient states that he cannot go back to the shelters in [facility] because he has stayed too many days allowed at the shelter in [facility]. I discussed providing the patient with resources for local shelters in [place]. Patient states, what about my medications? I discussed with the patient providing him with Zyprexa 15 mg x 4 days until he can make it to his follow-up appointment on 01/15/2023 at [facility]. Patient agreeable to stated plan. I contacted the patient's mother twice this morning and left a voicemail. Patient's mother has not returned my phone call. If unable to get in contact with the patient's mother, the patient will be discharged with resources for shelter, supply of sample medications and instructions to follow up with his appointment with [name] scheduled for 01/15/23.  Patient was informed that this provider was able to get in contact with his mother. Patient was asked if  he wanted to contact any family members or friends. Patient declined. Patient was provided with a bus pass and resources for shelters in [place]. Patient denied SI/HI/AVH at the time of discharge. Patient safely discharged.   Stay Summary: Per chart review on 01/10/23, "Ethan Sutton 37 y.o., male patient presented to [facility] as a walk in  accompanied voluntarily by self with complaints of needing medication , has been using alcohol to treat his schizophrenia. Patient expressed desire to OD on OTC medications. He stated he went to store to buy OTC meds and stated that if he leaves here he will overdose.   Patient, on assessment is deemed appropriate for overnight observation unit. Patient mentioned at least 3 times that he wants to O/D on OTC medications. Patient also stated that he needs his "medications for schizophrenia" He was previously discharged from [facility] with Zyprexa 15 mgs and Trazodone 100 mg (HS). These medications will be continued. Patient states that he drinks to deal with his symptoms 7-8 beers/night. Patient does have a history of malingering. Patient is unemployed and recently released from prison (Nov.), and is on parole. Patient states he would like to go back to [facility]. However, at this time, patient is not appropriate for [facility]. Patient states that he can return to his mother's home in [place] if provided transportation."  Update on 01/11/23: Patient was admitted to the [facility] behavioral health continuous assessment unit for overnight observation. Patient was restarted on olanzapine 15 mg p.o. nightly for mood stabilization. Patient tolerated medications without any noticeable side effects. Patient's UDS was negative on arrival. Patient was observed overnight without any reported SI, HI, AVH. There were no signs of acute psychosis. 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 Patient hospitalized at [facility] from 12/27/2022 to 01/05/2023.  Past Medical History: No history reported  Family History:  No history reported  Family Psychiatric History: No history reported  Social History: Patient states that he lives with his mother. However, according to chart review patient was kicked out of his mother's house. Patient is unemployed.  Tobacco Cessation:  A prescription for an FDA-approved tobacco cessation medication was offered at discharge and the patient refused  Current Medications:  Current Facility-Administered Medications  Medication Dose Route Frequency Provider Last Rate Last Admin   acetaminophen (TYLENOL) tablet 650 mg  650 mg Oral Q6H PRN Miller-Almeida, Carlena Hurl, NP   650 mg at 01/10/23 2253   alum & mag hydroxide-simeth (MAALOX/MYLANTA) 200-200-20 MG/5ML suspension 30 mL  30 mL Oral Q4H PRN Miller-Almeida, Carlena Hurl, NP       magnesium hydroxide (MILK OF MAGNESIA) suspension 30 mL  30 mL Oral Daily PRN Miller-Almeida, Carlena Hurl, NP       OLANZapine (ZYPREXA) tablet 15 mg  15 mg Oral QHS Miller-Almeida, Carlena Hurl, NP   15 mg at 01/10/23 2149   traZODone (DESYREL) tablet 100 mg  100 mg Oral QHS PRN Donato Heinz, NP   100 mg at 01/10/23 2149   Current Outpatient Medications  Medication Sig Dispense Refill   traZODone (DESYREL) 100 MG tablet Take 1 tablet (100 mg total) by mouth at bedtime as needed for sleep. 7 tablet 0   OLANZapine (ZYPREXA) 15 MG tablet Take 1 tablet (15 mg  total) by mouth at bedtime. 30 tablet 0    PTA Medications:  Facility Ordered Medications  Medication   acetaminophen (TYLENOL) tablet 650 mg   alum & mag hydroxide-simeth (MAALOX/MYLANTA) 200-200-20 MG/5ML suspension 30 mL   magnesium hydroxide (MILK OF MAGNESIA) suspension 30 mL   OLANZapine (ZYPREXA) tablet 15 mg   traZODone (DESYREL) tablet 100 mg   PTA Medications  Medication Sig   traZODone (DESYREL) 100 MG tablet Take 1 tablet (100 mg total) by mouth at bedtime as needed for sleep.   OLANZapine (ZYPREXA) 15 MG tablet Take 1 tablet (15 mg total) by mouth at bedtime.       01/10/2023     2:37 PM  Depression screen PHQ 2/9  Decreased Interest 2  Down, Depressed, Hopeless 2  PHQ - 2 Score 4  Altered sleeping 2  Tired, decreased energy 1  Change in appetite 2  Feeling bad or failure about yourself  2  Trouble concentrating 1  Moving slowly or fidgety/restless 0  Suicidal thoughts 2  PHQ-9 Score 14  Difficult doing work/chores Somewhat difficult    Flowsheet Row ED from 01/10/2023 in [facility]Dis[facility]) [facility] from 12/26/2022 in [facility] Department [facility]  C-SSRS RISK CATEGORY High Risk No Risk No Risk       Musculoskeletal  Strength & Muscle Tone: within normal limits Gait & Station: normal Patient leans: N/A  Psychiatric Specialty Exam  Presentation  General Appearance:  Appropriate for Environment  Eye Contact: Fair  Speech: Clear and Coherent  Speech Volume: Normal  Handedness: Right   Mood and Affect  Mood: Euthymic  Affect: Congruent   Thought Process  Thought Processes: Coherent  Descriptions of Associations:Intact  Orientation:Full (Time, Place and Person)  Thought Content:Logical  Diagnosis of Schizophrenia or Schizoaffective disorder in past: No    Hallucinations:Hallucinations: None  Ideas of Reference:None  Suicidal Thoughts:Passive  Homicidal Thoughts:Homicidal Thoughts: No   Sensorium  Memory: Immediate Fair; Recent Fair; Remote Fair  Judgment: Intact  Insight: Present   Executive Functions  Concentration: Fair  Attention Span: Fair  Recall: [employer] of Knowledge: Fair  Language: Fair   Psychomotor Activity  Psychomotor Activity: Psychomotor Activity: Normal   Assets  Assets: Communication Skills; Desire for Improvement; Leisure Time; Physical Health   Sleep  Sleep: Fair  Nutritional Assessment (For OBS and FBC admissions only) Has the  patient had a weight loss or gain of 10 pounds or more in the last 3 months?: No Has the patient had a decrease in food intake/or appetite?: No Does the patient have dental problems?: No Does the patient have eating habits or behaviors that may be indicators of an eating disorder including binging or inducing vomiting?: No Has the patient recently lost weight without trying?: 0 Has the patient been eating poorly because of a decreased appetite?: 0 Malnutrition Screening Tool Score: 0    Physical Exam  Physical Exam HENT:     Nose: Nose normal.  Eyes:     Conjunctiva/sclera: Conjunctivae normal.  Cardiovascular:     Rate and Rhythm: Normal rate.  Pulmonary:     Effort: Pulmonary effort is normal.  Musculoskeletal:        General: Normal range of motion.     Cervical back: Normal range of motion.  Neurological:     Mental Status: He is alert and oriented to person, place, and time.  Review of Systems  Constitutional: Negative.   HENT: Negative.    Eyes: Negative.   Respiratory: Negative.    Cardiovascular: Negative.   Gastrointestinal: Negative.   Genitourinary: Negative.   Musculoskeletal: Negative.   Neurological: Negative.   Endo/Heme/Allergies: Negative.    Blood pressure 113/79, pulse 61, temperature 97.8 F (36.6 C), temperature source Oral, resp. rate 18, SpO2 100%. There is no height or weight on file to calculate BMI.  Demographic Factors:  Male, Low socioeconomic status, and Unemployed  Loss Factors: Legal issues and Financial problems/change in socioeconomic status  Historical Factors: NA  Risk Reduction Factors:   Sense of responsibility to family  Continued Clinical Symptoms:  Previous Psychiatric Diagnoses and Treatments  Cognitive Features That Contribute To Risk:  None    Suicide Risk:  Minimal: No identifiable suicidal ideation.  Patients presenting with no risk factors but with morbid ruminations; may be classified as minimal risk based  on the severity of the depressive symptoms  Plan Of Care/Follow-up recommendations:  Upon detailed history, the patient has various encounters with similar presentations and lack of compliance with psychiatric evaluation, treatment regimen and follow up care. Patient's suicidality is conditioned on obtaining housing/shelter and the patient appears to have secondary gain due to homelessness. Patient endorses suicidal ideations to obtain housing and upon achieving placement the patient no longer endorses suicidal ideations. Patient does not meet criteria for inpatient psychiatric treatment or [facility]. This provider educated the patient on the importance of following up with outpatient psychiatry for medication management. If symptoms worsen, patient may return to the [facility], or nearest ED. Patient stable to discharge.   Medications: Patient is to take medications as prescribed. The patient or patient's guardian is to contact a medical professional and/or outpatient provider to address any new side effects that develop. The patient or the patient's guardian should update outpatient providers of any new medications and/or medication changes.   Will order sample medications for olanzapine 15 mg po at bedtime x 4 doses.  Outpatient Follow up: Follow-Ups: Go to [name] on 01/15/2023; Your follow up appointment is on 01/15/23 at 10:00 am. This appointment will be held in person. Following this appointment, you will be scheduled for a clinical assessment to obtain necessary therapy and medication management services.  [name] [address] [place] [address] [phone]  Safety:   The following safety precautions should be taken:   No sharp objects. This includes scissors, razors, scrapers, and putty knives.   Chemicals should be removed and locked up.   Medications should be removed and locked up.   Weapons should be removed and locked up. This includes firearms, knives and  instruments that can be used to cause injury.   The patient should abstain from use of illicit substances/drugs and abuse of any medications.  If symptoms worsen or do not continue to improve or if the patient becomes actively suicidal or homicidal then it is recommended that the patient return to the closest hospital emergency department, the [facility], or call 911 for further evaluation and treatment. National Suicide Prevention Lifeline 1-800-SUICIDE or 931-394-5416.  About 988 988 offers 24/7 access to trained crisis counselors who can help people experiencing mental health-related distress. People can call or text 988 or chat 988lifeline.org for themselves or if they are worried about a loved one who may need crisis support.  Disposition: Discharge.   Brittane Grudzinski L, NP 01/11/2023, 11:02 AM

## 2023-01-11 NOTE — ED Notes (Signed)
Pt belongings placed under locker number 14. Blue book bag and 2 belongings bags.

## 2023-01-11 NOTE — Discharge Instructions (Addendum)
Discharge recommendations:   Medications: Patient is to take medications as prescribed. The patient or patient's guardian is to contact a medical professional and/or outpatient provider to address any new side effects that develop. The patient or the patient's guardian should update outpatient providers of any new medications and/or medication changes.   Outpatient Follow up: Follow-Ups: Go to [name] on 01/15/2023; Your follow up appointment is on 01/15/23 at 10:00 am. This appointment will be held in person. Following this appointment, you will be scheduled for a clinical assessment to obtain necessary therapy and medication management services.  [name] [address] [place] [address] [phone]  Safety:   The following safety precautions should be taken:   No sharp objects. This includes scissors, razors, scrapers, and putty knives.   Chemicals should be removed and locked up.   Medications should be removed and locked up.   Weapons should be removed and locked up. This includes firearms, knives and instruments that can be used to cause injury.   The patient should abstain from use of illicit substances/drugs and abuse of any medications.  If symptoms worsen or do not continue to improve or if the patient becomes actively suicidal or homicidal then it is recommended that the patient return to the closest hospital emergency department, the [facility], or call 911 for further evaluation and treatment. National Suicide Prevention Lifeline 1-800-SUICIDE or 346-143-7419.  About 988 988 offers 24/7 access to trained crisis counselors who can help people experiencing mental health-related distress. People can call or text 988 or chat 988lifeline.org for themselves or if they are worried about a loved one who may need crisis support.

## 2023-01-11 NOTE — BH Assessment (Addendum)
Comprehensive Clinical Assessment (CCA) Note   01/11/2023 Ethan Sutton [id number]  Disposition: Ethan Asper, NP recommends continuous observation and pt is to be seen by psychiatry in AM.    Chief Complaint:  Chief Complaint  Patient presents with   Psychiatric Evaluation   Visit Diagnosis:   Schizophrenia Disorder     CCA Screening, Triage and Referral (STR)  Patient Reported Information How did you hear about [name]? -- [facility] ED)  What Is the Reason for Your Visit/Call Today? Per EDP's note : "This patient has bounced around between [name], [facility] and now here claiming suicidal and hearing voices.  Each facility has told him to he needs to follow up with [name] which he has an appointment dec 18.  Patient states "I'm not feeling well I have already bought OTC meds and I'm hearing voices".  When asked what they are saying he just keeps stating "like like like....".  [facility] gave him 4 days worth of zyprexa and he reports it aint workin.  Patient was just discharged today from [name].  "  How Long Has This Been Causing You Problems? > than 6 months  What Do You Feel Would Help You the Most Today? Treatment for Depression or other mood problem; Medication(s)   Have You Recently Had Any Thoughts About Hurting Yourself? Yes  Are You Planning to Commit Suicide/Harm Yourself At This time? No   Flowsheet Row ED from 01/11/2023 in [facility] Department at [facility] ED from 01/10/2023 in [facility] Admission (Discharged) [facility]  C-SSRS RISK CATEGORY High Risk High Risk No Risk       Have you Recently Had Thoughts About Hurting Someone [name]? No  Are You Planning to Harm Someone at This Time? No  Explanation: Denies HI   Have You Used Any Alcohol or Drugs in the Past 24 Hours? No  What Did You Use and How Much? N/A  Do You Currently Have a Therapist/Psychiatrist? NO Name  of Therapist/Psychiatrist: Pt denies outpatient services.  Have You Been Recently Discharged From Any Office Practice or Programs? No  Explanation of Discharge From Practice/Program: N/A     CCA Screening Triage Referral Assessment Type of Contact: Tele-Assessment  Telemedicine Service Delivery: Telemedicine service delivery: This service was provided via telemedicine using a 2-way, interactive audio and video technology  Is this Initial or Reassessment? Is this Initial or Reassessment?: Initial Assessment  Date Telepsych consult ordered in [facility]:  Date Telepsych consult ordered in [facility]: 01/11/23  Time Telepsych consult ordered in [facility]:  Time Telepsych consult ordered in [facility]: 1727  Location of Assessment: [facility] ED  Provider Location: [facility] Assessment Services   Collateral Involvement: None noted   Does Patient Have a Automotive engineer Guardian? No  Legal Guardian Contact Information: N/A  Copy of Legal Guardianship Form: -- (N/A)  Legal Guardian Notified of Arrival: -- (N/A)  Legal Guardian Notified of Pending Discharge: -- (N/A)  If Minor and Not Living with Parent(s), Who has Custody? N/A  Is CPS involved or ever been involved? Never  Is APS involved or ever been involved? Never   Patient Determined To Be At Risk for Harm To Self or Others Based on Review of Patient Reported Information or Presenting Complaint? Yes, for Self-Harm  Method: No Plan  Availability of Means: No access or NA  Intent: Vague intent or NA  Notification Required: No need or identified person  Additional  Information for Danger to Others Potential: -- (NA)  Additional Comments for Danger to Others Potential: None noted  Are There Guns or Other Weapons in Your Home? No  Types of Guns/Weapons: NA  Are These Weapons Safely Secured?                            -- (NA)  Who Could Verify You Are Able To Have These Secured: NA  Do You Have any Outstanding Charges, Pending Court Dates,  Parole/Probation? Pt denies pending legal charges  Contacted To Inform of Risk of Harm To Self or Others: -- (n/a)    Does Patient Present under Involuntary Commitment? No    [place] of Residence: Ethan Sutton   Patient Currently Receiving the Following Services: Medication Management   Determination of Need: Routine (7 days)   Options For Referral: Outpatient Therapy; Medication Management     CCA Biopsychosocial Patient Reported Schizophrenia/Schizoaffective Diagnosis in Past: Yes   Strengths: Pt is willing to participate in treatment   Mental Health Symptoms Depression:  Difficulty Concentrating; Change in energy/activity   Duration of Depressive symptoms: Duration of Depressive Symptoms: Greater than two weeks   Mania:  None   Anxiety:   N/A   Psychosis:  None   Duration of Psychotic symptoms:    Trauma:  N/A   Obsessions:  N/A   Compulsions:  N/A   Inattention:  N/A   Hyperactivity/Impulsivity:  N/A   Oppositional/Defiant Behaviors:  N/A   Emotional Irregularity:  N/A   Other Mood/Personality Symptoms:  None noted    Mental Status Exam Appearance and self-care  Stature:  Average   Weight:  Average weight   Clothing:  Casual   Grooming:  Normal   Cosmetic use:  None   Posture/gait:  Normal   Motor activity:  Not Remarkable   Sensorium  Attention:  Normal   Concentration:  Normal   Orientation:  X5   Recall/memory:  Normal   Affect and Mood  Affect:  Appropriate   Mood:  Anxious   Relating  Eye contact:  Fleeting   Facial expression:  Responsive   Attitude toward examiner:  Cooperative   Thought and Language  Speech flow: Clear and Coherent   Thought content:  Appropriate to Mood and Circumstances   Preoccupation:  None   Hallucinations:  None   Organization:  Intact; Goal-directed   Company secretary of Knowledge:  Fair   Intelligence:  Average   Abstraction:  Normal   Judgement:  Fair    Dance movement psychotherapist:  Realistic   Insight:  Fair   Decision Making:  Normal   Social Functioning  Social Maturity:  Responsible   Social Judgement:  Normal   Stress  Stressors:  Housing   Coping Ability:  Exhausted; Overwhelmed   Skill Deficits:  None   Supports:  Family     Religion: Religion/Spirituality Are You A Religious Person?: No How Might This Affect Treatment?: NA  Leisure/Recreation: Leisure / Recreation Do You Have Hobbies?: No  Exercise/Diet: Exercise/Diet Do You Exercise?: No Have You Gained or Lost A Significant Amount of Weight in the Past Six Months?: No Do You Follow a Special Diet?: No Do You Have Any Trouble Sleeping?: No   CCA Employment/Education Employment/Work Situation: Employment / Work Situation Employment Situation: Unemployed Patient's Job has Been Impacted by Current Illness: No Has Patient ever Been in Equities trader?: No  Education: Education Is Patient Currently Attending School?:  No Last Grade Completed: 12 Did You Attend College?: No Did You Have An Individualized Education Program (IIEP): No Did You Have Any Difficulty At School?: No Patient's Education Has Been Impacted by Current Illness: No   CCA Family/Childhood History Family and Relationship History: Family history Marital status: Single Does patient have children?: No  Childhood History:  Childhood History By whom was/is the patient raised?: Mother Description of patient's current relationship with siblings: NA Did patient suffer any verbal/emotional/physical/sexual abuse as a child?: No Has patient ever been sexually abused/assaulted/raped as an adolescent or adult?: No Witnessed domestic violence?: No Has patient been affected by domestic violence as an adult?: No       CCA Substance Use Alcohol/Drug Use: Alcohol / Drug Use Pain Medications: See PTA Prescriptions: See PTA Over the Counter: See PTA History of alcohol / drug use?: Yes Longest  period of sobriety (when/how long): Unknown Negative Consequences of Use:  (NA) Withdrawal Symptoms: None Substance #1 Name of Substance 1: ETOH 1 - Age of First Use: unknown 1 - Amount (size/oz): 15 pack of beer 1 - Frequency: daily 1 - Duration: unknown 1 - Last Use / Amount: 2-3 days ago 1 - Method of Aquiring: UTA 1- Route of Use: UTA                       ASAM's:  Six Dimensions of Multidimensional Assessment  Dimension 1:  Acute Intoxication and/or Withdrawal Potential:   Dimension 1:  Description of individual's past and current experiences of substance use and withdrawal: Fully functioning  Dimension 2:  Biomedical Conditions and Complications:   Dimension 2:  Description of patient's biomedical conditions and  complications: Fully functioning  Dimension 3:  Emotional, Behavioral, or Cognitive Conditions and Complications:     Dimension 4:  Readiness to Change:  Dimension 4:  Description of Readiness to Change criteria: Willing to engage in treatment  Dimension 5:  Relapse, Continued use, or Continued Problem Potential:     Dimension 6:  Recovery/Living Environment:  Dimension 6:  Recovery/Iiving environment criteria description: Has a supportive environment  ASAM Severity Score: ASAM's Severity Rating Score: 1  ASAM Recommended Level of Treatment: ASAM Recommended Level of Treatment: Level II Intensive Outpatient Treatment   Substance use Disorder (SUD) Substance Use Disorder (SUD)  Checklist Symptoms of Substance Use: Continued use despite having a persistent/recurrent physical/psychological problem caused/exacerbated by use, Continued use despite persistent or recurrent social, interpersonal problems, caused or exacerbated by use  Recommendations for Services/Supports/Treatments: Recommendations for Services/Supports/Treatments Recommendations For Services/Supports/Treatments: Medication Management, Individual Therapy  Discharge Disposition:    DSM5  Diagnoses: Patient Active Problem List   Diagnosis Date Noted   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

## 2023-01-12 DIAGNOSIS — R45851 Suicidal ideations: Secondary | ICD-10-CM

## 2023-01-12 DIAGNOSIS — Z765 Malingerer [conscious simulation]: Secondary | ICD-10-CM

## 2023-01-12 MED ORDER — OLANZAPINE 10 MG PO TABS
15.0000 mg | ORAL_TABLET | Freq: Every day | ORAL | Status: DC
  Administered 2023-01-12: 15 mg via ORAL
  Filled 2023-01-12: qty 1

## 2023-01-12 MED ORDER — ACETAMINOPHEN 325 MG PO TABS
325.0000 mg | ORAL_TABLET | Freq: Once | ORAL | Status: AC
  Administered 2023-01-12: 325 mg via ORAL
  Filled 2023-01-12: qty 1

## 2023-01-12 MED ORDER — TRAZODONE HCL 100 MG PO TABS
100.0000 mg | ORAL_TABLET | Freq: Every evening | ORAL | Status: DC | PRN
  Administered 2023-01-12: 100 mg via ORAL
  Filled 2023-01-12: qty 1

## 2023-01-12 MED ORDER — ONDANSETRON 4 MG PO TBDP
4.0000 mg | ORAL_TABLET | Freq: Once | ORAL | Status: AC
  Administered 2023-01-12: 4 mg via ORAL
  Filled 2023-01-12: qty 1

## 2023-01-12 NOTE — ED Provider Notes (Signed)
Emergency Medicine Observation Re-evaluation Note  Ethan Sutton is a 37 y.o. male, seen on rounds today.  Pt initially presented to the ED for complaints of Psychiatric Evaluation Currently, the patient is resting.  Physical Exam  BP 99/64 (BP Location: Right Arm)   Pulse 64   Temp 97.7 F (36.5 C) (Oral)   Resp 16   Ht 5\' 6"  (1.676 m)   Wt 68 kg   SpO2 98%   BMI 24.21 kg/m  Physical Exam General: NAD Cardiac: well perfused Lungs: even and unlabored Psych: no agitation  ED Course / MDM  EKG:   I have reviewed the labs performed to date as well as medications administered while in observation.  Recent changes in the last 24 hours include seen by TTS, needs re-eval by psychiatry this am.  Plan  Current plan is for re-eval by psychiatry this am.    Ernie Avena, MD 01/12/23 1200

## 2023-01-12 NOTE — Progress Notes (Signed)
Writer spoke with patient nurse April who stated pt was a sleep at this time.  Will try again later today

## 2023-01-12 NOTE — ED Notes (Signed)
Pt currently taking shower

## 2023-01-12 NOTE — ED Notes (Signed)
Pt shared with this nurse that his parole officer Orson Aloe could be called for collateral information. That number is [phone] or [phone]

## 2023-01-12 NOTE — ED Notes (Signed)
VOL at this time

## 2023-01-12 NOTE — ED Notes (Addendum)
spoke with patient,  who is still c/o of auditory hallucination "voice telling him to OD on medications,  I'm not sure if patient is homeless.  I tried reaching patient mother but no one answers the phone.  pt does appear to be seeking secondary gain.  seem to be malingering.   will need to get more collateral from pt mother,  okay with patient staying overnight and re-assess in the AM. York Pellant NP)

## 2023-01-12 NOTE — Consult Note (Signed)
Telepsych Consultation   Reason for Consult:  Suicidal ideation Referring Physician:  Britni Henderly PA-C Location of Patient: [facility] Location of Provider: Other: remote  Patient Identification: Ethan Sutton MRN:  [id number] Principal Diagnosis: <principal problem not specified> Diagnosis:  Active Problems:   * No active hospital problems. *    Total Time spent with patient: 20 minutes  Subjective:   Ethan Sutton is a 37 y.o. male patient admitted with SI.  HPI:  Ethan Sutton 37 y/o male with a history of suicidal ideation, insomnia, anxiety, schizophrenia and acute psychosis.  Patient was seen via telemedicine for psych consultation due to suicidal ideation.  This is writers second attempt to evaluate patient,  first attempt was earlier this morning but pt was a sleep.  Patient observed in his room laying in bed patient was able to sit up and answer questions appropriately.  Patient is alert and oriented x 4, speech is clear, maintained eye contact.  When pt was asked why he is here in the hospital patient stated because he keep hearing voices to OD on his medications.    Patient also stated he has difficulty sleeping and that his medicine is not working.  A review of patient records show that patient was recently discharged and given instructions for outpatient services however patient is now back in the hospital.  Per the patient he believes that he needs inpatient because he can get back on his medicine better that way.  When asked if he is homeless patient state no ,  he reported that he stays with his mother in [place].  Per the patient he does not have a ride to get to [name] which was the original resources provided to him to follow up with when he was discharged 2 days ago.  Writer did reach out to mom to get some collateral however the pt mother didn't  answer the phone. According to patient he drinks alcohol every other day, he denies any illicit drug use at  this time.  According to patient he was recently released from prison a few days ago. Currently unemployed.  Per the patient his medication is no longer working and he has been taking them for years.   Writer did collaborate with Dr. Per Dr Viviano Simas she is okay with patient staying a extra day and re-evaluated tomorrow.  Recommend overnight observation and reassess on 01/13/23 when we can get more collateral is from patient's mother.    Past Psychiatric History: Schizophrenia, suicidal ideation, insomnia.  Risk to Self: Yes  Risk to Others: No  Prior Inpatient Therapy: Unknown  Prior Outpatient Therapy:  Unknown  Past Medical History:  Past Medical History:  Diagnosis Date   Schizophrenia (HCC)     Past Surgical History:  Procedure Laterality Date   NO PAST SURGERIES     Family History:  History reviewed. No pertinent family history. Family Psychiatric  History: Unknown Social History:  Social History   Substance and Sexual Activity  Alcohol Use Not Currently   Comment: ive been in prison for a year i haven't used any     Social History   Substance and Sexual Activity  Drug Use Not Currently    Social History   Socioeconomic History   Marital status: Single    Spouse name: Not on file   Number of children: Not on file   Years of education: Not on file   Highest education level: Not on file  Occupational History   Not  on file  Tobacco Use   Smoking status: Former    Current packs/day: 0.00    Types: Cigarettes    Quit date: 2019    Years since quitting: 5.9   Smokeless tobacco: Never  Vaping Use   Vaping status: Never Used  Substance and Sexual Activity   Alcohol use: Not Currently    Comment: ive been in prison for a year i haven't used any   Drug use: Not Currently   Sexual activity: Not Currently    Birth control/protection: Condom, Abstinence  Other Topics Concern   Not on file  Social History Narrative   Not on file   Social Drivers of Health    Financial Resource Strain: Not on file  Food Insecurity: No Food Insecurity (12/27/2022)   Hunger Vital Sign    Worried About Running Out of Food in the Last Year: Never true    Ran Out of Food in the Last Year: Never true  Transportation Needs: No Transportation Needs (12/27/2022)   PRAPARE - Administrator, Civil Service (Medical): No    Lack of Transportation (Non-Medical): No  Physical Activity: Not on file  Stress: Not on file  Social Connections: Not on file   Additional Social History:    Allergies:  No Known Allergies  Labs:  Results for orders placed or performed during the hospital encounter of 01/11/23 (from the past 48 hours)  Rapid urine drug screen (hospital performed)     Status: None   Collection Time: 01/11/23  3:49 PM  Result Value Ref Range   Opiates NONE DETECTED NONE DETECTED   Cocaine NONE DETECTED NONE DETECTED   Benzodiazepines NONE DETECTED NONE DETECTED   Amphetamines NONE DETECTED NONE DETECTED   Tetrahydrocannabinol NONE DETECTED NONE DETECTED   Barbiturates NONE DETECTED NONE DETECTED    Comment: (NOTE) DRUG SCREEN FOR MEDICAL PURPOSES ONLY.  IF CONFIRMATION IS NEEDED FOR ANY PURPOSE, NOTIFY LAB WITHIN 5 DAYS.  LOWEST DETECTABLE LIMITS FOR URINE DRUG SCREEN Drug Class                     Cutoff (ng/mL) Amphetamine and metabolites    1000 Barbiturate and metabolites    200 Benzodiazepine                 200 Opiates and metabolites        300 Cocaine and metabolites        300 THC                            50 Performed at [facility], [address] [place], [place] [address]   Comprehensive metabolic panel     Status: Abnormal   Collection Time: 01/11/23  3:55 PM  Result Value Ref Range   Sodium 138 135 - 145 mmol/L   Potassium 3.8 3.5 - 5.1 mmol/L   Chloride 105 98 - 111 mmol/L   CO2 24 22 - 32 mmol/L   Glucose, Bld 105 (H) 70 - 99 mg/dL    Comment: Glucose reference range applies only to samples taken after  fasting for at least 8 hours.   BUN 11 6 - 20 mg/dL   Creatinine, Ser 1.60 0.61 - 1.24 mg/dL   Calcium 9.0 8.9 - 73.7 mg/dL   Total Protein 7.2 6.5 - 8.1 g/dL   Albumin 3.8 3.5 - 5.0 g/dL   AST 22 15 - 41 U/L  ALT 41 0 - 44 U/L   Alkaline Phosphatase 71 38 - 126 U/L   Total Bilirubin 0.9 <1.2 mg/dL   GFR, Estimated >65 >78 mL/min    Comment: (NOTE) Calculated using the CKD-EPI Creatinine Equation (2021)    Anion gap 9 5 - 15    Comment: Performed at [facility], [address], [place], [place] [address]  Ethanol     Status: None   Collection Time: 01/11/23  3:55 PM  Result Value Ref Range   Alcohol, Ethyl (B) <10 <10 mg/dL    Comment: (NOTE) Lowest detectable limit for serum alcohol is 10 mg/dL.  For medical purposes only. Performed at [facility], [address] [place], [place] [address]   Salicylate level     Status: Abnormal   Collection Time: 01/11/23  3:55 PM  Result Value Ref Range   Salicylate Lvl <7.0 (L) 7.0 - 30.0 mg/dL    Comment: Performed at [facility], [address], [place], [place] [address]  Acetaminophen level     Status: Abnormal   Collection Time: 01/11/23  3:55 PM  Result Value Ref Range   Acetaminophen (Tylenol), Serum <10 (L) 10 - 30 ug/mL    Comment: (NOTE) Therapeutic concentrations vary significantly. A range of 10-30 ug/mL  may be an effective concentration for many patients. However, some  are best treated at concentrations outside of this range. Acetaminophen concentrations >150 ug/mL at 4 hours after ingestion  and >50 ug/mL at 12 hours after ingestion are often associated with  toxic reactions.  Performed at [facility], [address] [place], [place] [address]   cbc     Status: None   Collection Time: 01/11/23  3:55 PM  Result Value Ref Range   WBC 7.8 4.0 - 10.5 K/uL   RBC 4.49 4.22 - 5.81 MIL/uL   Hemoglobin 13.8 13.0 - 17.0 g/dL   HCT 25.3 66.4 - 40.3 %   MCV 94.4 80.0 - 100.0 fL   MCH 30.7  26.0 - 34.0 pg   MCHC 32.5 30.0 - 36.0 g/dL   RDW 47.4 25.9 - 56.3 %   Platelets 260 150 - 400 K/uL   nRBC 0.0 0.0 - 0.2 %    Comment: Performed at [facility], [address], [place], [place] [address]    Medications:  No current facility-administered medications for this encounter.   Current Outpatient Medications  Medication Sig Dispense Refill   ibuprofen (ADVIL) 200 MG tablet Take 200 mg by mouth as needed for headache or mild pain (pain score 1-3).     OLANZapine (ZYPREXA) 15 MG tablet Take 1 tablet (15 mg total) by mouth at bedtime. 30 tablet 0   traZODone (DESYREL) 100 MG tablet Take 1 tablet (100 mg total) by mouth at bedtime as needed for sleep. 7 tablet 0    Musculoskeletal: Strength & Muscle Tone: within normal limits Gait & Station: normal Patient leans: N/A          Psychiatric Specialty Exam:  Presentation  General Appearance:  Appropriate for Environment   Eye Contact: Fair   Speech: Clear and Coherent   Speech Volume: Normal   Handedness: Right    Mood and Affect  Mood: Euthymic   Affect: Congruent    Thought Process  Thought Processes: Coherent   Descriptions of Associations:Intact   Orientation:Full (Time, Place and Person)   Thought Content:Logical   History of Schizophrenia/Schizoaffective disorder:Yes  Duration  of Psychotic Symptoms:No data recorded Hallucinations:Hallucinations: None   Ideas of Reference:None   Suicidal Thoughts:Suicidal Thoughts: Yes, Passive   Homicidal Thoughts:Homicidal Thoughts: No    Sensorium  Memory: Immediate Fair; Recent Fair; Remote Fair   Judgment: Intact   Insight: Present    Executive Functions  Concentration: Fair   Attention Span: Fair   Recall: [employer] of Knowledge: Fair   Language: Fair    Psychomotor Activity  Psychomotor Activity: Psychomotor Activity: Normal    Assets  Assets: Communication Skills; Desire  for Improvement; Leisure Time; Physical Health    Sleep  Sleep: Sleep: Fair     Physical Exam: Physical Exam HENT:     Head: Normocephalic.     Nose: Nose normal.  Eyes:     Pupils: Pupils are equal, round, and reactive to light.  Cardiovascular:     Rate and Rhythm: Normal rate.  Pulmonary:     Effort: Pulmonary effort is normal.  Musculoskeletal:        General: Normal range of motion.     Cervical back: Normal range of motion.  Neurological:     General: No focal deficit present.     Mental Status: He is alert.  Psychiatric:        Mood and Affect: Mood normal.        Behavior: Behavior normal.        Thought Content: Thought content normal.        Judgment: Judgment normal.    Review of Systems  Constitutional: Negative.   HENT: Negative.    Eyes: Negative.   Respiratory: Negative.    Cardiovascular: Negative.   Gastrointestinal: Negative.   Genitourinary: Negative.   Musculoskeletal: Negative.   Skin: Negative.   Neurological: Negative.   Psychiatric/Behavioral:  Positive for suicidal ideas. The patient is nervous/anxious and has insomnia.    Blood pressure 103/60, pulse 74, temperature 98.2 F (36.8 C), temperature source Oral, resp. rate 18, height 5\' 6"  (1.676 m), weight 68 kg, SpO2 100%. Body mass index is 24.21 kg/m.  Treatment Plan Summary: Plan pt to stay overnight and reassess in the AM 01/13/23  Disposition:  to stay overnight and reassess 01/13/23  This service was provided via telemedicine using a 2-way, interactive audio and video technology.  Names of all persons participating in this telemedicine service and their role in this encounter. Name: Ethan Sutton Role: Patient  Name: Sindy Guadeloupe  Role: NP  Name: April Smith Role: RN  Name:  Role:     Sindy Guadeloupe, NP 01/12/2023 2:12 PM

## 2023-01-13 NOTE — ED Notes (Signed)
Out with RN to safe transport, steady gait, denies questions or needs, has all belongings

## 2023-01-13 NOTE — ED Provider Notes (Signed)
Emergency Medicine Observation Re-evaluation Note  Ethan Sutton is a 37 y.o. male, seen on rounds today.  Pt initially presented to the ED for complaints of Psychiatric Evaluation Currently, the patient is resting.  Physical Exam  BP 102/66 (BP Location: Right Arm)   Pulse (!) 59   Temp 98.2 F (36.8 C) (Oral)   Resp 18   Ht 5\' 6"  (1.676 m)   Wt 68 kg   SpO2 99%   BMI 24.21 kg/m  Physical Exam General: nad Lungs: no distress Psych: calm  ED Course / MDM  EKG:   I have reviewed the labs performed to date as well as medications administered while in observation.  Recent changes in the last 24 hours include intermittent hallucinations, reporting thoughts of self-harm, difficulty obtaining collateral.  Plan  Current plan is for repeat psychiatry assessment    Sloan Leiter, DO 01/13/23 0820

## 2023-01-13 NOTE — ED Notes (Signed)
Pending arrival of Safe Transport

## 2023-01-13 NOTE — ED Notes (Addendum)
Attempted phone call

## 2023-01-13 NOTE — ED Notes (Signed)
[facility] waiver signed for safe transport home

## 2023-01-13 NOTE — ED Notes (Signed)
Waiting for Safe transport, resting, watching TV, given drink per request, dressed and ready to go, sitter present.

## 2023-01-13 NOTE — Discharge Instructions (Addendum)
Please follow up with your appointment on 01/15/23 at [facility] for mental health treatment.    Behavioral Health Resources:  Intensive Outpatient Programs: [facility]      [address] [place], [place] [phone] Both a day and evening program       [facility]     [address]        [place], [place] [address] [phone]         ADS: [facility] [address] [place] [phone]  [facility] Mental Health ACCESS LINE: [phone] or [phone] [address] [place], [place] [address] [email or web address]   Substance Abuse Resources: [facility]  [phone] [facility] [phone] [facility] [place] [phone] Floydene Flock [phone] Residential & Outpatient Substance Abuse Program  [phone]  Psychological Services: [facility]  [phone] [facility]  [phone] [facility], [phone] [place]. [address] [place], ACCESS LINE: [phone] or [phone], [email or web address]  Mobile Crisis Teams:                                        [facility]         Mobile Crisis Care Unit [phone]             [facility] [address] Ginette Otto [phone]                                         Interventionist [address] [place] [place] [phone]  Self-Help/Support Groups: [facility] of [employer] of support groups [phone] (call for more info)  Narcotics Anonymous (NA) [facility] [address] [place] - 2 meetings at this location  Residential Treatment Programs:  [facility]      [address]        [place]       [phone]         [facility] [address], [place] [address] [place], [place]   [address] [phone]  [facility]  [address] [place] [address] [place], [place] [address] [phone] Admissions: 8am-3pm M-F  [facility]     [address]        [place], [place] [address]       [phone]         [facility] [address] [place], [place] [phone]  [facility] [address] [place], [place] [phone]  [facility] - Intensive Outpatient      [address]     [place], [place]       [phone]         [facility] ([facility]     [address] [place], [place] [phone] or [phone]  [facility] ([facility]), Medicaid [address] [place], [place] [phone]  [facility] [address]                                               [address] [place] [phone]  [place] [facility] Resources: Therapist, music-  [phone]               General Therapy                                                Angie Fava, PhD        [address] [place], [place] [address]         [phone]   Insurance  [facility]   [address] [place], [place] [address] [phone]  [facility] [address] [place], [place] [address] [phone] Insurance/Medicaid/sponsorship through [facility]                                              [address]                                        [place], [place] [address]    Therapy/tele-psych/case         [phone]          [facility] [address], [place]  [address]  Adolescent/group home/case management [phone]                                           Creola Corn PhD       General therapy       Insurance   [phone]         Dr. Lolly Mustache, Insurance, M-F [phone]  [facility]  [employer] [facility] Dept. [address]                 [address]         [address] [place] [address]  Blondell Reveal Phone:  [phone]                                  Phone:  [phone]                   Phone:  [phone]  [facility], [phone] [phone]       -     [facility] in [place], [address]  [phone], Insurance      It was a pleasure caring for you today in the emergency department.  Please return to the emergency department for any worsening or worrisome symptoms.

## 2023-01-13 NOTE — ED Notes (Addendum)
BH/MH NP at [facility]

## 2023-01-13 NOTE — Consult Note (Signed)
[place] Psychiatric Consult Follow-up  Patient Name: .Ethan Sutton  MRN: [id number]  DOB: 14-Nov-1985  Consult Order details:  Orders (From admission, onward)     Start     Ordered   01/11/23 1727  CONSULT TO CALL ACT TEAM       Ordering Provider: Linwood Dibbles, PA-C  Provider:  (Not yet assigned)  Question:  Reason for Consult?  Answer:  SI, hallucinations   01/11/23 1726             Mode of Visit: In person    Psychiatry Consult Evaluation  Service Date: January 13, 2023 LOS:  LOS: 0 days  Chief Complaint suicidal  Primary Psychiatric Diagnoses  Suicidal ideations 2.  malingering 3.    Assessment  Ethan Sutton is a 37 y.o. male admitted: Presented to the EDfor 01/11/2023  3:39 PM for suicidal ideations. He carries the psychiatric diagnoses of MDD and malingering.  Pt denies SI/HI/AVH and does feel safe to discharge today. Pt does not meet criteria for inpatient treatment at this time.   Diagnoses:  Active Hospital problems: Principal Problem:   Suicidal ideation    Plan   ## Psychiatric Medication Recommendations:  -Zyprexa 15 mg at bedtime   ## Medical Decision Making Capacity: Not specifically addressed in this encounter  ## Further Work-up:  -- Pertinent labwork reviewed earlier this admission includes: UDS, CMP, CBC, EKG   ## Disposition:-- There are no psychiatric contraindications to discharge at this time  ## Behavioral / Environmental: - No specific recommendations at this time.     ## Safety and Observation Level:  - Based on my clinical evaluation, I estimate the patient to be at low risk of self harm in the current setting. - At this time, we recommend  routine. This decision is based on my review of the chart including patient's history and current presentation, interview of the patient, mental status examination, and consideration of suicide risk including evaluating suicidal ideation, plan, intent, suicidal or self-harm  behaviors, risk factors, and protective factors. This judgment is based on our ability to directly address suicide risk, implement suicide prevention strategies, and develop a safety plan while the patient is in the clinical setting. Please contact our team if there is a concern that risk level has changed.  CSSR Risk Category:C-SSRS RISK CATEGORY: High Risk  Suicide Risk Assessment: Patient has following modifiable risk factors for suicide: medication noncompliance, which we are addressing by refill of medications. Patient has following non-modifiable or demographic risk factors for suicide: male gender Patient has the following protective factors against suicide: Access to outpatient mental health care, Supportive family, Supportive friends, and Cultural, spiritual, or religious beliefs that discourage suicide  Thank you for this consult request. Recommendations have been communicated to the primary team.  We will recommend discharge at this time.   [name], NP       History of Present Illness  Relevant Aspects of Hospital ED Course:  Admitted on 01/11/2023 for suicidal ideations. Pt recently discharged from [facility], has been seen in ED or UC numerous times, and has outpatient follow up with [name] on Wednesday, December 18th. Pt does have hx of malingering and high utilizer of hospital services. Pt recommended for overnight reeval.  Patient Report:  Today, patient denies SI/HI/AVH. He does feel safe to discharge home today. He lives with his mother. He reports having zyprexa prescription at home and does not need refill at this time. Pt aware he has follow up  with [name] on Wednesday for med management and therapy. He has no safety concerns or questions at this time. Pt requesting bus pass.   Psych ROS:  Depression: yes Anxiety:  yes Mania (lifetime and current): denies Psychosis: (lifetime and current): denies  Collateral information:  Contacted his mother, however she did not answer.    Review of Systems  All other systems reviewed and are negative.    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 Living Situation: lives with mother Spiritual Hx: religious Access to weapons/lethal means: denies  Substance History Alcohol:  Type of alcohol denies Last Drink na Number of drinks per day na History of alcohol withdrawal seizures na History of DT's na Tobacco: na Illicit drugs: denies Prescription drug abuse: denies Rehab hx: denies  Exam Findings  Physical Exam:  Vital Signs:  Temp:  [98.2 F (36.8 C)] 98.2 F (36.8 C) (12/15 2332) Pulse Rate:  [59-74] 59 (12/15 2332) Resp:  [18] 18 (12/15 2332) BP: (102-103)/(60-66) 102/66 (12/15 2332) SpO2:  [99 %-100 %] 99 % (12/15 2332) Blood pressure 102/66, pulse (!) 59, temperature 98.2 F (36.8 C), temperature source Oral, resp. rate 18, height 5\' 6"  (1.676 m), weight 68 kg, SpO2 99%. Body mass index is 24.21 kg/m.  Physical Exam Neurological:     Mental Status: He is alert and oriented to person, place, and time.     Mental Status Exam: General Appearance: Well Groomed  Orientation:  Full (Time, Place, and Person)  Memory:  Immediate;   Good Recent;   Good  Concentration:  Concentration: Good  Recall:  Good  Attention  Good  Eye Contact:  Good  Speech:  Clear and Coherent and Normal Rate  Language:  Good  Volume:  Normal  Mood: good  Affect:  Appropriate  Thought Process:  Coherent  Thought Content:  WDL  Suicidal Thoughts:  No  Homicidal Thoughts:   No  Judgement:  Fair  Insight:  Fair  Psychomotor Activity:  Normal  Akathisia:  Negative  Fund of Knowledge:  Good      Assets:  Communication Skills Housing Leisure Time Physical Health  Cognition:  WNL  ADL's:  Intact  AIMS (if indicated):        Other History   These have been pulled in through the EMR, reviewed, and updated if appropriate.  Family History:  The patient's family history is not on file.  Medical History: Past Medical History:  Diagnosis Date   Schizophrenia [facility])     Surgical History: Past Surgical History:  Procedure Laterality Date   NO PAST SURGERIES       Medications:   Current Facility-Administered Medications:    OLANZapine (ZYPREXA) tablet 15 mg, 15 mg, Oral, QHS, Young, Travis J, DO, 15 mg at 01/12/23 2246   traZODone (DESYREL) tablet 100 mg, 100 mg, Oral, QHS PRN, Coral Spikes, DO, 100 mg at 01/12/23 2246  Current Outpatient Medications:    ibuprofen (ADVIL) 200 MG tablet, Take 200 mg by mouth as needed for headache or mild pain (pain score 1-3)., Disp: , Rfl:    OLANZapine (ZYPREXA) 15  MG tablet, Take 1 tablet (15 mg total) by mouth at bedtime., Disp: 30 tablet, Rfl: 0   traZODone (DESYREL) 100 MG tablet, Take 1 tablet (100 mg total) by mouth at bedtime as needed for sleep., Disp: 7 tablet, Rfl: 0  Allergies: No Known Allergies  [name], NP

## 2023-01-21 ENCOUNTER — Other Ambulatory Visit: Payer: Self-pay

## 2023-01-21 ENCOUNTER — Emergency Department
Admission: EM | Admit: 2023-01-21 | Discharge: 2023-01-22 | Disposition: A | Discharge status: Other Acute Inpatient Hospital | Payer: Self-pay | Attending: Emergency Medicine | Admitting: Emergency Medicine

## 2023-01-21 DIAGNOSIS — F339 Major depressive disorder, recurrent, unspecified: Secondary | ICD-10-CM | POA: Insufficient documentation

## 2023-01-21 DIAGNOSIS — R45851 Suicidal ideations: Secondary | ICD-10-CM

## 2023-01-21 DIAGNOSIS — F209 Schizophrenia, unspecified: Secondary | ICD-10-CM

## 2023-01-21 DIAGNOSIS — F2081 Schizophreniform disorder: Secondary | ICD-10-CM | POA: Diagnosis present

## 2023-01-21 DIAGNOSIS — F1510 Other stimulant abuse, uncomplicated: Secondary | ICD-10-CM | POA: Diagnosis present

## 2023-01-21 DIAGNOSIS — F332 Major depressive disorder, recurrent severe without psychotic features: Secondary | ICD-10-CM

## 2023-01-21 LAB — COMPREHENSIVE METABOLIC PANEL
ALT: 25 U/L (ref 0–44)
AST: 17 U/L (ref 15–41)
Albumin: 4.6 g/dL (ref 3.5–5.0)
Alkaline Phosphatase: 71 U/L (ref 38–126)
Anion gap: 7 (ref 5–15)
BUN: 10 mg/dL (ref 6–20)
CO2: 27 mmol/L (ref 22–32)
Calcium: 9.3 mg/dL (ref 8.9–10.3)
Chloride: 103 mmol/L (ref 98–111)
Creatinine, Ser: 0.83 mg/dL (ref 0.61–1.24)
GFR, Estimated: >60 mL/min (ref >60)
Glucose, Bld: 96 mg/dL (ref 70–99)
Potassium: 3.8 mmol/L (ref 3.5–5.1)
Sodium: 137 mmol/L (ref 135–145)
Total Bilirubin: 0.7 mg/dL (ref <1.2)
Total Protein: 8 g/dL (ref 6.5–8.1)

## 2023-01-21 LAB — URINE DRUG SCREEN, QUALITATIVE (ARMC ONLY)
Amphetamines, Ur Screen: NOT DETECTED
Barbiturates, Ur Screen: NOT DETECTED
Benzodiazepine, Ur Scrn: NOT DETECTED
Cannabinoid 50 Ng, Ur ~~LOC~~: NOT DETECTED
Cocaine Metabolite,Ur ~~LOC~~: NOT DETECTED
MDMA (Ecstasy)Ur Screen: NOT DETECTED
Methadone Scn, Ur: NOT DETECTED
Opiate, Ur Screen: NOT DETECTED
Phencyclidine (PCP) Ur S: NOT DETECTED
Tricyclic, Ur Screen: NOT DETECTED

## 2023-01-21 LAB — CBC
HCT: 44 % (ref 39.0–52.0)
Hemoglobin: 14.5 g/dL (ref 13.0–17.0)
MCH: 31.5 pg (ref 26.0–34.0)
MCHC: 33 g/dL (ref 30.0–36.0)
MCV: 95.4 fL (ref 80.0–100.0)
Platelets: 242 10*3/uL (ref 150–400)
RBC: 4.61 MIL/uL (ref 4.22–5.81)
RDW: 13.4 % (ref 11.5–15.5)
WBC: 7.2 10*3/uL (ref 4.0–10.5)
nRBC: 0 % (ref 0.0–0.2)

## 2023-01-21 LAB — SALICYLATE LEVEL: Salicylate Lvl: <7 mg/dL — ABNORMAL LOW (ref 7.0–30.0)

## 2023-01-21 LAB — ETHANOL: Alcohol, Ethyl (B): <10 mg/dL (ref <10)

## 2023-01-21 LAB — ACETAMINOPHEN LEVEL: Acetaminophen (Tylenol), Serum: <10 ug/mL — ABNORMAL LOW (ref 10–30)

## 2023-01-21 MED ORDER — LORAZEPAM 0.5 MG PO TABS
0.5000 mg | ORAL_TABLET | Freq: Three times a day (TID) | ORAL | Status: DC
  Administered 2023-01-21: 0.5 mg via ORAL
  Filled 2023-01-21: qty 1

## 2023-01-21 MED ORDER — TRAZODONE HCL 100 MG PO TABS: 100.0000 mg | ORAL_TABLET | Freq: Every evening | ORAL | Status: DC | PRN

## 2023-01-21 MED ORDER — OLANZAPINE 5 MG PO TABS
15.0000 mg | ORAL_TABLET | Freq: Every day | ORAL | Status: DC
  Administered 2023-01-21: 15 mg via ORAL
  Filled 2023-01-21: qty 1

## 2023-01-21 NOTE — Consult Note (Signed)
[facility] Psychiatric Consult Initial  Patient Name: .Ethan Sutton  MRN: [id number]  DOB: Feb 03, 1985  Consult Order details:  Orders (From admission, onward)     Start     Ordered   01/21/23 1403  CONSULT TO CALL ACT TEAM       Ordering Provider: Minna Antis, MD  Provider:  (Not yet assigned)  Question:  Reason for Consult?  Answer:  Psych consult   01/21/23 1402   01/21/23 1403  IP CONSULT TO PSYCHIATRY       Ordering Provider: Minna Antis, MD  Provider:  (Not yet assigned)  Question Answer Comment  Consult Timeframe ROUTINE - requires response within 24 hours   Reason for Consult? Consult for medication management   Contact phone number where the requesting provider can be reached [phone]      01/21/23 1402   01/21/23 1337  CONSULT TO CALL ACT TEAM       Ordering Provider: Minna Antis, MD  Provider:  (Not yet assigned)  Question:  Reason for Consult?  Answer:  SI   01/21/23 1336             Mode of Visit: Tele-visit Virtual Statement:TELE PSYCHIATRY ATTESTATION & CONSENT As the provider for this telehealth consult, I attest that I verified the patient's identity using two separate identifiers, introduced myself to the patient, provided my credentials, disclosed my location, and performed this encounter via a HIPAA-compliant, real-time, face-to-face, two-way, interactive audio and video platform and with the full consent and agreement of the patient (or guardian as applicable.) Patient physical location: [facility]. Telehealth provider physical location: home office in state of [place].   Video start time: 1700 Video end time: 1800    Psychiatry Consult Evaluation  Service Date: January 22, 2023 LOS:  LOS: 0 days  Chief Complaint "I'm not feeling good since being off my medications.'  Primary Psychiatric Diagnoses  Schizophreniform Disorder 2.  Suicidal Ideations  Assessment  Ethan Sutton is a 37 y.o. male admitted: Presented to the ED voluntarily  on 01/21/2023  1:55 PM for evaluation of suicidal ideations. He carries the psychiatric diagnoses of Schizophreniform disorder, suicidal ideations, psychosis and amphetamine abuse.  His current UDS is negative.   His current presentation of depressed mood and suicidal thoughts, wanting to hurt himself and do drugs is most consistent with depression. He meets criteria for psychiatric admission based on above.  Current outpatient psychotropic medications include Abilify and Trazodone and historically he has had unchanged response to these medications. He was non compliant with medications prior to admission as evidenced by self admission.  On initial examination, Ethan Sutton, 37 y.o., male seen via telepsych by this provider; chart reviewed and consulted with Dr. Marlou Porch on 01/22/23.  On evaluation Ethan Sutton is observed in exam room dangled at the bedside; he is alert/oriented x 4; calm/cooperative; and mood congruent with affect.  Patient is speaking in a clear tone at moderate volume, and normal pace; with good eye contact. His thought process is coherent and relevant; There is no indication that he is currently responding to internal/external stimuli or experiencing delusional thought content.  Patient endorses suicidal thoughts and endorses a plan to use drug or hurt himself.  He denies homicidal ideation, psychosis, and paranoia.  Patient has remained calm throughout assessment and has answered questions appropriately.   Please see plan below for detailed recommendations.   Diagnoses:  Active Hospital problems: Principal Problem:   Major depressive disorder, recurrent episode (HCC) Active Problems:  Amphetamine abuse (HCC)   Schizophreniform disorder (HCC)   Suicidal ideation    Plan   ## Psychiatric Medication Recommendations:  Restart home medications; olanzapine 15mg  po daily; trazodone 100mg  po at bedtime sleep; lorazepam 0.5mg  po TID prn anxiety  ## Medical Decision Making  Capacity:  Patient makes his own decision.   ## Further Work-up:  While pt on Qtc prolonging medications, please monitor & replete K+ to 4 and Mg2+ to 2 -- most recent EKG on 12/23 had QtC of 397 -- Pertinent labwork reviewed earlier this admission includes: CMP- WNL CBC- no leukocytosis; WNL UDS is negative  ## Disposition:-- We recommend inpatient psychiatric hospitalization when medically cleared. Patient is under voluntary admission status at this time; please IVC if attempts to leave hospital.  ## Behavioral / Environmental: -Recommend using specific terminology regarding PNES, i.e. call the episodes "non-epileptic seizures" rather than "pseudoseizures" as the latter insinuates "fake" or "feigned" symptoms, when the events are a very real experience to the patient and are a physical, non-volitional, manifestation of fear, pain and anxiety.  or Utilize compassion and acknowledge the patient's experiences while setting clear and realistic expectations for care.    ## Safety and Observation Level:  - Based on my clinical evaluation, I estimate the patient to be at low risk of self harm in the current setting. - At this time, we recommend  routine. This decision is based on my review of the chart including patient's history and current presentation, interview of the patient, mental status examination, and consideration of suicide risk including evaluating suicidal ideation, plan, intent, suicidal or self-harm behaviors, risk factors, and protective factors. This judgment is based on our ability to directly address suicide risk, implement suicide prevention strategies, and develop a safety plan while the patient is in the clinical setting. Please contact our team if there is a concern that risk level has changed.  CSSR Risk Category:C-SSRS RISK CATEGORY: High Risk  Suicide Risk Assessment: Patient has following modifiable risk factors for suicide: active suicidal ideation, untreated depression,  recklessness, and medication noncompliance, which we are addressing by referral to inpatient admission to restart psych medications and monitoring for mood stability and safety. Patient has following non-modifiable or demographic risk factors for suicide: male gender, history of self harm behavior, and psychiatric hospitalization Patient has the following protective factors against suicide: Access to outpatient mental health care  Thank you for this consult request. Recommendations have been communicated to the primary team.  We will refer for psychiatric hospitalization at this time.   Chales Abrahams, NP       History of Present Illness  Relevant Aspects of Hospital ED Course:  Admitted on 01/21/2023 for suicidal ideations and plan to hurt himself or use drugs.   Patient Report:  Patient states she has not had his medications in about a week and he has not been feeling good.  He reports intrusive thoughts of wanting to use street drugs or overdose on pills.  States he stopped medications about one week ago, but stopped because he did not think they were not working.  He states when his medications work well, they help his SI. He reports a hx of polysubstance usage, but denies current usage, UDS substantiates this.  He denies homicidal thoughts or AVH.  No command hallucinations.   He has a hx for homelessness but reports he currently resides with his parents.     Psych ROS:  Depression: current Anxiety:  denies Mania (lifetime and current): denies  Psychosis: (lifetime and current): + hx but does not appear psychotic today; does not appear paranoid,  or delusional  Collateral information:  Deferred  Review of Systems  Constitutional: Negative.   HENT: Negative.    Eyes: Negative.   Respiratory: Negative.    Cardiovascular: Negative.   Gastrointestinal: Negative.   Genitourinary: Negative.   Musculoskeletal: Negative.   Skin: Negative.   Neurological: Negative.    Endo/Heme/Allergies: Negative.   Psychiatric/Behavioral:  Positive for depression and suicidal ideas. Negative for hallucinations and substance abuse. The patient is nervous/anxious.      Psychiatric and Social History  Psychiatric History:  Information collected from patient and chart review  Prev Dx/Sx: Suicidal ideation, schizophreniform disorder, amphetamine, psychosis, malingering Current Psych Provider: [name] and [facility] Home Meds (current): olanzapine 15mg  po daily; trazodone 100mg  po bedtime Previous Med Trials: denies Therapy: denies  Prior Psych Hospitalization: yes  Prior Self Harm: denies Prior Violence: denies  Family Psych History: denies contribution Family Hx suicide: denies  Social History:  Developmental Hx: states he met all developmental milestones Educational Hx: 12 th grade Occupational Hx: 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 Living Situation: lives with his parents Spiritual Hx: denies Access to weapons/lethal means: denies   Substance History Alcohol: denies  Type of alcohol n/a Last Drink n/a Number of drinks per day n/a History of alcohol withdrawal seizures n/a History of DT's n/a Tobacco: denies Illicit drugs: hx of amphetamine abuse Prescription drug abuse: denies Rehab hx: denies  Exam Findings  Physical Exam: see below for details Vital Signs:  Temp:  [98 F (36.7 C)-98.5 F (36.9 C)] 98 F (36.7 C) (12/24 1931) Pulse Rate:  [59-83] 59 (12/24 1931) Resp:  [16-17] 16 (12/24 1931) BP: (101-108)/(72-95) 101/72 (12/24 1931) SpO2:  [100 %] 100 % (12/24 1931) Weight:  [79.4 kg] 79.4 kg (12/24 1335) Blood pressure 101/72, pulse (!) 59, temperature 98 F (36.7 C), temperature source Oral, resp. rate 16, height 5\' 6"  (1.676 m), weight 79.4 kg, SpO2 100%. Body mass index is 28.25 kg/m.  Physical Exam Vitals and nursing note reviewed.  Cardiovascular:     Rate and Rhythm: Normal rate.     Pulses: Normal pulses.  Pulmonary:     Effort:  Pulmonary effort is normal.  Musculoskeletal:        General: Normal range of motion.     Cervical back: Normal range of motion.  Neurological:     Mental Status: He is alert and oriented to person, place, and time.  Psychiatric:        Attention and Perception: Attention and perception normal.        Mood and Affect: Mood is anxious and depressed. Affect is blunt.        Speech: Speech normal.        Behavior: Behavior normal. Behavior is cooperative.        Thought Content: Thought content is not paranoid or delusional. Thought content includes suicidal ideation. Thought content does not include homicidal ideation. Thought content does not include homicidal or suicidal plan.        Cognition and Memory: Cognition normal. Memory is impaired.        Judgment: Judgment is impulsive.     Mental Status Exam: General Appearance: Disheveled  Orientation:  Full (Time, Place, and Person)  Memory:  Immediate;   Fair Recent;   Fair Remote;   Fair  Concentration:  Concentration: Fair  Recall:  Fair  Attention  Fair  Eye Contact:  Good  Speech:  Clear and Coherent  Language:  Good  Volume:  Decreased  Mood: I'm not feeling to good"  Affect:  Blunt and Congruent  Thought Process:  Linear  Thought Content:  Logical and Rumination  Suicidal Thoughts:  Yes.  without intent/plan  Homicidal Thoughts:  No  Judgement:  Intact  Insight:  Lacking  Psychomotor Activity:  Normal  Akathisia:  No  Fund of Knowledge:  Good      Assets:  Communication Skills Desire for Improvement  Cognition:  WNL  ADL's:  Impaired  AIMS (if indicated):        Other History   These have been pulled in through the EMR, reviewed, and updated if appropriate.  Family History:  The patient's family history is not on file.  Medical History: Past Medical History:  Diagnosis Date   Schizophrenia [facility])     Surgical History: Past Surgical History:  Procedure Laterality Date   NO PAST SURGERIES        Medications:   Current Facility-Administered Medications:    LORazepam (ATIVAN) tablet 0.5 mg, 0.5 mg, Oral, TID, Ophelia Shoulder E, NP, 0.5 mg at 01/21/23 2059   OLANZapine (ZYPREXA) tablet 15 mg, 15 mg, Oral, QHS, Ophelia Shoulder E, NP, 15 mg at 01/21/23 2059   traZODone (DESYREL) tablet 100 mg, 100 mg, Oral, QHS PRN, Chales Abrahams, NP  Current Outpatient Medications:    ibuprofen (ADVIL) 200 MG tablet, Take 200 mg by mouth as needed for headache or mild pain (pain score 1-3)., Disp: , Rfl:    OLANZapine (ZYPREXA) 15 MG tablet, Take 1 tablet (15 mg total) by mouth at bedtime., Disp: 30 tablet, Rfl: 0   traZODone (DESYREL) 100 MG tablet, Take 1 tablet (100 mg total) by mouth at bedtime as needed for sleep., Disp: 7 tablet, Rfl: 0  Allergies: No Known Allergies  Chales Abrahams, NP

## 2023-01-21 NOTE — ED Notes (Signed)
This RN to bedside to introduce self to pt. Pt states "I missed my apt in [place] on 12/18 and I am out of my pills. They normally give me my pills and I havent had my pills since 12/18" Pt is caox4, in no acute distress and answering all questions as asked.

## 2023-01-21 NOTE — BH Assessment (Signed)
Comprehensive Clinical Assessment (CCA) Screening, Triage and Referral Note  01/21/2023 Ethan Sutton [id number]  Ethan Sutton, 37 year old male who presents to [facility] ED involuntarily for treatment. Per triage note, Pt sts that he is not feeling well. Pt sts that she is wanting to hurt himself and do drugs. Pt sts that he is schizophrenic, and it is making him think this way. Pt sts that he did not follow up with [name] on Dec 18th. Pt sts that he has not been taking any of his prescribed medication.   During TTS assessment pt presents alert and oriented x 4, restless but cooperative, and mood-congruent with affect. The pt does not appear to be responding to internal or external stimuli. Neither is the pt presenting with any delusional thinking. Pt verified the information provided to triage RN.   Pt identifies his main complaint to be that he has not been feeling well due to not having his medications. Patient states he has been having thoughts of wanting to use drugs and overdose. Patient reports he was in the process of getting his medications adjusted; however, he missed his appointment. Patient denies using any illicit substances and alcohol.  Patient reports he does not want to kill himself but simply wants the thoughts to go away. Pt denies current HI/AH/VH.    Per Marlinda Mike, NP pt is recommended for inpatient psychiatric admission.  Chief Complaint:  Chief Complaint  Patient presents with   Mental Health Problem   Visit Diagnosis: Schizophrenia  Patient Reported Information How did you hear about [name]? Self  What Is the Reason for Your Visit/Call Today? Patient says he is having thoughts about hurting himself and using drugs.  How Long Has This Been Causing You Problems? 1 wk - 1 month  What Do You Feel Would Help You the Most Today? Treatment for Depression or other mood problem; Medication(s)   Have You Recently Had Any Thoughts About Hurting Yourself? Yes  Are You Planning to  Commit Suicide/Harm Yourself At This time? No   Have you Recently Had Thoughts About Hurting Someone [name]? No  Are You Planning to Harm Someone at This Time? No  Explanation: Denies HI   Have You Used Any Alcohol or Drugs in the Past 24 Hours? No  How Long Ago Did You Use Drugs or Alcohol? No data recorded What Did You Use and How Much? No data recorded  Do You Currently Have a Therapist/Psychiatrist? No data recorded Name of Therapist/Psychiatrist: Pt denies having a therapist.   Have You Been Recently Discharged From Any Office Practice or Programs? No  Explanation of Discharge From Practice/Program: N/A    CCA Screening Triage Referral Assessment Type of Contact: Face-to-Face  Telemedicine Service Delivery:   Is this Initial or Reassessment? Is this Initial or Reassessment?: Initial Assessment  Date Telepsych consult ordered in [facility]:    Time Telepsych consult ordered in [facility]:    Location of Assessment: [facility] ED  Provider Location: [facility] ED    Collateral Involvement: None noted   Does Patient Have a Court Appointed Legal Guardian? No data recorded Name and Contact of Legal Guardian: No data recorded If Minor and Not Living with Parent(s), Who has Custody? N/A  Is CPS involved or ever been involved? Never  Is APS involved or ever been involved? Never   Patient Determined To Be At Risk for Harm To Self or Others Based on Review of Patient Reported Information or Presenting Complaint? Yes, for Self-Harm  Method: No Plan  Availability of Means: No access or NA  Intent: Vague intent or NA  Notification Required: No need or identified person  Additional Information for Danger to Others Potential: -- (NA)  Additional Comments for Danger to Others Potential: None noted  Are There Guns or Other Weapons in Your Home? No  Types of Guns/Weapons: NA  Are These Weapons Safely Secured?                            -- (NA)  Who Could Verify You Are Able To Have These  Secured: NA  Do You Have any Outstanding Charges, Pending Court Dates, Parole/Probation? Pt denies pending legal charges  Contacted To Inform of Risk of Harm To Self or Others: -- (n/a)   Does Patient Present under Involuntary Commitment? No    [place] of Residence: [place]   Patient Currently Receiving the Following Services: Medication Management   Determination of Need: Emergent (2 hours)   Options For Referral: Inpatient Hospitalization; Medication Management   Disposition Recommendation per psychiatric provider: We recommend inpatient psychiatric hospitalization when medically cleared. Patient is under voluntary admission status at this time; please IVC if attempts to leave hospital.  Clerance Lav, Counselor, LCAS-A

## 2023-01-21 NOTE — ED Provider Notes (Signed)
[facility] Provider Note    Event Date/Time   First MD Initiated Contact with Patient 01/21/23 1356     (approximate)  History   Chief Complaint: Mental Health Problem  HPI  Ethan Sutton is a 37 y.o. male with schizophrenia who presents to the emergency department for help with his mental health.  According to the patient he states he has a history of schizophrenia, he has been off all of his medications for the past 1 week.  States he has been having some thoughts of overdosing on medications although states he would never do that.  States he recently missed his appointment with [name].  States he believes he needs to be restarted on his medications so he came to the emergency department looking for help.  He has no active plan to hurt himself and states he would not act upon that.  Denies any drug or alcohol use.  Denies any medical complaints or chronic medical conditions besides schizophrenia.  Physical Exam   Triage Vital Signs: ED Triage Vitals [01/21/23 1335]  Encounter Vitals Group     BP (!) 108/95     Systolic BP Percentile      Diastolic BP Percentile      Pulse Rate 83     Resp 17     Temp 98.5 F (36.9 C)     Temp Source Oral     SpO2 100 %     Weight 175 lb (79.4 kg)     Height 5\' 6"  (1.676 m)     Head Circumference      Peak Flow      Pain Score 0     Pain Loc      Pain Education      Exclude from Growth Chart     Most recent vital signs: Vitals:   01/21/23 1335  BP: (!) 108/95  Pulse: 83  Resp: 17  Temp: 98.5 F (36.9 C)  SpO2: 100%    General: Awake, no distress.  CV:  Good peripheral perfusion.  Regular rate and rhythm  Resp:  Normal effort.  Equal breath sounds bilaterally.  Abd:  No distention.  Soft, nontender.  No rebound or guarding.  ED Results / Procedures / Treatments   MEDICATIONS ORDERED IN ED: Medications - No data to display   IMPRESSION / MDM / ASSESSMENT AND PLAN / ED COURSE  I reviewed the triage  vital signs and the nursing notes.  Patient's presentation is most consistent with acute presentation with potential threat to life or bodily function.  Patient presents to the emergency department with schizophrenia been off of his medications for 1 week and believes he needs to be restarted on medications.  Patient states occasional vague depression or suicidal thoughts although denies any active plan to do so and states he would not hurt himself.  Does not meet IVC criteria at this time.  Patient is willing to stay here voluntarily to speak to psychiatry.  We will check labs and continue to closely monitor.  No medical complaints today.  Patient is lab work shows a negative urine drug screen alcohol salicylate and acetaminophen level.  Patient CBC is reassuring and chemistry is normal.  Awaiting psychiatric evaluation and disposition.  Patient remains here voluntarily.  FINAL CLINICAL IMPRESSION(S) / ED DIAGNOSES   Schizophrenia   Note:  This document was prepared using Dragon voice recognition software and may include unintentional dictation errors.   Minna Antis, MD 01/21/23 [phone]

## 2023-01-21 NOTE — ED Notes (Signed)
Pt changed into psych approved clothing.  Pt has...  Brown boots [employer] vest [employer] pants [employer] Green belt White Underwear Gray Socks Blue Sweat Shirt White Shirt  Pt has a Blue,white and black book bag that has a lap top, and cell phone with multiple food items in it as well as D/C papers from other psych facilities.

## 2023-01-21 NOTE — BH Assessment (Signed)
 Per [facility] Everardo Pacific), patient to be referred out of system.  Referral information for Psychiatric Hospitalization faxed to;   [facility] [phone]- [phone]) No available beds  [facility] [phone]- [phone]),   [facility] [phone]),  [address] [phone]),   [facility] [phone] -or- [phone]),   [facility] [phone])  [facility] [phone])

## 2023-01-21 NOTE — ED Triage Notes (Signed)
Pt sts that he is not feeling well. Pt sts that she is wanting to hurt himself and do drugs. Pt sts that he is schizophrenic and it is making him think this way. Pt sts that he did not follow up with [name] on Dec 18th. Pt sts that he has not been taking any of his prescribed medication.

## 2023-01-21 NOTE — Consult Note (Signed)
Patient seen by psychiatry and referred for inpatient admission. Recommend restarting home medications. Complete consult note to follow.

## 2023-01-21 NOTE — ED Notes (Signed)
Pt given snack tray and apple juice per request

## 2023-01-22 DIAGNOSIS — F339 Major depressive disorder, recurrent, unspecified: Secondary | ICD-10-CM | POA: Insufficient documentation

## 2023-01-22 NOTE — ED Notes (Signed)
Report given to Ova Freshwater, RN at this time from [facility]

## 2023-01-22 NOTE — ED Notes (Signed)
PATIENT BED AVAILABLE AFTER 9AM ON 01/22/23   Patient has been accepted to [facility].  Patient assigned to [facility]-Unit Accepting physician is Dr. Sherrian Divers.  Call report to [phone].  Representative was Prudence.

## 2023-01-22 NOTE — ED Provider Notes (Signed)
Patient resting, in no distress.  Alert.  Appears appropriate for transfer via safe transport.  No psychomotor agitation   Sharyn Creamer, MD 01/22/23 [phone]

## 2023-01-22 NOTE — BH Assessment (Signed)
PATIENT BED AVAILABLE AFTER 9AM ON 01/22/23  Patient has been accepted to [facility].  Patient assigned to 400-Unit Accepting physician is Dr. Sherrian Divers.  Call report to [phone].  Representative was Prudence.   ER Staff is aware of it:  [facility] ER Secretary  Dr. Dolores Frame, ER MD  Pattricia Boss Patient's Nurse     If no transportation available please contact facility to let them know

## 2023-01-22 NOTE — ED Notes (Signed)
[facility] called to give report on pt. Someone answered the phone and said they would take report once they finished doing something. I was put on hold and the call hung up. I never received a call back. I called to give report again and no one answered. I left a message with my name and number to call me back at.

## 2023-01-22 NOTE — ED Provider Notes (Signed)
Emergency Medicine Observation Re-evaluation Note  Ethan Sutton is a 37 y.o. male, seen on rounds today.  Pt initially presented to the ED for complaints of Mental Health Problem Currently, the patient is resting, voices no medical complaints.  Physical Exam  BP 101/72   Pulse (!) 59   Temp 98 F (36.7 C) (Oral)   Resp 16   Ht 5\' 6"  (1.676 m)   Wt 79.4 kg   SpO2 100%   BMI 28.25 kg/m  Physical Exam General: Resting in no acute distress Cardiac: No cyanosis Lungs: Equal rise and fall Psych: Not agitated  ED Course / MDM  EKG:   I have reviewed the labs performed to date as well as medications administered while in observation.  Recent changes in the last 24 hours include no events overnight.  Plan  Current plan is for psychiatric disposition.    Irean Hong, MD 01/22/23 [phone]

## 2023-02-03 ENCOUNTER — Emergency Department
Admission: EM | Admit: 2023-02-03 | Discharge: 2023-02-03 | Disposition: A | Discharge status: Home / Self Care | Payer: Self-pay | Attending: Emergency Medicine | Admitting: Emergency Medicine

## 2023-02-03 ENCOUNTER — Other Ambulatory Visit: Payer: Self-pay

## 2023-02-03 DIAGNOSIS — F2081 Schizophreniform disorder: Secondary | ICD-10-CM | POA: Insufficient documentation

## 2023-02-03 DIAGNOSIS — Z760 Encounter for issue of repeat prescription: Secondary | ICD-10-CM | POA: Insufficient documentation

## 2023-02-03 MED ORDER — OLANZAPINE 5 MG PO TABS
15.0000 mg | ORAL_TABLET | Freq: Every day | ORAL | Status: DC
  Administered 2023-02-03: 15 mg via ORAL
  Filled 2023-02-03: qty 1

## 2023-02-03 MED ORDER — OLANZAPINE 15 MG PO TABS: 15.0000 mg | ORAL_TABLET | Freq: Every day | ORAL | 0 refills | Status: DC

## 2023-02-03 NOTE — ED Provider Triage Note (Signed)
 Emergency Medicine Provider Triage Evaluation Note  Ethan Sutton , a 38 y.o. male  was evaluated in triage.  Pt complains of not having his Risperdal  in a week. [name] couldn't prescribe it today.  Physical Exam  BP 112/80   Pulse 79   Temp (!) 97.5 F (36.4 C) (Oral)   Resp 16   Ht 5' 11 (1.803 m)   Wt 75.7 kg   SpO2 100%   BMI 23.28 kg/m  Gen:   Awake, no distress   Resp:  Normal effort  MSK:   Moves extremities without difficulty  Other:    Medical Decision Making  Medically screening exam initiated at 2:02 PM.  Appropriate orders placed.  Ethan Sutton was informed that the remainder of the evaluation will be completed by another provider, this initial triage assessment does not replace that evaluation, and the importance of remaining in the ED until their evaluation is complete.    Herlinda Kirk NOVAK, FNP 02/03/23 1404

## 2023-02-03 NOTE — ED Provider Notes (Signed)
 [facility] Provider Note    Event Date/Time   First MD Initiated Contact with Patient 02/03/23 1408     (approximate)   History   Medication Refill   HPI  Ethan Sutton is a 38 y.o. male with a past medical history of schizophreniform disorder who presents today with request for his psychiatric medication.  He reports that he last took it approximately 1 week ago.  He denies auditory or visual hallucinations.  He denies suicidal or homicidal thoughts.  He reports that when he is off his medication he wants to use drugs which is how he is feeling now but he has not used any drugs.  He reports that he was seen in the hospital a few weeks ago and has not yet had an outpatient prescribing physician.  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   Psychosis (HCC) 05/14/2019          Physical Exam   Triage Vital Signs: ED Triage Vitals  Encounter Vitals Group     BP 02/03/23 1401 112/80     Systolic BP Percentile --      Diastolic BP Percentile --      Pulse Rate 02/03/23 1401 79     Resp 02/03/23 1401 16     Temp 02/03/23 1401 (!) 97.5 F (36.4 C)     Temp Source 02/03/23 1401 Oral     SpO2 02/03/23 1401 100 %     Weight 02/03/23 1355 166 lb 14.2 oz (75.7 kg)     Height 02/03/23 1355 5' 11 (1.803 m)     Head Circumference --      Peak Flow --      Pain Score 02/03/23 1400 6     Pain Loc --      Pain Education --      Exclude from Growth Chart --     Most recent vital signs: Vitals:   02/03/23 1401  BP: 112/80  Pulse: 79  Resp: 16  Temp: (!) 97.5 F (36.4 C)  SpO2: 100%    Physical Exam Vitals and nursing note reviewed.  Constitutional:      General: Awake and alert. No acute distress.     Appearance: Normal appearance. The patient is normal weight.  HENT:     Head: Normocephalic and atraumatic.     Mouth: Mucous membranes are moist.  Eyes:     General: PERRL. Normal EOMs        Right eye: No discharge.        Left eye: No discharge.     Conjunctiva/sclera: Conjunctivae normal.  Cardiovascular:     Rate and Rhythm: Normal rate and regular rhythm.     Pulses: Normal pulses.  Pulmonary:     Effort: Pulmonary effort is normal. No respiratory distress.     Breath sounds: Normal breath sounds.  Abdominal:     Abdomen is soft. There is no abdominal tenderness. No rebound or guarding. No distention. Musculoskeletal:        General: No swelling. Normal range of motion.     Cervical back: Normal range of motion and neck supple.  Skin:    General: Skin is warm and dry.     Capillary Refill: Capillary refill takes less  than 2 seconds.     Findings: No rash.  Neurological:     Mental Status: The patient is awake and alert.  Psych: Normal eye contact, normal affect, goal oriented and linear thought.  Does not appear to be responding to internal stimuli.  Denies SI/HI   ED Results / Procedures / Treatments   Labs (all labs ordered are listed, but only abnormal results are displayed) Labs Reviewed - No data to display   EKG     RADIOLOGY     PROCEDURES:  Critical Care performed:   Procedures   MEDICATIONS ORDERED IN ED: Medications  OLANZapine  (ZYPREXA ) tablet 15 mg (15 mg Oral Given 02/03/23 1443)     IMPRESSION / MDM / ASSESSMENT AND PLAN / ED COURSE  I reviewed the triage vital signs and the nursing notes.   Differential diagnosis includes, but is not limited to, schizophreniform disorder, medication refill, anxiety, depression, psychosis.  I reviewed the patient's chart.  Patient was recently seen here on 01/17/2023 with suicidal ideations.  He was seen by psychiatry who recommended restarting his home medications of olanzapine  15 mg p.o.  daily.  Patient is awake and alert, hemodynamically stable and afebrile.  He is not in any acute distress.  He does not appear to be acutely decompensated.  He does not appear to be responding to internal stimuli.  He denies SI/HI.  He demonstrates linear and organized thought process.  He requested a dose of his Zyprexa  to be given to him in the emergency department which was provided for him.  He was also given a prescription for 30-day supply.  He was encouraged to follow-up with his outpatient prescribing physician for further refills.  He understands return precautions in the meantime.  He was discharged in stable condition.  Patient's presentation is most consistent with acute complicated illness / injury requiring diagnostic workup.      FINAL CLINICAL IMPRESSION(S) / ED DIAGNOSES   Final diagnoses:  Medication refill  Schizophreniform disorder (HCC)     Rx / DC Orders   ED Discharge Orders          Ordered    OLANZapine  (ZYPREXA ) 15 MG tablet  Daily at bedtime        02/03/23 1420             Note:  This document was prepared using Dragon voice recognition software and may include unintentional dictation errors.   Benett Swoyer E, PA-C 02/03/23 1535    Arlander Charleston, MD 02/04/23 [phone]

## 2023-02-03 NOTE — ED Triage Notes (Signed)
 Pt reports he has not had his Risperdal in a week. Pt went to [name] and was told they could not prescribe his medication. Pt has hx of schizophrenia. Pt wishes to get refills.

## 2023-02-03 NOTE — Discharge Instructions (Signed)
Your medication was refilled for you.  Please follow-up with your outpatient provider.  Please return for any new, worsening, or change in symptoms or other concerns.  It was a pleasure caring for you today.

## 2023-02-09 ENCOUNTER — Encounter: Payer: Self-pay | Admitting: Psychiatric/Mental Health

## 2023-02-09 ENCOUNTER — Other Ambulatory Visit: Payer: Self-pay

## 2023-02-09 ENCOUNTER — Emergency Department
Admission: EM | Admit: 2023-02-09 | Discharge: 2023-02-10 | Disposition: A | Discharge status: Other Acute Inpatient Hospital | Payer: Self-pay | Attending: Emergency Medicine | Admitting: Emergency Medicine

## 2023-02-09 DIAGNOSIS — F2081 Schizophreniform disorder: Secondary | ICD-10-CM

## 2023-02-09 DIAGNOSIS — R45851 Suicidal ideations: Secondary | ICD-10-CM | POA: Insufficient documentation

## 2023-02-09 DIAGNOSIS — F209 Schizophrenia, unspecified: Secondary | ICD-10-CM | POA: Diagnosis present

## 2023-02-09 DIAGNOSIS — F32A Depression, unspecified: Secondary | ICD-10-CM

## 2023-02-09 HISTORY — DX: Depression, unspecified: F32.A

## 2023-02-09 LAB — COMPREHENSIVE METABOLIC PANEL
ALT: 19 U/L (ref 0–44)
AST: 18 U/L (ref 15–41)
Albumin: 4.8 g/dL (ref 3.5–5.0)
Alkaline Phosphatase: 72 U/L (ref 38–126)
Anion gap: 13 (ref 5–15)
BUN: 12 mg/dL (ref 6–20)
CO2: 25 mmol/L (ref 22–32)
Calcium: 9.4 mg/dL (ref 8.9–10.3)
Chloride: 103 mmol/L (ref 98–111)
Creatinine, Ser: 0.84 mg/dL (ref 0.61–1.24)
GFR, Estimated: >60 mL/min (ref >60)
Glucose, Bld: 90 mg/dL (ref 70–99)
Potassium: 3.6 mmol/L (ref 3.5–5.1)
Sodium: 141 mmol/L (ref 135–145)
Total Bilirubin: 0.5 mg/dL (ref 0.0–1.2)
Total Protein: 8.4 g/dL — ABNORMAL HIGH (ref 6.5–8.1)

## 2023-02-09 LAB — CBC WITH DIFFERENTIAL/PLATELET
Abs Immature Granulocytes: 0.02 10*3/uL (ref 0.00–0.07)
Basophils Absolute: 0.1 10*3/uL (ref 0.0–0.1)
Basophils Relative: 1 %
Eosinophils Absolute: 0.3 10*3/uL (ref 0.0–0.5)
Eosinophils Relative: 4 %
HCT: 46.6 % (ref 39.0–52.0)
Hemoglobin: 15.4 g/dL (ref 13.0–17.0)
Immature Granulocytes: 0 %
Lymphocytes Relative: 39 %
Lymphs Abs: 3 10*3/uL (ref 0.7–4.0)
MCH: 31.2 pg (ref 26.0–34.0)
MCHC: 33 g/dL (ref 30.0–36.0)
MCV: 94.3 fL (ref 80.0–100.0)
Monocytes Absolute: 0.4 10*3/uL (ref 0.1–1.0)
Monocytes Relative: 5 %
Neutro Abs: 4.1 10*3/uL (ref 1.7–7.7)
Neutrophils Relative %: 51 %
Platelets: 237 10*3/uL (ref 150–400)
RBC: 4.94 MIL/uL (ref 4.22–5.81)
RDW: 13.2 % (ref 11.5–15.5)
WBC: 7.9 10*3/uL (ref 4.0–10.5)
nRBC: 0 % (ref 0.0–0.2)

## 2023-02-09 LAB — SALICYLATE LEVEL: Salicylate Lvl: <7 mg/dL — ABNORMAL LOW (ref 7.0–30.0)

## 2023-02-09 LAB — URINE DRUG SCREEN, QUALITATIVE (ARMC ONLY)
Amphetamines, Ur Screen: NOT DETECTED
Barbiturates, Ur Screen: NOT DETECTED
Benzodiazepine, Ur Scrn: NOT DETECTED
Cannabinoid 50 Ng, Ur ~~LOC~~: NOT DETECTED
Cocaine Metabolite,Ur ~~LOC~~: NOT DETECTED
MDMA (Ecstasy)Ur Screen: NOT DETECTED
Methadone Scn, Ur: NOT DETECTED
Opiate, Ur Screen: NOT DETECTED
Phencyclidine (PCP) Ur S: NOT DETECTED
Tricyclic, Ur Screen: NOT DETECTED

## 2023-02-09 LAB — ETHANOL: Alcohol, Ethyl (B): <10 mg/dL (ref <10)

## 2023-02-09 LAB — ACETAMINOPHEN LEVEL: Acetaminophen (Tylenol), Serum: <10 ug/mL — ABNORMAL LOW (ref 10–30)

## 2023-02-09 MED ORDER — OLANZAPINE 5 MG PO TABS
15.0000 mg | ORAL_TABLET | Freq: Every day | ORAL | Status: DC
  Administered 2023-02-09: 15 mg via ORAL
  Filled 2023-02-09: qty 1

## 2023-02-09 NOTE — BH Assessment (Signed)
 Comprehensive Clinical Assessment (CCA) Note  02/09/2023 Ethan Sutton [id number] Recommendations for Services/Supports/Treatments: Psych NP Rashaun D. determined pt. meets psychiatric inpatient criteria. Since arrival to the ED, the patient has been calm, cooperative, and polite. The patient has not had any behavioral or aggressive episodes while in the psych ED. He has not required any emergency interventions. On assessment, the patient was forth coming and cooperative throughout the assessment, admitting that he has intrusive thoughts of SI with a plan to overdose on Fentanyl. Pt reported having a hx of overdosing, explaining that he is trying to refrain from drug and alcohol abuse. Pt reported that he that he'd presented to the hospital for due to following up with [name] and being told that they do not prescribe medication. Of note, the pt. reported that he feels that he needs medication management/adjustment. Pt. reported that his thoughts are disturbing/worrisome and have been reoccurring all day. Pt reported that he usually resorts to drinking and "tearing things up" to cope with the thoughts. Pt reported that he had gotten into a disagreement with his mother. Pt spoke at a normal volume and rate. Pt had an anxious mood and a congruent affect. Pt continued to endorse current SI. Pt denied HI/AV/H. BAL/UDS are unremarkable.  Chief Complaint:  Chief Complaint  Patient presents with   Mental Health Problem   Visit Diagnosis: Schizophrenia 2.  Suicidal ideation      CCA Screening, Triage and Referral (STR)  Patient Reported Information How did you hear about us ? Self  Referral name: No data recorded Referral phone number: No data recorded  Whom do you see for routine medical problems? No data recorded Practice/Facility Name: No data recorded Practice/Facility Phone Number: No data recorded Name of Contact: No data recorded Contact Number: No data recorded Contact Fax Number: No data  recorded Prescriber Name: No data recorded Prescriber Address (if known): No data recorded  What Is the Reason for Your Visit/Call Today? Pt sts that he has been feeling of overdosing on fentanyl. Pt sts that he was seen here recently and was prescribed zyprexa  and nothing is working. Pt sts that he wants to overdose at this time.  How Long Has This Been Causing You Problems? 1-6 months  What Do You Feel Would Help You the Most Today? Treatment for Depression or other mood problem   Have You Recently Been in Any Inpatient Treatment (Hospital/Detox/Crisis Center/28-Day Program)? No data recorded Name/Location of Program/Hospital:No data recorded How Long Were You There? No data recorded When Were You Discharged? No data recorded  Have You Ever Received Services From [facility] Before? No data recorded Who Do You See at [facility]? No data recorded  Have You Recently Had Any Thoughts About Hurting Yourself? Yes  Are You Planning to Commit Suicide/Harm Yourself At This time? Yes   Have you Recently Had Thoughts About Hurting Someone Sherral? No  Explanation: Pt endorseds intrusive thoughts of SI with a plan to overdose on Fentanyl and OTC medications.   Have You Used Any Alcohol or Drugs in the Past 24 Hours? No  How Long Ago Did You Use Drugs or Alcohol? No data recorded What Did You Use and How Much? No data recorded  Do You Currently Have a Therapist/Psychiatrist? No data recorded Name of Therapist/Psychiatrist: [name]   Have You Been Recently Discharged From Any Office Practice or Programs? No  Explanation of Discharge From Practice/Program: N/A     CCA Screening Triage Referral Assessment Type of Contact: Face-to-Face  Is  this Initial or Reassessment? Initial Assessment  Date Telepsych consult ordered in [facility]:  01/11/23  Time Telepsych consult ordered in [facility]:  1727   Patient Reported Information Reviewed? No data recorded Patient Left Without Being Seen? No data  recorded Reason for Not Completing Assessment: No data recorded  Collateral Involvement: None noted   Does Patient Have a Court Appointed Legal Guardian? No data recorded Name and Contact of Legal Guardian: No data recorded If Minor and Not Living with Parent(s), Who has Custody? N/A  Is CPS involved or ever been involved? Never  Is APS involved or ever been involved? Never   Patient Determined To Be At Risk for Harm To Self or Others Based on Review of Patient Reported Information or Presenting Complaint? Yes, for Self-Harm  Method: Plan with intent and identified person  Availability of Means: No access or NA  Intent: Clearly intends on inflicting harm that could cause death  Notification Required: No need or identified person  Additional Information for Danger to Others Potential: -- (N/A)  Additional Comments for Danger to Others Potential: None noted  Are There Guns or Other Weapons in Your Home? No  Types of Guns/Weapons: NA  Are These Weapons Safely Secured?                            -- (N/A)  Who Could Verify You Are Able To Have These Secured: NA  Do You Have any Outstanding Charges, Pending Court Dates, Parole/Probation? Pt denied having pending legal charges.  Contacted To Inform of Risk of Harm To Self or Others: -- (N/A)   Location of Assessment: [facility] ED   Does Patient Present under Involuntary Commitment? No  IVC Papers Initial File Date: No data recorded  [place] of Residence: [place]   Patient Currently Receiving the Following Services: Medication Management   Determination of Need: Emergent (2 hours)   Options For Referral: Inpatient Hospitalization; Medication Management; ED Visit     CCA Biopsychosocial Intake/Chief Complaint:  No data recorded Current Symptoms/Problems: No data recorded  Patient Reported Schizophrenia/Schizoaffective Diagnosis in Past: Yes   Strengths: Pt is willing to participate in treatment; pt advocates  for himself; pt is able to ask for help.  Preferences: No data recorded Abilities: No data recorded  Type of Services Patient Feels are Needed: No data recorded  Initial Clinical Notes/Concerns: No data recorded  Mental Health Symptoms Depression:  Irritability; Sleep (too much or little); Hopelessness; Worthlessness   Duration of Depressive symptoms: Greater than two weeks   Mania:  None   Anxiety:   Worrying; Tension; Irritability   Psychosis:  None   Duration of Psychotic symptoms: No data recorded  Trauma:  N/A   Obsessions:  Recurrent & persistent thoughts/impulses/images; Good insight; Disrupts routine/functioning; Intrusive/time consuming   Compulsions:  N/A   Inattention:  N/A   Hyperactivity/Impulsivity:  N/A   Oppositional/Defiant Behaviors:  Argumentative   Emotional Irregularity:  N/A   Other Mood/Personality Symptoms:  None noted    Mental Status Exam Appearance and self-care  Stature:  Small   Weight:  Average weight   Clothing:  Casual   Grooming:  Normal   Cosmetic use:  None   Posture/gait:  Normal   Motor activity:  Not Remarkable   Sensorium  Attention:  Normal   Concentration:  Normal   Orientation:  X5   Recall/memory:  Normal   Affect and Mood  Affect:  Appropriate   Mood:  Anxious   Relating  Eye contact:  Fleeting   Facial expression:  Responsive   Attitude toward examiner:  Cooperative   Thought and Language  Speech flow: Clear and Coherent   Thought content:  Appropriate to Mood and Circumstances   Preoccupation:  None   Hallucinations:  None   Organization:  No data recorded  [employer] of Knowledge:  Fair   Intelligence:  Average   Abstraction:  Normal   Judgement:  Fair   Dance Movement Psychotherapist:  Realistic   Insight:  Fair   Decision Making:  Normal   Social Functioning  Social Maturity:  Responsible   Social Judgement:  Normal   Stress  Stressors:  Illness   Coping  Ability:  Exhausted; Overwhelmed   Skill Deficits:  None   Supports:  Family     Religion: Religion/Spirituality Are You A Religious Person?: No How Might This Affect Treatment?: NA  Leisure/Recreation: Leisure / Recreation Do You Have Hobbies?: No  Exercise/Diet: Exercise/Diet Do You Exercise?: No Have You Gained or Lost A Significant Amount of Weight in the Past Six Months?: No Do You Follow a Special Diet?: No   CCA Employment/Education Employment/Work Situation: Employment / Work Situation Employment Situation: Unemployed Patient's Job has Been Impacted by Current Illness: No Has Patient ever Been in Equities Trader?: No  Education: Education Is Patient Currently Attending School?: No Last Grade Completed: 12 Did You Product Manager?: No Did You Have An Individualized Education Program (IIEP): No Did You Have Any Difficulty At [employer]?: No Patient's Education Has Been Impacted by Current Illness: No   CCA Family/Childhood History Family and Relationship History: Family history Marital status: Single Does patient have children?: No  Childhood History:  Childhood History By whom was/is the patient raised?: Mother Did patient suffer any verbal/emotional/physical/sexual abuse as a child?: No Did patient suffer from severe childhood neglect?: No Has patient ever been sexually abused/assaulted/raped as an adolescent or adult?: No Was the patient ever a victim of a crime or a disaster?: No Witnessed domestic violence?: No Has patient been affected by domestic violence as an adult?: No  Child/Adolescent Assessment:     CCA Substance Use Alcohol/Drug Use: Alcohol / Drug Use Over the Counter: See PTA History of alcohol / drug use?: No history of alcohol / drug abuse Longest period of sobriety (when/how long): Unknown Withdrawal Symptoms: None                         ASAM's:  Six Dimensions of Multidimensional Assessment  Dimension 1:  Acute  Intoxication and/or Withdrawal Potential:      Dimension 2:  Biomedical Conditions and Complications:      Dimension 3:  Emotional, Behavioral, or Cognitive Conditions and Complications:     Dimension 4:  Readiness to Change:     Dimension 5:  Relapse, Continued use, or Continued Problem Potential:     Dimension 6:  Recovery/Living Environment:     ASAM Severity Score:    ASAM Recommended Level of Treatment:     Substance use Disorder (SUD)    Recommendations for Services/Supports/Treatments:    DSM5 Diagnoses: Patient Active Problem List   Diagnosis Date Noted   Depression 02/09/2023   Major depressive disorder, recurrent episode (HCC) 01/22/2023   Suicidal ideation 01/12/2023   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

## 2023-02-09 NOTE — ED Triage Notes (Signed)
 Pt sts that he has been feeling of overdosing on fentanyl. Pt sts that he was seen here recently and was prescribed zyprexa and nothing is working. Pt sts that he wants to overdose at this time.

## 2023-02-09 NOTE — ED Provider Triage Note (Signed)
 Emergency Medicine Provider Triage Evaluation Note  Ethan Sutton , a 38 y.o. male  was evaluated in triage.  Pt complains of meds aren't working for me. Came a few days ago and prescribed meds. Reports that he is having thoughts of wanting to hurt himself. Plans to overdose which he says he has done before with either drugs or OTC medications.  Review of Systems  Positive: SI Negative: HI, AVH  Physical Exam  There were no vitals taken for this visit. Gen:   Awake, no distress   Resp:  Normal effort  MSK:   Moves extremities without difficulty  Other:    Medical Decision Making  Medically screening exam initiated at 8:42 PM.  Appropriate orders placed.  Ethan Sutton was informed that the remainder of the evaluation will be completed by another provider, this initial triage assessment does not replace that evaluation, and the importance of remaining in the ED until their evaluation is complete.     Ethan Tangen E, PA-C 02/09/23 2049

## 2023-02-09 NOTE — Consult Note (Addendum)
 [facility] Psychiatric Consult Initial  Patient Name: .Ethan Sutton  MRN: [id number]  DOB: 1985-10-12  Consult Order details:    Mode of Visit: Tele-visit Virtual Statement:TELE PSYCHIATRY ATTESTATION & CONSENT As the provider for this telehealth consult, I attest that I verified the patient's identity using two separate identifiers, introduced myself to the patient, provided my credentials, disclosed my location, and performed this encounter via a HIPAA-compliant, real-time, face-to-face, two-way, interactive audio and video platform and with the full consent and agreement of the patient (or guardian as applicable.) Patient physical location: [facility]. Telehealth provider physical location: home office in state of [place].   Video start time:   Video end time: 1030    Psychiatry Consult Evaluation  Service Date: February 09, 2023 LOS:  LOS: 0 days  Chief Complaint SI  Primary Psychiatric Diagnoses  Schizophrenia 2.  Suicidal ideation   Assessment  Ethan Sutton is a 38 y.o. male admitted: Presented to the ED on 02/09/2023  9:03 PM for suicidal ideation. He carries the psychiatric diagnoses of schizophrenia.   His current presentation of SI and psychosis is most consistent with schizophrenia. He meets criteria for inpatient hospitalization  based on current thoughts of suicide.  Current outpatient psychotropic medications include olanzapine  15mg  at bedtime and historically he has had a mediocre response to these medications. He was non compliant with medications prior to admission as evidenced by current SI. On initial examination, patient was disheveled with delayed speech. Please see plan below for detailed recommendations.   Per chart review, patient presented to [facility] on 11/29. The chart states consulting NP obtained collateral from patient`s mother, who reported that the patient has been talking to himself, isolating in his room, urinating on the floor, cutting holes in the wall because he  thought people were starring at him and then placing putty in the holes. When I asked the patient about that type of behaviors, he said I don`t know. I don`t feel safe there.   Diagnoses:  Active Hospital problems: Principal Problem:   Schizophrenia [facility])    Plan   ## Psychiatric Medication Recommendations:  Please restart olanzapine  15mg   ## Medical Decision Making Capacity: Not specifically addressed in this encounter  ## Further Work-up:  Ethan Sutton was admitted to [facility] ER for Schizophrenia [facility]), crisis management, and stabilization. Routine labs ordered, which include  Lab Orders         Comprehensive metabolic panel         Ethanol         Urine Drug Screen, Qualitative         CBC with Diff         Acetaminophen  level         Salicylate level    Medication Management: Medications started Will maintain observation checks every 15 minutes for safety. Psychosocial education regarding relapse prevention and self-care; social and communication  Social work will consult with family for collateral information and discuss discharge and follow up plan.   ## Disposition:-- We recommend inpatient psychiatric hospitalization when medically cleared. Patient is under voluntary admission status at this time; please IVC if attempts to leave hospital.  ## Behavioral / Environmental: -Patient would benefit from more frequent contact with medical team to delineate plan of care and allow for clarification questions, which will help alleviate anxiety regarding treatment. If possible, try to check back in with the pt in the afternoon., Recommend using specific terminology regarding PNES, i.e. call the episodes non-epileptic seizures rather than pseudoseizures as  the latter insinuates fake or feigned symptoms, when the events are a very real experience to the patient and are a physical, non-volitional, manifestation of fear, pain and anxiety. , or To minimize splitting of staff,  assign one staff person to communicate all information from the team when feasible.    ## Safety and Observation Level:  - Based on my clinical evaluation, I estimate the patient to be at moderate risk of self harm in the current setting. - At this time, we recommend  routine. This decision is based on my review of the chart including patient's history and current presentation, interview of the patient, mental status examination, and consideration of suicide risk including evaluating suicidal ideation, plan, intent, suicidal or self-harm behaviors, risk factors, and protective factors. This judgment is based on our ability to directly address suicide risk, implement suicide prevention strategies, and develop a safety plan while the patient is in the clinical setting. Please contact our team if there is a concern that risk level has changed.  CSSR Risk Category:C-SSRS RISK CATEGORY: High Risk  Suicide Risk Assessment: Patient has following modifiable risk factors for suicide: under treated depression , social isolation, recklessness, medication noncompliance, and lack of access to outpatient mental health resources, which we are addressing by recommending inpatient hospitalization. Patient has following non-modifiable or demographic risk factors for suicide: male gender, history of suicide attempt, history of self harm behavior, and psychiatric hospitalization Patient has the following protective factors against suicide: Supportive family and Frustration tolerance  Thank you for this consult request. Recommendations have been communicated to the primary team.  We will recommend inpatient  at this time.   Ethan CHRISTELLA Fireman, NP       History of Present Illness  Relevant Aspects of Hospital ED Course:  Admitted on 02/09/2023 for SI.   Patient Report:  Patient says that he feels that he needs medication management/adjustment. Pt. reported that his thoughts are disturbing/worrisome and have been  reoccurring all day. Pt reported that he usually resorts to drinking and "tearing things up" to cope with the thoughts. Pt reported that he had gotten into a disagreement with his mother. Pt spoke at a normal volume and rate. Pt had an anxious mood and a congruent affect. Pt continued to endorse current SI. Pt denied HI/AV/H. BAL/UDS are unremarkable.   Psych ROS:  Depression: yes Anxiety:  yes Mania (lifetime and current): yes Psychosis: (lifetime and current): yes   Review of Systems  Psychiatric/Behavioral:  Positive for depression and suicidal ideas. Negative for hallucinations and substance abuse. The patient is nervous/anxious and has insomnia.   All other systems reviewed and are negative.    Psychiatric and Social History  Psychiatric History:  Information collected from patient and chart review  Prev Dx/Sx: schizophrenia Current Psych Provider: [name] Home Meds (current): Olanzapine  15mg  at bedtime Previous Med Trials: Rispidone, olanzapine   Therapy: None  Prior Psych Hospitalization: yes 11/29 [facility] Prior Self Harm: yes Prior Violence: unknown    Exam Findings  Physical Exam:  Vital Signs:  Temp:  [98.1 F (36.7 C)] 98.1 F (36.7 C) (01/12 2048) Pulse Rate:  [98] 98 (01/12 2048) Resp:  [18] 18 (01/12 2048) BP: (149)/(87) 149/87 (01/12 2048) SpO2:  [99 %] 99 % (01/12 2048) Weight:  [75.7 kg] 75.7 kg (01/12 2048) Blood pressure (!) 149/87, pulse 98, temperature 98.1 F (36.7 C), temperature source Oral, resp. rate 18, height 5' 11 (1.803 m), weight 75.7 kg, SpO2 99%. Body mass index is 23.28 kg/m.  Physical Exam Vitals and nursing note reviewed.  Constitutional:      Appearance: Normal appearance.  HENT:     Head: Normocephalic and atraumatic.     Nose: Nose normal.     Mouth/Throat:     Pharynx: Oropharynx is clear.  Eyes:     Pupils: Pupils are equal, round, and reactive to light.  Pulmonary:     Effort: Pulmonary effort is normal.  Musculoskeletal:         General: Normal range of motion.     Cervical back: Normal range of motion.  Skin:    General: Skin is dry.  Neurological:     General: No focal deficit present.     Mental Status: He is oriented to person, place, and time.  Psychiatric:        Attention and Perception: Perception normal.        Mood and Affect: Mood is anxious and depressed.        Speech: Speech normal.        Behavior: Behavior is cooperative.        Thought Content: Thought content includes suicidal ideation. Thought content includes suicidal plan.        Cognition and Memory: Cognition is impaired.        Judgment: Judgment is impulsive and inappropriate.     Mental Status Exam: General Appearance: Casual  Orientation:  Full (Time, Place, and Person)  Memory:  Immediate;   Fair Recent;   Fair Remote;   Fair  Concentration:  Concentration: Fair and Attention Span: Fair  Recall:  Fair  Attention  Poor  Eye Contact:  Fair  Speech:  Clear and Coherent  Language:  Fair  Volume:  Normal  Mood: dysphoric  Affect:  Depressed  Thought Process:  Disorganized  Thought Content:  WDL  Suicidal Thoughts:  Yes.  with intent/plan  Homicidal Thoughts:  No  Judgement:  Impaired  Insight:  Fair  Psychomotor Activity:  Normal  Akathisia:  NA  Fund of Ethan:  Fair      Assets:  Manufacturing Systems Engineer Desire for Improvement Financial Resources/Insurance Housing Social Support  Cognition:  WNL  ADL's:  Intact  AIMS (if indicated):        Other History   These have been pulled in through the EMR, reviewed, and updated if appropriate.  Family History:  The patient's family history is not on file.  Medical History: Past Medical History:  Diagnosis Date   Schizophrenia [facility])     Surgical History: Past Surgical History:  Procedure Laterality Date   NO PAST SURGERIES       Medications:  No current facility-administered medications for this encounter.  Current Outpatient Medications:     ibuprofen  (ADVIL ) 200 MG tablet, Take 200 mg by mouth as needed for headache or mild pain (pain score 1-3)., Disp: , Rfl:    OLANZapine  (ZYPREXA ) 15 MG tablet, Take 1 tablet (15 mg total) by mouth at bedtime., Disp: 30 tablet, Rfl: 0   OLANZapine  (ZYPREXA ) 15 MG tablet, Take 1 tablet (15 mg total) by mouth at bedtime., Disp: 30 tablet, Rfl: 0   traZODone  (DESYREL ) 100 MG tablet, Take 1 tablet (100 mg total) by mouth at bedtime as needed for sleep., Disp: 7 tablet, Rfl: 0  Allergies: No Known Allergies  Zania Kalisz CHRISTELLA Fireman, NP

## 2023-02-09 NOTE — ED Notes (Signed)
TTS speaking with patient. 

## 2023-02-09 NOTE — ED Notes (Addendum)
 Pt changed in psych safe clothing.   Pt has Camera Operator Black dew rag Blue sock hat Blue jeans Green belt White boxers White socks Grey socks  Pt also has a blue and white book bag that has a plethora of things in like papers, food and a lap top.

## 2023-02-09 NOTE — ED Notes (Signed)
 Patient reports having si with plan to buy fentanyl and overdose.  States recently started new medication but doesn't feel that it is working and would like an adjustment.  Patient calm and cooperative at this time.

## 2023-02-09 NOTE — ED Provider Notes (Signed)
 [facility] Provider Note    Event Date/Time   First MD Initiated Contact with Patient 02/09/23 2111     (approximate)   History   Mental Health Problem   HPI  Ethan Sutton is a 38 y.o. male who presents to the emergency department today because of concerns for desire to overdose.  Patient does have history of suppression as well as schizophrenia.  Patient denies any recent medical  illness.     Physical Exam   Triage Vital Signs: ED Triage Vitals [02/09/23 2048]  Encounter Vitals Group     BP (!) 149/87     Systolic BP Percentile      Diastolic BP Percentile      Pulse Rate 98     Resp 18     Temp 98.1 F (36.7 C)     Temp Source Oral     SpO2 99 %     Weight 166 lb 14.2 oz (75.7 kg)     Height 5' 11 (1.803 m)     Head Circumference      Peak Flow      Pain Score 0     Pain Loc      Pain Education      Exclude from Growth Chart     Most recent vital signs: Vitals:   02/09/23 2048  BP: (!) 149/87  Pulse: 98  Resp: 18  Temp: 98.1 F (36.7 C)  SpO2: 99%   General: Awake, alert, oriented. CV:  Good peripheral perfusion.  Resp:  Normal effort.  Abd:  No distention.  Other:  Withdrawn.   ED Results / Procedures / Treatments   Labs (all labs ordered are listed, but only abnormal results are displayed) Labs Reviewed  COMPREHENSIVE METABOLIC PANEL - Abnormal; Notable for the following components:      Result Value   Total Protein 8.4 (*)    All other components within normal limits  ACETAMINOPHEN  LEVEL - Abnormal; Notable for the following components:   Acetaminophen  (Tylenol ), Serum <10 (*)    All other components within normal limits  SALICYLATE LEVEL - Abnormal; Notable for the following components:   Salicylate Lvl <7.0 (*)    All other components within normal limits  ETHANOL  URINE DRUG SCREEN, QUALITATIVE ([facility] ONLY)  CBC WITH DIFFERENTIAL/PLATELET      EKG  None   RADIOLOGY None   PROCEDURES:  Critical Care performed: No   MEDICATIONS ORDERED IN ED: Medications - No data to display   IMPRESSION / MDM / ASSESSMENT AND PLAN / ED COURSE  I reviewed the triage vital signs and the nursing notes.                              Differential diagnosis includes, but is not limited to, depression, drug induced mood disorder.  Patient's presentation is most consistent with acute presentation with potential threat to life or bodily function.   Patient presents to the emergency department today because of concerns for desire to overdose.  Patient does have a history of depression and schizophrenia.  Patient was seen by psychiatry and they do recommend inpatient admission.  The patient has been placed in psychiatric observation due to the need to provide a safe environment for the patient while obtaining psychiatric consultation and evaluation, as well as ongoing medical and medication management to treat the patient's condition.  The patient has not been placed under full  IVC at this time.    FINAL CLINICAL IMPRESSION(S) / ED DIAGNOSES   Final diagnoses:  Depression, unspecified depression type     Note:  This document was prepared using Dragon voice recognition software and may include unintentional dictation errors.    Floy Roberts, MD 02/09/23 2232

## 2023-02-10 ENCOUNTER — Encounter (HOSPITAL_COMMUNITY): Payer: Self-pay | Admitting: Psychiatry

## 2023-02-10 ENCOUNTER — Inpatient Hospital Stay (HOSPITAL_COMMUNITY)
Admission: AD | Admit: 2023-02-10 | Discharge: 2023-02-16 | DRG: 885 | Disposition: A | Discharge status: Home / Self Care | Payer: No Typology Code available for payment source | Source: Intra-hospital | Attending: Psychiatry | Admitting: Psychiatry

## 2023-02-10 DIAGNOSIS — Z87891 Personal history of nicotine dependence: Secondary | ICD-10-CM | POA: Diagnosis not present

## 2023-02-10 DIAGNOSIS — Z5901 Sheltered homelessness: Secondary | ICD-10-CM | POA: Diagnosis not present

## 2023-02-10 DIAGNOSIS — Z91148 Patient's other noncompliance with medication regimen for other reason: Secondary | ICD-10-CM | POA: Diagnosis not present

## 2023-02-10 DIAGNOSIS — F201 Disorganized schizophrenia: Secondary | ICD-10-CM | POA: Diagnosis not present

## 2023-02-10 DIAGNOSIS — F32A Depression, unspecified: Secondary | ICD-10-CM | POA: Diagnosis present

## 2023-02-10 DIAGNOSIS — F209 Schizophrenia, unspecified: Principal | ICD-10-CM | POA: Diagnosis present

## 2023-02-10 DIAGNOSIS — R45851 Suicidal ideations: Secondary | ICD-10-CM | POA: Diagnosis present

## 2023-02-10 DIAGNOSIS — Z818 Family history of other mental and behavioral disorders: Secondary | ICD-10-CM | POA: Diagnosis not present

## 2023-02-10 DIAGNOSIS — Z79899 Other long term (current) drug therapy: Secondary | ICD-10-CM

## 2023-02-10 MED ORDER — DIPHENHYDRAMINE HCL 50 MG/ML IJ SOLN: 50.0000 mg | Freq: Three times a day (TID) | INTRAMUSCULAR | Status: DC | PRN

## 2023-02-10 MED ORDER — OLANZAPINE 7.5 MG PO TABS
15.0000 mg | ORAL_TABLET | Freq: Every day | ORAL | Status: DC
  Administered 2023-02-10 – 2023-02-15 (×6): 15 mg via ORAL
  Filled 2023-02-10 (×2): qty 2
  Filled 2023-02-10: qty 14
  Filled 2023-02-10 (×3): qty 2
  Filled 2023-02-10: qty 6
  Filled 2023-02-10: qty 14
  Filled 2023-02-10 (×3): qty 2

## 2023-02-10 MED ORDER — HALOPERIDOL 5 MG PO TABS: 5.0000 mg | ORAL_TABLET | Freq: Three times a day (TID) | ORAL | Status: DC | PRN

## 2023-02-10 MED ORDER — TRAZODONE HCL 50 MG PO TABS
50.0000 mg | ORAL_TABLET | Freq: Every evening | ORAL | Status: DC | PRN
  Administered 2023-02-12 – 2023-02-13 (×2): 50 mg via ORAL
  Filled 2023-02-10 (×4): qty 1
  Filled 2023-02-10: qty 7
  Filled 2023-02-10 (×2): qty 1

## 2023-02-10 MED ORDER — LORAZEPAM 2 MG/ML IJ SOLN: 2.0000 mg | Freq: Three times a day (TID) | INTRAMUSCULAR | Status: DC | PRN

## 2023-02-10 MED ORDER — HALOPERIDOL LACTATE 5 MG/ML IJ SOLN: 10.0000 mg | Freq: Three times a day (TID) | INTRAMUSCULAR | Status: DC | PRN

## 2023-02-10 MED ORDER — HALOPERIDOL LACTATE 5 MG/ML IJ SOLN: 5.0000 mg | Freq: Three times a day (TID) | INTRAMUSCULAR | Status: DC | PRN

## 2023-02-10 MED ORDER — ACETAMINOPHEN 325 MG PO TABS
650.0000 mg | ORAL_TABLET | Freq: Four times a day (QID) | ORAL | Status: DC | PRN
  Administered 2023-02-13: 650 mg via ORAL
  Filled 2023-02-10: qty 2

## 2023-02-10 MED ORDER — ALUM & MAG HYDROXIDE-SIMETH 200-200-20 MG/5ML PO SUSP: 30.0000 mL | ORAL | Status: DC | PRN

## 2023-02-10 MED ORDER — HYDROXYZINE HCL 25 MG PO TABS
25.0000 mg | ORAL_TABLET | Freq: Three times a day (TID) | ORAL | Status: DC | PRN
  Filled 2023-02-10 (×2): qty 1

## 2023-02-10 MED ORDER — DIPHENHYDRAMINE HCL 25 MG PO CAPS: 50.0000 mg | ORAL_CAPSULE | Freq: Three times a day (TID) | ORAL | Status: DC | PRN

## 2023-02-10 MED ORDER — MAGNESIUM HYDROXIDE 400 MG/5ML PO SUSP: 30.0000 mL | Freq: Every day | ORAL | Status: DC | PRN

## 2023-02-10 NOTE — ED Notes (Signed)
 Transfer consent, voluntary admission consent and rider waiver signed and placed on pt clipboard. ED Secretary and [facility] notified.

## 2023-02-10 NOTE — Progress Notes (Signed)
   02/10/23 2042  Psych Admission Type (Psych Patients Only)  Admission Status Voluntary  Psychosocial Assessment  Patient Complaints Anxiety  Eye Contact Fair  Facial Expression Flat  Affect Depressed  Speech Logical/coherent  Interaction Assertive  Motor Activity Other (Comment) (WDL)  Appearance/Hygiene In scrubs  Behavior Characteristics Cooperative;Appropriate to situation  Mood Depressed;Anxious  Thought Process  Coherency WDL  Content WDL  Delusions None reported or observed  Perception WDL  Hallucination None reported or observed  Judgment WDL  Confusion WDL  Danger to Self  Current suicidal ideation? Denies  Agreement Not to Harm Self Yes  Description of Agreement verbal  Danger to Others  Danger to Others None reported or observed

## 2023-02-10 NOTE — ED Notes (Signed)
 Transferred to [facility] via wheelchair by EDT and security. Belongings sent to be locked up in B[facility] locker room by EDT.

## 2023-02-10 NOTE — [facility] Group Notes (Signed)
 Pt did not attend AA meeting group

## 2023-02-10 NOTE — Plan of Care (Signed)
   Problem: Education: Goal: Emotional status will improve Outcome: Not Progressing Goal: Mental status will improve Outcome: Not Progressing

## 2023-02-10 NOTE — ED Notes (Signed)
 SAFE  TRANSPORT  CALLED  FOR  PT  GOING TO  [facility]

## 2023-02-10 NOTE — ED Notes (Signed)
 This tech offered pt a shower. Pt refused a shower at this time.

## 2023-02-10 NOTE — BH Assessment (Signed)
 Patient has been accepted to [facility] on today 02/10/23. Patient assigned to room 400, bed# 1.  Accepting physician is Dr. Evelena.  Call report to [phone].  Representative was [employer].   ER Staff is aware of it:  Olam, ER Secretary  Dr. Waymond, ER MD  Arletta, Patient's Nurse     Writer attempted to reach patient's family/support system Vernia Spinella(mom) [phone]). Left message on voicemail to return call.

## 2023-02-10 NOTE — ED Provider Notes (Signed)
 Emergency Medicine Observation Re-evaluation Note  Ethan Sutton is a 38 y.o. male, seen on rounds today.  Pt initially presented to the ED for complaints of Mental Health Problem Currently, the patient is resting, voices no medical complaints.  Physical Exam  BP (!) 149/87 (BP Location: Left Arm)   Pulse 98   Temp 98.1 F (36.7 C) (Oral)   Resp 18   Ht 5' 11 (1.803 m)   Wt 75.7 kg   SpO2 99%   BMI 23.28 kg/m  Physical Exam General: Resting in no acute distress Cardiac: No cyanosis Lungs: Equal rise and fall Psych: Not agitated  ED Course / MDM  EKG:   I have reviewed the labs performed to date as well as medications administered while in observation.  Recent changes in the last 24 hours include no events overnight.  Plan  Current plan is for psychiatric disposition.    Wrenn Willcox J, MD 02/10/23 [phone]

## 2023-02-10 NOTE — ED Notes (Signed)
 VOL/We recommend inpatient psychiatric hospitalization when medically cleared

## 2023-02-10 NOTE — Progress Notes (Signed)
 Pt admitted at this time. Pt presents with irritable affect and congruent mood. Pt stating he has concerns staff is going to take his belongings due to having that happen in the past. Staff explained protocol and inventoried pts belongings with him present. Pt endorses suicidal thoughts with a plan to overdose on fentanyl. Pt states he has not been taking medication and needs to find a medication to help him. Pt lives at home with his mother due to being on parole. Pt denies AVH as well as HI.

## 2023-02-10 NOTE — Plan of Care (Signed)
  Problem: Education: Goal: Knowledge of [place] [facility] Education information/materials will improve Outcome: Not Progressing Goal: Emotional status will improve Outcome: Not Progressing Goal: Mental status will improve Outcome: Not Progressing Goal: Verbalization of understanding the information provided will improve Outcome: Not Progressing   Problem: Activity: Goal: Interest or engagement in activities will improve Outcome: Not Progressing Goal: Sleeping patterns will improve Outcome: Not Progressing   Problem: Coping: Goal: Ability to verbalize frustrations and anger appropriately will improve Outcome: Not Progressing Goal: Ability to demonstrate self-control will improve Outcome: Not Progressing   Problem: Health Behavior/Discharge Planning: Goal: Identification of resources available to assist in meeting health care needs will improve Outcome: Not Progressing Goal: Compliance with treatment plan for underlying cause of condition will improve Outcome: Not Progressing   Problem: Physical Regulation: Goal: Ability to maintain clinical measurements within normal limits will improve Outcome: Not Progressing   Problem: Safety: Goal: Periods of time without injury will increase Outcome: Not Progressing   Problem: Education: Goal: Ability to make informed decisions regarding treatment will improve Outcome: Not Progressing   Problem: Coping: Goal: Coping ability will improve Outcome: Not Progressing   Problem: Health Behavior/Discharge Planning: Goal: Identification of resources available to assist in meeting health care needs will improve Outcome: Not Progressing   Problem: Medication: Goal: Compliance with prescribed medication regimen will improve Outcome: Not Progressing   Problem: Self-Concept: Goal: Ability to disclose and discuss suicidal ideas will improve Outcome: Not Progressing Goal: Will verbalize positive feelings about self Outcome: Not  Progressing Note: Patient is initiating therapy. Patient will work on increased adherence    Problem: Activity: Goal: Will identify at least one activity in which they can participate Outcome: Not Progressing   Problem: Coping: Goal: Ability to identify and develop effective coping behavior will improve Outcome: Not Progressing Goal: Ability to interact with others will improve Outcome: Not Progressing Goal: Demonstration of participation in decision-making regarding own care will improve Outcome: Not Progressing Goal: Ability to use eye contact when communicating with others will improve Outcome: Not Progressing   Problem: Health Behavior/Discharge Planning: Goal: Identification of resources available to assist in meeting health care needs will improve Outcome: Not Progressing   Problem: Self-Concept: Goal: Will verbalize positive feelings about self Outcome: Not Progressing

## 2023-02-10 NOTE — ED Notes (Signed)
VOL    MOVED  TO  BHU  

## 2023-02-10 NOTE — Consult Note (Signed)
 Pt is being reviewed for inpatient psychiatric admission at [facility]. Briefly met with pt. Pt reports presenting to the emergency department for suicidal ideations w/ plans to overdose on fentanyl. 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 He denies homicidal ideations, auditory visual hallucinations or paranoia. He is agreeable to inpatient psychiatric admission at this time.

## 2023-02-11 DIAGNOSIS — F201 Disorganized schizophrenia: Secondary | ICD-10-CM | POA: Diagnosis not present

## 2023-02-11 MED ORDER — IBUPROFEN 600 MG PO TABS
600.0000 mg | ORAL_TABLET | Freq: Four times a day (QID) | ORAL | Status: DC | PRN
  Administered 2023-02-12 – 2023-02-14 (×2): 600 mg via ORAL
  Filled 2023-02-11 (×2): qty 1

## 2023-02-11 NOTE — [facility] Suicide Risk Assessment (Signed)
 Suicide Risk Assessment  Admission Assessment    [facility] Admission Suicide Risk Assessment   Nursing information obtained from:  Patient Demographic factors:  Male Current Mental Status:  Suicidal ideation indicated by patient Loss Factors:  Legal issues Historical Factors:  NA Risk Reduction Factors:  Living with another person, especially a relative  Total Time spent with patient: 45 minutes Principal Problem: Schizophrenia (HCC) Diagnosis:  Principal Problem:   Schizophrenia (HCC)  Ethan Sutton is a 38 y.o. male  with a past psychiatric history of schizophrenia. Patient initially arrived to [facility] on 02/09/23 for suicidal thoughts with plan to OD on fentanyl, and admitted to [facility] voluntarily on 02/10/23 for acute safety concerns, acute suicidal or self-harming behaviors, and stabilization of acute on chronic psychiatric conditions. PMHx is significant for chronic pain.    Collateral Information, Mother, [phone]: she denies that he has been taking medication. Has not followd up with . He has been acting inappropriately over the last few days including memory doesn't work, does listen, does whatever he wants to do. He smokes tobacco and leaves his tobacco everywhere and when asked to move his tobacco. Reports that he is responding to voices. Reports taht he is paranoid and blames others for what he is experiencing. He gets aggressive. Reports that he has been staying in her house. She reports that he cannot come back to the house and that he needs to stay in a shelter to learn. When on medication, she does not notice a difference. Reports that younger son is diagnosed with schizophrenia and has aggression and lives in group home, and the older brother has severe anxiety.    HPI:  Patient is inconsistent historian and provides different answers throughout interview that contradict previous information.   Patient reports that he is here because his medication (olanzapine ) is not been working.   When asked him to clarify he is unable to find a reason for the statement or more clarifying responses.  Discussed if they have been treating the symptoms that have been causing issues, and he is unable to identify symptoms other than pain.  When prompted on issues he may be having including hallucinations, depression, sleep, etc. he endorses sleep but is unable to endorse other reasons for medications being helpful or not helpful.  When asked if he is having side effects, he reports that he is tolerating the medicine okay.  Discussed if he is having any excessive sedation and he reports no because he takes it at night.  Reports that the olanzapine  helps with his sleep at night.  He does not report experiencing any auditory hallucinations, visualizations, paranoia, or other issues related to schizophrenia.     On review of systems, patient denies experiencing any depressive symptoms.  However on PHQ patient endorses having anhedonia, depressed mood, little energy, worthlessness, lack concentration, and psychomotor retardation.  He currently is denying suicidal thoughts.  When asked if he was experiencing suicidal thoughts prior to admission, he denies suicidal thoughts but does endorse wanting to overdose on fentanyl.  However, he denies using fentanyl anytime recently but does report remote history of use and that he still has access to it.  He does report current use of alcohol but on further questioning reports that he barely uses it drinking 1 drink a day every few days.  Again the interview he reports that he uses alcohol to fall asleep and that he can drink it whenever.  He denies no other substance use.  Patient endorses being anxious.  He denies experiencing hallucinations or paranoia.  He denies any past trauma.  He has permission for us  to reach out to his mother.  Continued Clinical Symptoms:  Alcohol Use Disorder Identification Test Final Score (AUDIT): 3 The Alcohol Use Disorders  Identification Test, Guidelines for Use in Primary Care, Second Edition.  World Science Writer [facility]). Score between 0-7:  no or low risk or alcohol related problems. Score between 8-15:  moderate risk of alcohol related problems. Score between 16-19:  high risk of alcohol related problems. Score 20 or above:  warrants further diagnostic evaluation for alcohol dependence and treatment.   CLINICAL FACTORS:   Schizophrenia:   Depressive state Less than 19 years old   Musculoskeletal: Strength & Muscle Tone: within normal limits Gait & Station: normal Patient leans: N/A  Psychiatric Specialty Exam:  Presentation  General Appearance:  Appropriate for Environment  Eye Contact: Fair  Speech: Clear and Coherent  Speech Volume: Normal  Handedness: Right   Mood and Affect  Mood: Anxious  Affect: Congruent   Thought Process  Thought Processes: Coherent  Descriptions of Associations:Intact  Orientation:Full (Time, Place and Person)  Thought Content:WDL  History of Schizophrenia/Schizoaffective disorder:Yes  Duration of Psychotic Symptoms:No data recorded Hallucinations:No data recorded Ideas of Reference:None  Suicidal Thoughts:No data recorded Homicidal Thoughts:No data recorded  Sensorium  Memory: Immediate Fair  Judgment: Fair  Insight: Fair   Art Therapist  Concentration: Fair  Attention Span: Fair  Recall: [employer] of Knowledge: Fair  Language: Fair   Psychomotor Activity  Psychomotor Activity:No data recorded  Assets  Assets: Desire for Improvement; Resilience; Social Support   Sleep  Sleep:No data recorded   Physical Exam: Physical Exam Vitals and nursing note reviewed.  HENT:     Head: Normocephalic and atraumatic.  Pulmonary:     Effort: Pulmonary effort is normal.  Neurological:     General: No focal deficit present.     Mental Status: He is alert.  Psychiatric:     Comments: No obvious EPS.     Review of Systems  Constitutional:  Negative for fever.  Cardiovascular:  Negative for chest pain and palpitations.  Gastrointestinal:  Negative for constipation, diarrhea, nausea and vomiting.  Neurological:  Negative for dizziness, weakness and headaches.  Psychiatric/Behavioral:         Pt denies extrapyramidal symptoms including dystonia (sudden spastic contractions of muscle groups), parkinsonism (bradykinesia, tremors, rigidity), and akathisia (severe restlessness).    Blood pressure 100/74, pulse 77, temperature 97.8 F (36.6 C), temperature source Oral, resp. rate 16, height 5' 11 (1.803 m), weight 74.8 kg, SpO2 100%. Body mass index is 23.01 kg/m.   COGNITIVE FEATURES THAT CONTRIBUTE TO RISK:  Loss of executive function    SUICIDE RISK:   Severe:  Frequent, intense, and enduring suicidal ideation, specific plan, no subjective intent, but some objective markers of intent (i.e., choice of lethal method), the method is accessible, some limited preparatory behavior, evidence of impaired self-control, severe dysphoria/symptomatology, multiple risk factors present, and few if any protective factors, particularly a lack of social support.  ASSESSMENT & PLAN   ASSESSMENT:   Diagnoses / Active Problems: Schizophrenia (HCC)   Ethan Sutton is a 38 y.o. male with past psychiatric history of schizophrenia and several recent hospitalizations followed by medication nonadherence who presents with suicidal thoughts and psychosis.  Patient is unable to provide a understandable history and has some inconsistencies in his reporting including medication use and symptoms he is experiencing.  Per mother patient  has been appearing to respond to voices, paranoid and becoming aggressive, and has been generally disruptive when living at home with her.  There has been some concern for malingering the past although there is no clear secondary gain which would justify malingering.  Given the evidence  from the mother, chart review, the (patient we will restart olanzapine  which patient reports that he has tolerated without side effects and found benefit from.  Plan to monitor for symptoms of psychosis and response to medication.  Per mother patient cannot go back home and patient will likely discharge to shelter, but we will revisit this discussion mother and patient is open to options for medication adherence including long-acting injectables.   PLAN: Safety and Monitoring:             -- Voluntary admission to inpatient psychiatric unit for safety, stabilization and treatment             -- Daily contact with patient to assess and evaluate symptoms and progress in treatment             -- Patient's case to be discussed in multi-disciplinary team meeting             -- Observation Level : q15 minute checks             -- Vital signs:  q12 hours             -- Precautions: suicide, elopement, and assault   2. Psychiatric Diagnoses and Treatment:  -- Continue olanzapine  50 mg once nightly for schizophrenia             -- Continue trazodone  50 mg once nightly as needed for insomnia             -- Continue hydroxyzine  25 mg 3 times daily as needed for anxiety   --  The risks/benefits/side-effects/alternatives to this medication were discussed in detail with the patient and time was given for questions. The patient consents to medication trial.              -- Metabolic profile and EKG monitoring obtained while on an atypical antipsychotic. See #4 below for values.              -- Encouraged patient to participate in unit milieu and in scheduled group therapies              -- Short Term Goals: Ability to identify changes in lifestyle to reduce recurrence of condition will improve, Ability to verbalize feelings will improve, Ability to disclose and discuss suicidal ideas, Ability to demonstrate self-control will improve, Ability to identify and develop effective coping behaviors will improve,  Ability to maintain clinical measurements within normal limits will improve, Compliance with prescribed medications will improve, and Ability to identify triggers associated with substance abuse/mental health issues will improve             -- Long Term Goals: Improvement in symptoms so as ready for discharge                3. Medical Issues Being Addressed:              -- knee pain: continue tylenol , add ibuprofen                -- Continue PRN's: Tylenol , Maalox, Milk of Magnesia     4. Routine and other pertinent labs reviewed: EKG monitoring: QTc: 397 in Dec, repeating 02/11/23   Metabolism / endocrine: BMI:  Body mass index is 23.01 kg/m. Prolactin: Recent Labs  No results found for: PROLACTIN   Lipid Panel: Recent Labs  No results found for: CHOL, TRIG, HDL, CHOLHDL, VLDL, LDLCALC   HbgA1c: Last Labs  No results found for: HGBA1C   TSH: Last Labs  No results found for: TSH     Labs to order: lipid panel, A1c, TSH, EKG   5. Discharge Planning:              -- Social work and case management to assist with discharge planning and identification of hospital follow-up needs prior to discharge             -- Estimated LOS: 02/18/23             -- Discharge Concerns: Need to establish a safety plan; Medication compliance and effectiveness             -- Discharge Goals: Return home with outpatient referrals for mental health follow-up including medication management/psychotherapy     I certify that inpatient services furnished can reasonably be expected to improve the patient's condition.   This note was created using a voice recognition software as a result there may be grammatical errors inadvertently enclosed that do not reflect the nature of this encounter. Every attempt is made to correct such errors.   Justino Cornish, MD 02/11/2023, 2:45 PM

## 2023-02-11 NOTE — [facility] Counselor (Signed)
 Adult Comprehensive Assessment  Patient ID: Ethan Sutton, male   DOB: 1985-12-26, 38 y.o.   MRN: [id number]  Information Source: Information source: Patient  Current Stressors:  Patient states their primary concerns and needs for treatment are:: My meds weren't working good for me Patient states their goals for this hospitilization and ongoing recovery are:: Adjust my medications because they weren't working, I need better sleep and I have been in pain Educational / Learning stressors: None reported Employment / Job issues: None reported Family Relationships: None reported Surveyor, Quantity / Lack of resources (include bankruptcy): None reported Housing / Lack of housing: None reported Physical health (include injuries & life threatening diseases): None reported Social relationships: None reported Substance abuse: None reported Bereavement / Loss: None reported  Living/Environment/Situation:  Living Arrangements: Parent Living conditions (as described by patient or guardian): House Who else lives in the home?: Mom, dad, brother How long has patient lived in current situation?: A while What is atmosphere in current home: Comfortable, Paramedic, Supportive  Family History:  Marital status: Single Are you sexually active?: No What is your sexual orientation?: don't know Has your sexual activity been affected by drugs, alcohol, medication, or emotional stress?: Pt denies. Does patient have children?: No  Childhood History:  By whom was/is the patient raised?: Mother Description of patient's relationship with caregiver when they were a child: It was good Patient's description of current relationship with people who raised him/her: It's good How were you disciplined when you got in trouble as a child/adolescent?: whooped Does patient have siblings?: Yes Number of Siblings: 2 Description of patient's current relationship with siblings: Good Did patient suffer any  verbal/emotional/physical/sexual abuse as a child?: No Did patient suffer from severe childhood neglect?: No Has patient ever been sexually abused/assaulted/raped as an adolescent or adult?: No Was the patient ever a victim of a crime or a disaster?: No Witnessed domestic violence?: No Has patient been affected by domestic violence as an adult?: No  Education:  Highest grade of school patient has completed: 11th Currently a student?: No Learning disability?: No  Employment/Work Situation:   Employment Situation: Unemployed Patient's Job has Been Impacted by Current Illness: No What is the Longest Time Patient has Held a Job?: 8 years Where was the Patient Employed at that Time?: [employer] Has Patient ever Been in the [employer]?: No  Financial Resources:   Financial resources: No income Does patient have a lawyer or guardian?: No  Alcohol/Substance Abuse:   What has been your use of drugs/alcohol within the last 12 months?: None reported If attempted suicide, did drugs/alcohol play a role in this?: No Alcohol/Substance Abuse Treatment Hx: Denies past history Has alcohol/substance abuse ever caused legal problems?: No  Social Support System:   Conservation Officer, Nature Support System: Good Describe Community Support System: Family and everyone Type of faith/religion: Not really How does patient's faith help to cope with current illness?: None reported  Leisure/Recreation:   Do You Have Hobbies?: No  Strengths/Needs:   What is the patient's perception of their strengths?: Good listener Patient states they can use these personal strengths during their treatment to contribute to their recovery: none reported Patient states these barriers may affect/interfere with their treatment: None reported Patient states these barriers may affect their return to the community: None reported Other important information patient would like considered in planning for their treatment: I  don't know  Discharge Plan:   Currently receiving community mental health services: No Patient states concerns and preferences for aftercare planning  are: I don't see anyone on the outside for my meds Patient states they will know when they are safe and ready for discharge when: No clue Does patient have access to transportation?: No Does patient have financial barriers related to discharge medications?: No Plan for no access to transportation at discharge: CSW to arrange as needed Will patient be returning to same living situation after discharge?: Yes  Summary/Recommendations:   Summary and Recommendations (to be completed by the evaluator): Ethan Sutton is a 38 year old male voluntarily admitted to [facility] from [facility] department. Ethan Sutton reports that he started new medications and they are not working for him which led to [facility] with a plan to buy and overdose on fentanyl. Pt was recently discharged from [facility] last month  and reports that he has not seen a psychiatrist or a therapist since discharge but is okay with getting appointments at discharge. Pt reports no substance or alcohol use. Pt was minima in conversation during assessment, and began to walk out of dayroom halfway through but was able to be redirected to compete assessment. Denies AVH, SI and HI currently. Pt lives at home with his mom, dad and brother and plans to return there at discharge. While here, Ethan Sutton can benefit from crisis stabilization, medication management, therapeutic milieu, and referrals for services.   Ethan Sutton. 02/11/2023

## 2023-02-11 NOTE — Group Note (Signed)
 LCSW Group Therapy Note   Group Date: 02/11/2023 Start Time: 1100 End Time: 1200  Type of Therapy and Topic:  Group Therapy: What is Anxiety?  Participation Level:  Did not attend.  Description of Group:  This group involves discussing what anxiety is, the symptoms that may present with anxiety, different types of anxiety, how anxiety can manifest in different areas of our lives, and how anxiety is commonly treated. After the discussion around anxiety with these different components, individuals will fill out a worksheet Introduction to Anxiety and explore how anxiety shows up for them. After completion of the worksheet, the group will discuss responses and provide feedback to one another.   Therapeutic Goals: Developing effective coping mechanisms, gaining awareness of anxiety triggers, improving stress management skills, enhancing daily functioning, and building a supportive community by allowing individuals to share experiences and learn from others facing similar challenges   Summary of Patient Progress:  Did not attend.  Therapeutic Modalities:  CBT  Jenkins LULLA Primer, LCSWA 02/11/2023  1:14 PM

## 2023-02-11 NOTE — Progress Notes (Addendum)
 Patient refused ordered EKG despite encouragement from this writer reporting "nah, i've already done that I am not doing it again".

## 2023-02-11 NOTE — Progress Notes (Addendum)
 Pt denied SI/HI/AVH this morning. Pt denied having anxiety or depression this morning. Pt presents with a depressed affect and appears to be suspicious and guarded during interactions with this clinical research associate. Pt has been calm and cooperative throughout the shift. Pt given scheduled medications as prescribed. Q15 min checks verified for safety. Patient verbally contracts for safety. Patient compliant with medications and treatment plan. Patient is interacting well on the unit. Pt is safe on the unit.   02/11/23 0800  Psych Admission Type (Psych Patients Only)  Admission Status Voluntary  Psychosocial Assessment  Patient Complaints Suspiciousness  Eye Contact Brief;Suspiciousness  Facial Expression Flat;Blank  Affect Depressed  Speech Soft  Interaction Guarded  Motor Activity Slow  Appearance/Hygiene Unremarkable  Behavior Characteristics Cooperative  Mood Depressed;Suspicious  Thought Process  Coherency WDL  Content WDL  Delusions None reported or observed  Perception WDL  Hallucination None reported or observed  Judgment Impaired  Confusion None  Danger to Self  Current suicidal ideation? Denies  Agreement Not to Harm Self Yes  Description of Agreement Verbal  Danger to Others  Danger to Others None reported or observed

## 2023-02-11 NOTE — Group Note (Signed)
 Recreation Therapy Group Note   Group Topic:Animal Assisted Therapy   Group Date: 02/11/2023 Start Time: 0935 End Time: 1030 Facilitators: Brandis Matsuura-McCall, LRT,CTRS Location: [address] Dayroom  Animal-Assisted Activity (AAA) Program Checklist/Progress Notes Patient Eligibility Criteria Checklist & Daily Group note for Rec Tx Intervention  AAA/T Program Assumption of Risk Form signed by Patient/ or Parent Legal Guardian Yes  Patient understands his/her participation is voluntary Yes  Education: Charity Fundraiser, Appropriate Animal Interaction   Education Outcome: Acknowledges education.    Affect/Mood: N/A   Participation Level: Did not attend    Clinical Observations/Individualized Feedback:      Plan: Continue to engage patient in RT group sessions 2-3x/week.   Kayn Haymore-McCall, LRT,CTRS  02/11/2023 12:56 PM
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
## 2023-02-11 NOTE — [facility] Group Notes (Signed)
 Psychoeducational Group Note  Date:  02/11/2023 Time:  2000  Group Topic/Focus:  Wrap up group  Participation Level: Did Not Attend  Participation Quality:  Not Applicable  Affect:  Not Applicable  Cognitive:  Not Applicable  Insight:  Not Applicable  Engagement in Group: Not Applicable  Additional Comments:  Did not attend.   Lenora Manuelita RAMAN 02/11/2023, 9:39 PM

## 2023-02-11 NOTE — Progress Notes (Signed)
   02/11/23 0545  15 Minute Checks  Location Bedroom  Visual Appearance Calm  Behavior Sleeping  Sleep (Behavioral Health Patients Only)  Calculate sleep? (Click Yes once per 24 hr at 0600 safety check) Yes  Documented sleep last 24 hours 7.5

## 2023-02-11 NOTE — Plan of Care (Signed)
   Problem: Education: Goal: Emotional status will improve Outcome: Progressing Goal: Mental status will improve Outcome: Progressing   Problem: Activity: Goal: Interest or engagement in activities will improve Outcome: Progressing Goal: Sleeping patterns will improve Outcome: Progressing

## 2023-02-12 ENCOUNTER — Encounter (HOSPITAL_COMMUNITY): Payer: Self-pay

## 2023-02-12 DIAGNOSIS — F201 Disorganized schizophrenia: Secondary | ICD-10-CM | POA: Diagnosis not present

## 2023-02-12 NOTE — BHH Group Notes (Signed)

## 2023-02-12 NOTE — Progress Notes (Signed)
   02/12/23 0800  Psych Admission Type (Psych Patients Only)  Admission Status Voluntary  Psychosocial Assessment  Patient Complaints Anxiety  Eye Contact Fair  Facial Expression Flat  Affect Anxious  Speech Soft  Interaction Cautious  Motor Activity Slow  Appearance/Hygiene In scrubs  Behavior Characteristics Appropriate to situation  Mood Suspicious;Anxious  Thought Process  Coherency WDL  Content WDL  Delusions None reported or observed  Perception WDL  Hallucination None reported or observed  Judgment Impaired  Confusion None  Danger to Self  Current suicidal ideation? Denies  Agreement Not to Harm Self Yes  Description of Agreement Verbal  Danger to Others  Danger to Others None reported or observed

## 2023-02-12 NOTE — [facility] Group Notes (Signed)
 [facility] Group Notes:  (Nursing/MHT/Case Management/Adjunct)  Date:  02/12/2023  Time:  2000  Type of Therapy:   Narcotics Anonymous Meeting   Participation Level:  Active  Participation Quality:  Appropriate, Attentive, and Supportive  Affect:  Anxious  Cognitive:  Alert  Insight:  Improving  Engagement in Group:  Developing/Improving  Modes of Intervention:  Clarification, Education, and Support  Summary of Progress/Problems:   Ethan Sutton 02/12/2023, 8:39 PM

## 2023-02-12 NOTE — Progress Notes (Signed)
   02/11/23 2038  Psych Admission Type (Psych Patients Only)  Admission Status Voluntary  Psychosocial Assessment  Patient Complaints Anxiety  Eye Contact Fair  Facial Expression Flat;Blank  Affect Anxious  Speech Soft  Interaction Cautious  Motor Activity Slow  Appearance/Hygiene In scrubs  Behavior Characteristics Appropriate to situation;Cooperative  Mood Suspicious;Anxious  Thought Process  Coherency WDL  Content WDL  Delusions None reported or observed  Perception WDL  Hallucination None reported or observed  Judgment Impaired  Confusion None  Danger to Self  Current suicidal ideation? Denies  Agreement Not to Harm Self Yes  Description of Agreement verbal  Danger to Others  Danger to Others None reported or observed

## 2023-02-12 NOTE — Group Note (Signed)
 Date:  02/12/2023 Time:  12:52 PM  Group Topic/Focus:  Goals Group:   The focus of this group is to help patients establish daily goals to achieve during treatment and discuss how the patient can incorporate goal setting into their daily lives to aide in recovery. Orientation:   The focus of this group is to educate the patient on the purpose and policies of crisis stabilization and provide a format to answer questions about their admission.  The group details unit policies and expectations of patients while admitted.     Participation Level:  Did Not Attend  Participation Quality:   n/a  Affect:   n/a  Cognitive:   n/a  Insight: None  Engagement in Group:   n/a  Modes of Intervention:   n/a  Additional Comments:   Pt did not attend.  Shade Darby Kahlel Peake 02/12/2023, 12:52 PM

## 2023-02-12 NOTE — Group Note (Signed)
 Recreation Therapy Group Note   Group Topic:Team Building  Group Date: 02/12/2023 Start Time: 0935 End Time: 1005 Facilitators: Justiss Gerbino-McCall, LRT,CTRS Location: [address] Dayroom   Group Topic: Communication, Team Building, Problem Solving  Goal Area(s) Addresses:  Patient will effectively work with peer towards shared goal.  Patient will identify skills used to make activity successful.  Patient will identify how skills used during activity can be used to reach post d/c goals.   Intervention: STEM Activity  Activity: Straw Bridge. In teams of 3-5, patients were given 15 plastic drinking straws and an equal length of masking tape. Using the materials provided, patients were instructed to build a free standing bridge-like structure to suspend an everyday item (ex: puzzle box) off of the floor or table surface. All materials were required to be used by the team in their design. LRT facilitated post-activity discussion reviewing team process. Patients were encouraged to reflect how the skills used in this activity can be generalized to daily life post discharge.   Education: Pharmacist, community, Scientist, physiological, Discharge Planning   Education Outcome: Acknowledges education/In group clarification offered/Needs additional education.    Affect/Mood: N/A   Participation Level: Did not attend    Clinical Observations/Individualized Feedback:     Plan: Continue to engage patient in RT group sessions 2-3x/week.   Elissia Spiewak-McCall, LRT,CTRS 02/12/2023 12:10 PM

## 2023-02-12 NOTE — BH IP Treatment Plan (Signed)
 Interdisciplinary Treatment and Diagnostic Plan Update  02/12/2023 Time of Session:  10:30 AM Caid Henline MRN: [id number]  Principal Diagnosis: Schizophrenia [facility])  Secondary Diagnoses: Principal Problem:   Schizophrenia (HCC)   Current Medications:  Current Facility-Administered Medications  Medication Dose Route Frequency Provider Last Rate Last Admin   acetaminophen  (TYLENOL ) tablet 650 mg  650 mg Oral Q6H PRN Lee, Jacqueline Eun, NP       alum & mag hydroxide-simeth (MAALOX/MYLANTA) 200-200-20 MG/5ML suspension 30 mL  30 mL Oral Q4H PRN Lee, Jacqueline Eun, NP       haloperidol  (HALDOL ) tablet 5 mg  5 mg Oral TID PRN Lee, Jacqueline Eun, NP       And   diphenhydrAMINE  (BENADRYL ) capsule 50 mg  50 mg Oral TID PRN Lee, Jacqueline Eun, NP       haloperidol  lactate (HALDOL ) injection 5 mg  5 mg Intramuscular TID PRN Lee, Jacqueline Eun, NP       And   diphenhydrAMINE  (BENADRYL ) injection 50 mg  50 mg Intramuscular TID PRN Lee, Jacqueline Eun, NP       And   LORazepam  (ATIVAN ) injection 2 mg  2 mg Intramuscular TID PRN Lee, Jacqueline Eun, NP       haloperidol  lactate (HALDOL ) injection 10 mg  10 mg Intramuscular TID PRN Lee, Jacqueline Eun, NP       And   diphenhydrAMINE  (BENADRYL ) injection 50 mg  50 mg Intramuscular TID PRN Lee, Jacqueline Eun, NP       And   LORazepam  (ATIVAN ) injection 2 mg  2 mg Intramuscular TID PRN Lee, Jacqueline Eun, NP       hydrOXYzine  (ATARAX ) tablet 25 mg  25 mg Oral TID PRN Lee, Jacqueline Eun, NP       ibuprofen  (ADVIL ) tablet 600 mg  600 mg Oral Q6H PRN McCarty, Artie, MD       magnesium  hydroxide (MILK OF MAGNESIA) suspension 30 mL  30 mL Oral Daily PRN Lee, Jacqueline Eun, NP       OLANZapine  (ZYPREXA ) tablet 15 mg  15 mg Oral QHS Lee, Jacqueline Eun, NP   15 mg at 02/11/23 2102   traZODone  (DESYREL ) tablet 50 mg  50 mg Oral QHS PRN Lee, Jacqueline Eun, NP       PTA Medications: Medications Prior to Admission  Medication Sig Dispense Refill  Last Dose/Taking   ibuprofen  (ADVIL ) 200 MG tablet Take 200 mg by mouth as needed for headache or mild pain (pain score 1-3).      OLANZapine  (ZYPREXA ) 15 MG tablet Take 1 tablet (15 mg total) by mouth at bedtime. 30 tablet 0    traZODone  (DESYREL ) 100 MG tablet Take 1 tablet (100 mg total) by mouth at bedtime as needed for sleep. 7 tablet 0     Patient Stressors:    Patient Strengths:    Treatment Modalities: Medication Management, Group therapy, Case management,  1 to 1 session with clinician, Psychoeducation, Recreational therapy.   Physician Treatment Plan for Primary Diagnosis: Schizophrenia (HCC) Long Term Goal(s):     Short Term Goals:    Medication Management: Evaluate patient's response, side effects, and tolerance of medication regimen.  Therapeutic Interventions: 1 to 1 sessions, Unit Group sessions and Medication administration.  Evaluation of Outcomes: Not Progressing  Physician Treatment Plan for Secondary Diagnosis: Principal Problem:   Schizophrenia (HCC)  Long Term Goal(s):     Short Term Goals:       Medication Management: Evaluate patient's response, side effects,  and tolerance of medication regimen.  Therapeutic Interventions: 1 to 1 sessions, Unit Group sessions and Medication administration.  Evaluation of Outcomes: Not Progressing   RN Treatment Plan for Primary Diagnosis: Schizophrenia (HCC) Long Term Goal(s): Knowledge of disease and therapeutic regimen to maintain health will improve  Short Term Goals: Ability to remain free from injury will improve, Ability to verbalize frustration and anger appropriately will improve, Ability to demonstrate self-control, Ability to participate in decision making will improve, Ability to verbalize feelings will improve, Ability to disclose and discuss suicidal ideas, Ability to identify and develop effective coping behaviors will improve, and Compliance with prescribed medications will improve  Medication  Management: RN will administer medications as ordered by provider, will assess and evaluate patient's response and provide education to patient for prescribed medication. RN will report any adverse and/or side effects to prescribing provider.  Therapeutic Interventions: 1 on 1 counseling sessions, Psychoeducation, Medication administration, Evaluate responses to treatment, Monitor vital signs and CBGs as ordered, Perform/monitor CIWA, COWS, AIMS and Fall Risk screenings as ordered, Perform wound care treatments as ordered.  Evaluation of Outcomes: Not Progressing   LCSW Treatment Plan for Primary Diagnosis: Schizophrenia (HCC) Long Term Goal(s): Safe transition to appropriate next level of care at discharge, Engage patient in therapeutic group addressing interpersonal concerns.  Short Term Goals: Engage patient in aftercare planning with referrals and resources, Increase social support, Increase ability to appropriately verbalize feelings, Increase emotional regulation, Facilitate acceptance of mental health diagnosis and concerns, Facilitate patient progression through stages of change regarding substance use diagnoses and concerns, Identify triggers associated with mental health/substance abuse issues, and Increase skills for wellness and recovery  Therapeutic Interventions: Assess for all discharge needs, 1 to 1 time with Social worker, Explore available resources and support systems, Assess for adequacy in community support network, Educate family and significant other(s) on suicide prevention, Complete Psychosocial Assessment, Interpersonal group therapy.  Evaluation of Outcomes: Not Progressing   Progress in Treatment: Attending groups: No. Participating in groups: No. Taking medication as prescribed: patient was just admitted to the hospital Toleration medication:  patient was just admitted to the hospital Family/Significant other contact made: No, will contact:  Dempsey Mcsween (mom)  [phone] Patient understands diagnosis: Yes. Discussing patient identified problems/goals with staff: Yes. Medical problems stabilized or resolved: Yes. Denies suicidal/homicidal ideation: Yes. and No. Issues/concerns per patient self-inventory: No.  New problem(s) identified: No   New Short Term/Long Term Goal(s):     medication stabilization, elimination of SI thoughts, development of comprehensive mental wellness plan.   Patient Goals:  "get better, feel better with my thoughts of hurting myself."  Patient said that he needs a referral for medication management and therapy.  Discharge Plan or Barriers:  Patient recently admitted. CSW will continue to follow and assess for appropriate referrals and possible discharge planning.   Reason for Continuation of Hospitalization: Depression Hallucinations Medication stabilization Suicidal ideation  Estimated Length of Stay:  5 - 7 days  Last 3 [place] Suicide Severity Risk Score: Flowsheet Row [facility] (Current[facility] from 02/09/2023 in [facility] Department at [facility] ED from 02/03/2023 in [facility] at [facility]  C-SSRS RISK CATEGORY Moderate Risk High Risk Low Risk       Last PHQ 2/9 Scores:    01/10/2023    2:37 PM  Depression screen PHQ 2/9  Decreased Interest 2  Down, Depressed, Hopeless 2  PHQ - 2 Score 4  Altered sleeping 2  Tired, decreased energy 1  Change in appetite 2  Feeling bad or failure about yourself  2  Trouble concentrating 1  Moving slowly or fidgety/restless 0  Suicidal thoughts 2  PHQ-9 Score 14  Difficult doing work/chores Somewhat difficult    Scribe for Treatment Team: Akim Watkinson O Bairon Klemann, LCSWA 02/12/2023 2:02 PM
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
## 2023-02-12 NOTE — Plan of Care (Signed)
   Problem: Activity: Goal: Interest or engagement in activities will improve Outcome: Progressing   Problem: Coping: Goal: Ability to verbalize frustrations and anger appropriately will improve Outcome: Progressing   Problem: Safety: Goal: Periods of time without injury will increase Outcome: Progressing

## 2023-02-13 DIAGNOSIS — F209 Schizophrenia, unspecified: Secondary | ICD-10-CM | POA: Diagnosis not present

## 2023-02-13 NOTE — Group Note (Signed)
LCSW Group Therapy Note   Group Date: 02/13/2023 Start Time: 1100 End Time: 1200  Type of Therapy and Topic:  Group Therapy - Who Am I?  Participation Level:  PT DID NOT ATTEND  Description of Group The focus of this group was to aid patients in self-exploration and awareness. Patients were guided in exploring various factors of oneself to include interests, readiness to change, management of emotions, and individual perception of self. Patients were provided with complementary worksheets exploring hidden talents, ease of asking other for help, music/media preferences, understanding and responding to feelings/emotions, and hope for the future. At group closing, patients were encouraged to adhere to discharge plan to assist in continued self-exploration and understanding.   Therapeutic Modalities Cognitive Behavioral Therapy Motivational Interviewing  Kathrynn Humble 02/13/2023  6:23 PM

## 2023-02-13 NOTE — [facility] Group Notes (Signed)
[facility] Group Notes:  (Nursing/MHT/Case Management/Adjunct)  Date:  02/13/2023  Time:  9:17 PM  Type of Therapy:   Wrap-up group  Participation Level:  Active  Participation Quality:  Appropriate  Affect:  Appropriate  Cognitive:  Appropriate  Insight:  Appropriate  Engagement in Group:  Engaged  Modes of Intervention:  Education  Summary of Progress/Problems: Goal to get along with others. Rated day 7/10.   Ethan Sutton 02/13/2023, 9:17 PM
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
## 2023-02-13 NOTE — Progress Notes (Signed)
   02/13/23 2135  Psych Admission Type (Psych Patients Only)  Admission Status Voluntary  Psychosocial Assessment  Patient Complaints Anxiety  Eye Contact Fair  Facial Expression Flat  Affect Anxious  Speech Logical/coherent  Interaction Assertive  Motor Activity Fidgety  Appearance/Hygiene In scrubs  Behavior Characteristics Cooperative  Mood Anxious  Thought Process  Coherency WDL  Content WDL  Delusions None reported or observed  Perception WDL  Hallucination None reported or observed  Judgment Impaired  Confusion None  Danger to Self  Current suicidal ideation? Denies  Agreement Not to Harm Self Yes  Description of Agreement verbal  Danger to Others  Danger to Others None reported or observed

## 2023-02-13 NOTE — Group Note (Signed)
Date:  02/13/2023 Time:  5:16 PM  Group Topic/Focus:  Nutrition Group    Participation Level:  Did Not Attend  Participation Quality:   n/a  Affect:   n/a  Cognitive:   n/a  Insight: None  Engagement in Group:   n/a  Modes of Intervention:   n/a  Additional Comments:   Pt did not attend.  Ethan Sutton 02/13/2023, 5:16 PM

## 2023-02-13 NOTE — Plan of Care (Signed)
  Problem: Medication: Goal: Compliance with prescribed medication regimen will improve Outcome: Progressing   

## 2023-02-13 NOTE — Plan of Care (Signed)
  Problem: Education: Goal: Emotional status will improve Outcome: Progressing Goal: Mental status will improve Outcome: Progressing   

## 2023-02-13 NOTE — Group Note (Signed)
Date:  02/13/2023 Time:  4:56 PM  Group Topic/Focus:  Dimensions of Wellness:   The focus of this group is to introduce the topic of wellness and discuss the role each dimension of wellness plays in total health. Emotional Education:   The focus of this group is to discuss what feelings/emotions are, and how they are experienced.    Participation Level:  Did Not Attend  Participation Quality:   n/a  Affect:   n/a  Cognitive:   n/a  Insight: None  Engagement in Group:   n/a  Modes of Intervention:   n/a  Additional Comments:   Pt did not attend.  Ethan Sutton 02/13/2023, 4:56 PM

## 2023-02-13 NOTE — Plan of Care (Signed)
  Problem: Activity: Goal: Interest or engagement in activities will improve Outcome: Progressing   Problem: Safety: Goal: Periods of time without injury will increase Outcome: Progressing   

## 2023-02-13 NOTE — Progress Notes (Signed)
   02/13/23 0559  15 Minute Checks  Location Bedroom  Visual Appearance Calm  Behavior Sleeping  Sleep (Behavioral Health Patients Only)  Calculate sleep? (Click Yes once per 24 hr at 0600 safety check) Yes  Documented sleep last 24 hours 4.25

## 2023-02-13 NOTE — Progress Notes (Signed)
   02/12/23 2209  Psych Admission Type (Psych Patients Only)  Admission Status Voluntary  Psychosocial Assessment  Patient Complaints Anxiety;Depression  Eye Contact Fair  Facial Expression Animated  Affect Anxious  Speech Logical/coherent  Interaction Assertive  Motor Activity Fidgety  Appearance/Hygiene In scrubs  Behavior Characteristics Cooperative  Mood Depressed;Anxious  Thought Process  Coherency WDL  Content WDL  Delusions None reported or observed  Perception WDL  Hallucination None reported or observed  Judgment Impaired  Confusion None  Danger to Self  Current suicidal ideation? Denies  Agreement Not to Harm Self Yes  Description of Agreement verbal  Danger to Others  Danger to Others None reported or observed

## 2023-02-13 NOTE — Progress Notes (Addendum)
D. Pt presented mildly irritable upon initial approach this am, but since has been appropriate. Pt reported sleeping well last night, described his appetite and concentration as 'good', and energy level as 'normal'. Per pt's self inventory, pt rated his depression,hopelessness and anxiety a 3/6/7, respectively. Pt's stated goal today is "to get along with everybody." Pt has been visible in the milieu at times, but was not observed attending groups. Pt currently denies SI/HI and AVH and does not appear to be responding to internal stimuli.  A. Labs and vitals monitored.  Pt supported emotionally and encouraged to express concerns and ask questions.   R. Pt remains safe with 15 minute checks. Will continue POC.    02/13/23 1000  Psych Admission Type (Psych Patients Only)  Admission Status Voluntary  Psychosocial Assessment  Patient Complaints Anxiety  Eye Contact Fair  Facial Expression Anxious  Affect Anxious  Speech Logical/coherent  Interaction Assertive  Motor Activity Fidgety  Appearance/Hygiene In scrubs  Behavior Characteristics Cooperative  Mood Anxious  Thought Process  Coherency WDL  Content WDL  Delusions None reported or observed  Perception WDL  Hallucination None reported or observed  Judgment Impaired  Confusion None  Danger to Self  Current suicidal ideation? Denies  Agreement Not to Harm Self Yes  Description of Agreement agreed to contact staff before acting on harmful thoughts  Danger to Others  Danger to Others None reported or observed

## 2023-02-13 NOTE — Group Note (Signed)
Date:  02/13/2023 Time:  2:06 PM  Group Topic/Focus:  Goals Group:   The focus of this group is to help patients establish daily goals to achieve during treatment and discuss how the patient can incorporate goal setting into their daily lives to aide in recovery. Orientation:   The focus of this group is to educate the patient on the purpose and policies of crisis stabilization and provide a format to answer questions about their admission.  The group details unit policies and expectations of patients while admitted.    Participation Level:  Did Not Attend  Participation Quality:   n/a  Affect:   n/a  Cognitive:   n/a  Insight: None  Engagement in Group:   n/a  Modes of Intervention:   n/a  Additional Comments:   Pt did not attend.  Edmund Hilda Maeghan Canny 02/13/2023, 2:06 PM

## 2023-02-14 DIAGNOSIS — F209 Schizophrenia, unspecified: Secondary | ICD-10-CM | POA: Diagnosis not present

## 2023-02-14 NOTE — Plan of Care (Signed)
°  Problem: Education: °Goal: Emotional status will improve °Outcome: Progressing °Goal: Mental status will improve °Outcome: Progressing °  °Problem: Activity: °Goal: Interest or engagement in activities will improve °Outcome: Progressing °  °

## 2023-02-14 NOTE — Progress Notes (Signed)
   02/14/23 0558  15 Minute Checks  Location Bedroom  Visual Appearance Calm  Behavior Sleeping  Sleep (Behavioral Health Patients Only)  Calculate sleep? (Click Yes once per 24 hr at 0600 safety check) Yes  Documented sleep last 24 hours 7.5

## 2023-02-14 NOTE — Progress Notes (Signed)
   02/14/23 1500  Psych Admission Type (Psych Patients Only)  Admission Status Voluntary  Psychosocial Assessment  Patient Complaints Anxiety  Eye Contact Fair  Facial Expression Flat  Affect Anxious  Speech Logical/coherent  Interaction Assertive  Motor Activity Fidgety  Appearance/Hygiene In scrubs  Behavior Characteristics Cooperative  Mood Anxious  Thought Process  Coherency WDL  Content WDL  Delusions None reported or observed  Perception WDL  Hallucination None reported or observed  Judgment Impaired  Confusion None  Danger to Self  Current suicidal ideation? Denies  Agreement Not to Harm Self Yes  Description of Agreement verbal  Danger to Others  Danger to Others None reported or observed

## 2023-02-14 NOTE — Group Note (Signed)
Date:  02/14/2023 Time:  1:54 PM  Group Topic/Focus:  Goals Group:   The focus of this group is to help patients establish daily goals to achieve during treatment and discuss how the patient can incorporate goal setting into their daily lives to aide in recovery. Orientation:   The focus of this group is to educate the patient on the purpose and policies of crisis stabilization and provide a format to answer questions about their admission.  The group details unit policies and expectations of patients while admitted.    Participation Level:  Did Not Attend  Participation Quality:   n/a  Affect:   n/a  Cognitive:   n/a  Insight: None  Engagement in Group:   n/a  Modes of Intervention:   n/a  Additional Comments:   Pt did not attend.  Ethan Sutton 02/14/2023, 1:54 PM
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
## 2023-02-14 NOTE — Group Note (Signed)
Recreation Therapy Group Note   Group Topic:Leisure Education  Group Date: 02/14/2023 Start Time: 0933 End Time: 1009 Facilitators: Maciah Feeback-McCall, LRT,CTRS Location: [address] Dayroom   Group Topic: Leisure Education  Goal Area(s) Addresses:  Patient will identify positive leisure activities.  Patient will identify one positive benefit of participation in leisure activities.   Intervention: Leisure Group Game  Activity: Patient, MHT, and LRT participated in playing a trivia game of [place] [place]. LRT and patients discussed what leisure is, what examples of leisure activities are, where you can participate in leisure and why leisure is important.   Education:  Leisure Exposure, Pharmacist, community, Discharge Planning  Education Outcome: Acknowledges education/In group clarification offered/Needs additional education   Affect/Mood: N/A   Participation Level: Did not attend    Clinical Observations/Individualized Feedback:     Plan: Continue to engage patient in RT group sessions 2-3x/week.   Ethan Sutton, LRT,CTRS 02/14/2023 12:06 PM

## 2023-02-14 NOTE — [facility] Suicide Risk Assessment (Signed)
Suicide Risk Assessment  Discharge Assessment    [facility] Discharge Suicide Risk Assessment   Principal Problem: Schizophrenia [facility]) Discharge Diagnoses: Principal Problem:   Schizophrenia (HCC)  Ethan Sutton is a 38 y.o. male  with a past psychiatric history of schizophrenia. Patient initially arrived to [facility] on 02/09/23 for suicidal thoughts with plan to OD on fentanyl, and admitted to [facility] voluntarily on 02/10/23 for acute safety concerns, acute suicidal or self-harming behaviors, and stabilization of acute on chronic psychiatric conditions. PMHx is significant for chronic pain.   Hospital course: Patient presented to [facility] for concern for psychosis.  Patient was incarcerated and was released in November and has had multiple hospitalizations since for schizophrenia and has been initiated on olanzapine 15 mg.  Per mother patient has had medication nonadherence and has been experiencing symptoms of psychosis including auditory hallucinations of (responding to himself), paranoia (suspicious with mother), and disorganization.  While in the hospital patient was not observed to be experiencing any of the symptoms but these symptoms may have resolved prior to transfer to [facility] given was an emergency department.  Patient did not have any side effects on Zyprexa and finds it helpful for sleep.  There was some discussion with mother if patient would be able to come home given that he has not been held from the house and he has been in conflict with her.  On discharge she is agreeable to him coming home but patient was also provided resources for transitional living facilities in the community so that he can get a job, housing, and be in the community with others to abstaining from substances and are taking care of the mental health.  Discussed the patient may be a candidate for ACT team in future given numerous hospitalizations and medication nonadherence and risk for homelessness if lose housing with mother due to  medication adherence.   During the patient's hospitalization, patient had extensive initial psychiatric evaluation, and follow-up psychiatric evaluations every day.   Psychiatric diagnoses provided upon initial assessment: Schizophrenia   Patient's psychiatric medications were adjusted on admission:  -- Continue olanzapine 50 mg once nightly for schizophrenia             -- Continue trazodone 50 mg once nightly as needed for insomnia             -- Continue hydroxyzine 25 mg 3 times daily as needed for anxiety   During the hospitalization, other adjustments were made to the patient's psychiatric medication regimen: none   Patient's care was discussed during the interdisciplinary team meeting every day during the hospitalization.   The patient is not having side effects to prescribed psychiatric medication.   Gradually, patient started adjusting to milieu. The patient was evaluated each day by a clinical provider to ascertain response to treatment. Improvement was noted by the patient's report of decreasing symptoms, improved sleep and appetite, affect, medication tolerance, behavior, and participation in unit programming.  Patient was asked each day to complete a self inventory noting mood, mental status, pain, new symptoms, anxiety and concerns.   Symptoms were reported as significantly decreased or resolved completely by discharge.  The patient reports that their mood is stable.  The patient denied having suicidal thoughts for more than 48 hours prior to discharge.  Patient denies having homicidal thoughts.  Patient denies having auditory hallucinations.  Patient denies any visual hallucinations or other symptoms of psychosis.  The patient was motivated to continue taking medication with a goal of continued improvement in  mental health.    The patient reports their target psychiatric symptoms of auditory hallucinations, paranoia, and impulsivity responded well to the psychiatric medications,  and the patient reports overall benefit other psychiatric hospitalization. Supportive psychotherapy was provided to the patient. The patient also participated in regular group therapy while hospitalized. Coping skills, problem solving as well as relaxation therapies were also part of the unit programming.   Labs were reviewed with the patient, and abnormal results were discussed with the patient.   The patient is able to verbalize their individual safety plan to this provider.   # It is recommended to the patient to continue psychiatric medications as prescribed, after discharge from the hospital.     # It is recommended to the patient to follow up with your outpatient psychiatric provider and PCP.   # It was discussed with the patient, the impact of alcohol, drugs, tobacco have been there overall psychiatric and medical wellbeing, and total abstinence from substance use was recommended the patient.ed.   # Prescriptions provided or sent directly to preferred pharmacy at discharge. Patient agreeable to plan. Given opportunity to ask questions. Appears to feel comfortable with discharge.    # In the event of worsening symptoms, the patient is instructed to call the crisis hotline, 911 and or go to the nearest ED for appropriate evaluation and treatment of symptoms. To follow-up with primary care provider for other medical issues, concerns and or health care needs   # Patient was discharged home with a plan to follow up as noted below.     On day of discharge reports that he is doing well. Reports that his mood is good. Reports that he slept good last night. Reports that his apetite is good. Reports that he looks forward to leaving the hospital and reaching out to TLFs in the next week, so he can get out of his usual environment in which he "falls back to his past self." Talked with his mom yesterday and they discussed discharge plans for his to come back home today. He denies SI, HI, AVH. He denies  side effects of medication including excessive sedation, emotional numbing, and extrapyramidal symptoms including dystonia (sudden spastic contractions of muscle groups), parkinsonism (bradykinesia, tremors, rigidity), and akathisia (severe restlessness).   Total Time spent with patient: 30 minutes  Musculoskeletal: Strength & Muscle Tone: within normal limits Gait & Station: normal Patient leans: N/A  Psychiatric Specialty Exam  Presentation  General Appearance:  Appropriate for Environment; Casual; Fairly Groomed  Eye Contact: Good  Speech: Normal Rate; Clear and Coherent  Speech Volume: Normal  Handedness: Right   Mood and Affect  Mood: Euthymic  Duration of Depression Symptoms: Greater than two weeks  Affect: Appropriate; Congruent; Full Range   Thought Process  Thought Processes: Linear  Descriptions of Associations:Intact  Orientation:Full (Time, Place and Person)  Thought Content:Logical  History of Schizophrenia/Schizoaffective disorder:Yes  Duration of Psychotic Symptoms:Greater than six months  Hallucinations:Hallucinations: None  Ideas of Reference:None  Suicidal Thoughts:Suicidal Thoughts: No  Homicidal Thoughts:Homicidal Thoughts: No   Sensorium  Memory: Immediate Good; Recent Good; Remote Good  Judgment: Good  Insight: Good   Executive Functions  Concentration: Good  Attention Span: Good  Recall: Good  Fund of Knowledge: Good  Language: Good   Psychomotor Activity  Psychomotor Activity: Psychomotor Activity: Normal   Assets  Assets: Desire for Improvement; Housing; Social Support   Sleep  Sleep: Sleep: Fair   Physical Exam: Physical Exam Vitals and nursing note reviewed.  HENT:     Head: Normocephalic and atraumatic.  Pulmonary:     Effort: Pulmonary effort is normal.  Neurological:     General: No focal deficit present.     Mental Status: He is alert.  Psychiatric:     Comments: No  obvious EPS.    Review of Systems  Constitutional:  Negative for fever.  Cardiovascular:  Negative for chest pain and palpitations.  Gastrointestinal:  Negative for constipation, diarrhea, nausea and vomiting.  Musculoskeletal:  Positive for joint pain.  Neurological:  Negative for dizziness, weakness and headaches.  Psychiatric/Behavioral:         Pt denies extrapyramidal symptoms including dystonia (sudden spastic contractions of muscle groups), parkinsonism (bradykinesia, tremors, rigidity), and akathisia (severe restlessness).   Blood pressure 114/74, pulse 82, temperature 98.2 F (36.8 C), temperature source Oral, resp. rate 18, height 5\' 11"  (1.803 m), weight 74.8 kg, SpO2 100%. Body mass index is 23.01 kg/m.  Mental Status Per Nursing Assessment::   On Admission:  Suicidal ideation indicated by patient  Demographic Factors:  Male, Low socioeconomic status, and Unemployed  Loss Factors: Legal issues  Historical Factors: NA  Risk Reduction Factors:   Living with another person, especially a relative and Positive social support  Continued Clinical Symptoms:  Mood is stable. Anxiety at a manageable level. Denying any SI including passive SI. No symptoms of psychosis.   Cognitive Features That Contribute To Risk:  None    Suicide Risk:  Mild:  There are no identifiable suicide plans, no associated intent, mild dysphoria and related symptoms, good self-control (both objective and subjective assessment), few other risk factors, and identifiable protective factors, including available and accessible social support.   Follow-up Information     [name]. Go on 02/26/2023.   Why: You have a hospital follow up appointment on 02/26/23 at 10:00 am .  The appointment will be held in person.  Following this appointment, you will be scheduled for a clinical assessment, to obtain necessary therapy and medication management services. Contact information: [address] [place] [address] [phone]                 Plan Of Care/Follow-up recommendations:  Follow-up recommendations:   Activity: as tolerated   Diet: heart healthy   Other: -Follow-up with your outpatient psychiatric provider -instructions on appointment date, time, and address (location) are provided to you in discharge paperwork.   -Take your psychiatric medications as prescribed at discharge - instructions are provided to you in the discharge paperwork   -Follow-up with outpatient primary care doctor and other specialists -for management of chronic medical disease, including: None   -Testing: Follow-up with outpatient provider for abnormal lab results: Get lipid panel and A1c, patient was refusing while inpatient.   -Recommend abstinence from alcohol, tobacco, and other illicit drug use at discharge.    -If your psychiatric symptoms recur, worsen, or if you have side effects to your psychiatric medications, call your outpatient psychiatric provider, 911, 988 or go to the nearest emergency department.   -If suicidal thoughts recur, call your outpatient psychiatric provider, 911, 988 or go to the nearest emergency department.  Meryl Dare, MD PGY-1 Psychiatry Resident 02/16/2023, 8:51 AM

## 2023-02-14 NOTE — Discharge Instructions (Signed)
-  Follow-up with your outpatient psychiatric provider (and therapist) -instructions on appointment date, time, and address (location) are provided to you in discharge paperwork.  -Take your psychiatric medications as prescribed at discharge - instructions are provided to you in the discharge paperwork  -Follow-up with outpatient primary care doctor and other specialists -for management of preventative medicine and any chronic medical disease.  -Recommend abstinence from alcohol, tobacco, cannabis, and other substances at discharge.   -If your psychiatric symptoms recur, worsen, or if you have severe side effects to your psychiatric medications, call your outpatient psychiatric provider, 911, 988 (national suicide hotline), go to [facility], or go to the nearest emergency department.  -If suicidal thoughts occur, call your outpatient psychiatric provider, 911, 988 (national suicide hotline), go to [facility], or go to the nearest emergency department.  Naloxone (Narcan) can help reverse an overdose when given to the victim quickly.  [facility] offers free naloxone kits and instructions/training on its use.  Add naloxone to your first aid kit and you can help save a life.   Pick up your free kit at the following locations:   [place]:  [facility], [address] [place] [address] [phone]) [facility] [address] [place] [address] [phone]) [facility] [address] [place] [address]  [place]: [facility] [address] [place] [address] ([phone]) [facility] [address] [place] [address] [phone])

## 2023-02-14 NOTE — Group Note (Signed)
Date:  02/14/2023 Time:  4:45 PM  Group Topic/Focus:  Dimensions of Wellness:   The focus of this group is to introduce the topic of wellness and discuss the role each dimension of wellness plays in total health.    Participation Level:  Did Not Attend  Participation Quality:   n/a  Affect:   n/a  Cognitive:   n/a  Insight: None  Engagement in Group:   n/a  Modes of Intervention:   n/a  Additional Comments:   Pt did not attend.  [name] 02/14/2023, 4:45 PM

## 2023-02-14 NOTE — Progress Notes (Signed)
Pt refused AM labs

## 2023-02-14 NOTE — Discharge Summary (Signed)
Physician Discharge Summary Note  Patient:  Ethan Sutton is an 38 y.o., male MRN:  [id number] DOB:  [birth year] Patient phone:  [phone] (home)  Patient address:   [address] [facility] [address],  Total Time spent with patient: 1 hour  Date of Admission:  02/10/2023 Date of Discharge: 02/14/23  Ethan Sutton is a 38 y.o. male  with a past psychiatric history of schizophrenia. Patient initially arrived to [facility] on 02/09/23 for suicidal thoughts with plan to OD on fentanyl, and admitted to [facility] voluntarily on 02/10/23 for acute safety concerns, acute suicidal or self-harming behaviors, and stabilization of acute on chronic psychiatric conditions. PMHx is significant for chronic pain.    Collateral Information, Mother, [phone]: she denies that he has been taking medication. Has not followd up with . He has been acting inappropriately over the last few days including "memory doesn't work," does listen, does whatever he wants to do. He smokes tobacco and leaves his tobacco everywhere and when asked to move his tobacco. Reports that he is responding to voices. Reports taht he is paranoid and blames others for what he is experiencing. He gets aggressive. Reports that he has been staying in her house. She reports that he cannot come back to the house and that he needs to stay in a shelter to learn. When on medication, she does not notice a difference. Reports that younger son is diagnosed with schizophrenia and has aggression and lives in group home, and the older brother has severe anxiety.    HPI:  Patient is inconsistent historian and provides different answers throughout interview that contradict previous information.   Patient reports that he is here because his medication (olanzapine) is not been working.  When asked him to clarify he is unable to find a reason for the statement or more clarifying responses.  Discussed if they have been treating the symptoms that have been causing issues, and  he is unable to identify symptoms other than pain.  When prompted on issues he may be having including hallucinations, depression, sleep, etc. he endorses sleep but is unable to endorse other reasons for medications being helpful or not helpful.  When asked if he is having side effects, he reports that he is tolerating the medicine okay.  Discussed if he is having any excessive sedation and he reports no because "he takes it at night."  Reports that the olanzapine helps with his sleep at night.  He does not report experiencing any auditory hallucinations, visualizations, paranoia, or other issues related to schizophrenia.     On review of systems, patient denies experiencing any depressive symptoms.  However on PHQ patient endorses having anhedonia, depressed mood, little energy, worthlessness, lack concentration, and psychomotor retardation.  He currently is denying suicidal thoughts.  When asked if he was experiencing suicidal thoughts prior to admission, he denies suicidal thoughts but does endorse wanting to overdose on fentanyl.  However, he denies using fentanyl anytime recently but does report remote history of use and that he still has access to it.  He does report current use of alcohol but on further questioning reports that he barely uses it drinking 1 drink a day every few days.  Again the interview he reports that he uses alcohol to fall asleep and that he can drink it "whenever."  He denies no other substance use.  Patient endorses being anxious.  He denies experiencing hallucinations or paranoia.  He denies any past trauma.  He has permission for [name] to reach  out to his mother.   Principal Problem: Schizophrenia [facility]) Discharge Diagnoses: Principal Problem:   Schizophrenia [facility])   Past Psychiatric History:  Current Psychiatrist: Has not got established at [facility] Current Therapist: Denies Previous Psychiatric Diagnoses: Schizophrenia Psychiatric Medications: Current Olanzapine 15 mg once  nightly Past Risperidone 1 mg twice daily Psychiatric Hospitalization hx: 2 hospitalizations in November 2024 and December 2024 for similar presentation Psychotherapy hx: Denies Neuromodulation history: Denies History of suicide: Denies History of homicide or aggression: Denies  Past Medical History:  Past Medical History:  Diagnosis Date   Depression 02/09/2023   Schizophrenia [facility])     Past Surgical History:  Procedure Laterality Date   NO PAST SURGERIES     Family History: History reviewed. No pertinent family history. Family Psychiatric  History: Patient denies, per mother has a sibling with schizophrenia and group home and another brother with severe anxiety, aggression in siblings Social History:  Social History   Substance and Sexual Activity  Alcohol Use Not Currently   Comment: ive been in prison for a year i haven't used any     Social History   Substance and Sexual Activity  Drug Use Not Currently    Social History   Socioeconomic History   Marital status: Single    Spouse name: Not on file   Number of children: Not on file   Years of education: Not on file   Highest education level: Not on file  Occupational History   Not on file  Tobacco Use   Smoking status: Former    Current packs/day: 0.00    Types: Cigarettes    Quit date: 2019    Years since quitting: 6.0   Smokeless tobacco: Never  Vaping Use   Vaping status: Never Used  Substance and Sexual Activity   Alcohol use: Not Currently    Comment: ive been in prison for a year i haven't used any   Drug use: Not Currently   Sexual activity: Not Currently    Birth control/protection: Condom, Abstinence  Other Topics Concern   Not on file  Social History Narrative   Not on file   Social Drivers of Health   Financial Resource Strain: Not on file  Food Insecurity: No Food Insecurity (02/10/2023)   Hunger Vital Sign    Worried About Running Out of Food in the Last Year: Never true    Ran Out of  Food in the Last Year: Never true  Transportation Needs: No Transportation Needs (02/10/2023)   PRAPARE - Administrator, Civil Service (Medical): No    Lack of Transportation (Non-Medical): No  Physical Activity: Not on file  Stress: Not on file  Social Connections: Not on file    Hospital course: Patient presented to [facility] for concern for psychosis.  Patient was incarcerated and was released in November and has had multiple hospitalizations since for schizophrenia and has been initiated on olanzapine 15 mg.  Per mother patient has had medication nonadherence and has been experiencing symptoms of psychosis including auditory hallucinations of (responding to himself), paranoia (suspicious with mother), and disorganization.  While in the hospital patient was not observed to be experiencing any of the symptoms but these symptoms may have resolved prior to transfer to [facility] given was an emergency department.  Patient did not have any side effects on Zyprexa and finds it helpful for sleep.  There was some discussion with mother if patient would be able to come home given that he has not  been held from the house and he has been in conflict with her.  On discharge she is agreeable to him coming home but patient was also provided resources for transitional living facilities in the community so that he can get a job, housing, and be in the community with others to abstaining from substances and are taking care of the mental health.  Discussed the patient may be a candidate for ACT team in future given numerous hospitalizations and medication nonadherence and risk for homelessness if lose housing with mother due to medication adherence.  During the patient's hospitalization, patient had extensive initial psychiatric evaluation, and follow-up psychiatric evaluations every day.  Psychiatric diagnoses provided upon initial assessment: Schizophrenia  Patient's psychiatric medications were adjusted on  admission:  -- Continue olanzapine 50 mg once nightly for schizophrenia             -- Continue trazodone 50 mg once nightly as needed for insomnia             -- Continue hydroxyzine 25 mg 3 times daily as needed for anxiety  During the hospitalization, other adjustments were made to the patient's psychiatric medication regimen: none  Patient's care was discussed during the interdisciplinary team meeting every day during the hospitalization.  The patient is not having side effects to prescribed psychiatric medication.  Gradually, patient started adjusting to milieu. The patient was evaluated each day by a clinical provider to ascertain response to treatment. Improvement was noted by the patient's report of decreasing symptoms, improved sleep and appetite, affect, medication tolerance, behavior, and participation in unit programming.  Patient was asked each day to complete a self inventory noting mood, mental status, pain, new symptoms, anxiety and concerns.   Symptoms were reported as significantly decreased or resolved completely by discharge.  The patient reports that their mood is stable.  The patient denied having suicidal thoughts for more than 48 hours prior to discharge.  Patient denies having homicidal thoughts.  Patient denies having auditory hallucinations.  Patient denies any visual hallucinations or other symptoms of psychosis.  The patient was motivated to continue taking medication with a goal of continued improvement in mental health.   The patient reports their target psychiatric symptoms of auditory hallucinations, paranoia, and impulsivity responded well to the psychiatric medications, and the patient reports overall benefit other psychiatric hospitalization. Supportive psychotherapy was provided to the patient. The patient also participated in regular group therapy while hospitalized. Coping skills, problem solving as well as relaxation therapies were also part of the unit  programming.  Labs were reviewed with the patient, and abnormal results were discussed with the patient.  The patient is able to verbalize their individual safety plan to this provider.  # It is recommended to the patient to continue psychiatric medications as prescribed, after discharge from the hospital.    # It is recommended to the patient to follow up with your outpatient psychiatric provider and PCP.  # It was discussed with the patient, the impact of alcohol, drugs, tobacco have been there overall psychiatric and medical wellbeing, and total abstinence from substance use was recommended the patient.ed.  # Prescriptions provided or sent directly to preferred pharmacy at discharge. Patient agreeable to plan. Given opportunity to ask questions. Appears to feel comfortable with discharge.    # In the event of worsening symptoms, the patient is instructed to call the crisis hotline, 911 and or go to the nearest ED for appropriate evaluation and treatment of symptoms. To follow-up with  primary care provider for other medical issues, concerns and or health care needs  # Patient was discharged home with a plan to follow up as noted below.   On day of discharge reports that he is doing well. Reports that his mood is good. Reports that he slept good last night. Reports that his apetite is good. Reports that he looks forward to leaving the hospital and reaching out to TLFs in the next week, so he can get out of his usual environment in which he "falls back to his past self." Talked with his mom yesterday and they discussed discharge plans for his to come back home today. He denies SI, HI, AVH. He denies side effects of medication including excessive sedation, emotional numbing, and extrapyramidal symptoms including dystonia (sudden spastic contractions of muscle groups), parkinsonism (bradykinesia, tremors, rigidity), and akathisia (severe restlessness).    Physical Findings: AIMS: Facial and  Oral Movements Muscles of Facial Expression: None Lips and Perioral Area: None Jaw: None Tongue: None,Extremity Movements Upper (arms, wrists, hands, fingers): None Lower (legs, knees, ankles, toes): None, Trunk Movements Neck, shoulders, hips: None, Global Judgements Severity of abnormal movements overall : None Incapacitation due to abnormal movements: None Patient's awareness of abnormal movements: No Awareness, Dental Status Current problems with teeth and/or dentures?: No Does patient usually wear dentures?: No Edentia?: No    Musculoskeletal: Strength & Muscle Tone: within normal limits Gait & Station: normal Patient leans: N/A   Psychiatric Specialty Exam:  Presentation  General Appearance:  Appropriate for Environment; Casual; Fairly Groomed  Eye Contact: Good  Speech: Normal Rate; Clear and Coherent  Speech Volume: Normal  Handedness: Right   Mood and Affect  Mood: Euthymic  Affect: Appropriate; Congruent; Full Range   Thought Process  Thought Processes: Linear  Descriptions of Associations:Intact  Orientation:Full (Time, Place and Person)  Thought Content:Logical  History of Schizophrenia/Schizoaffective disorder:Yes  Duration of Psychotic Symptoms:Greater than six months  Hallucinations:Hallucinations: None  Ideas of Reference:None  Suicidal Thoughts:Suicidal Thoughts: No  Homicidal Thoughts:Homicidal Thoughts: No   Sensorium  Memory: Immediate Good; Recent Good; Remote Good  Judgment: Good  Insight: Good   Executive Functions  Concentration: Good  Attention Span: Good  Recall: Good  Fund of Knowledge: Good  Language: Good   Psychomotor Activity  Psychomotor Activity: Psychomotor Activity: Normal   Assets  Assets: Desire for Improvement; Housing; Social Support   Sleep  Sleep: Sleep: Fair    Physical Exam: Physical Exam Vitals and nursing note reviewed.  HENT:     Head: Normocephalic and  atraumatic.  Pulmonary:     Effort: Pulmonary effort is normal.  Neurological:     General: No focal deficit present.     Mental Status: He is alert.  Psychiatric:     Comments: No obvious EPS.    Review of Systems  Constitutional:  Negative for fever.  Cardiovascular:  Negative for chest pain and palpitations.  Gastrointestinal:  Negative for constipation, diarrhea, nausea and vomiting.  Musculoskeletal:  Positive for joint pain.  Neurological:  Negative for dizziness, weakness and headaches.  Psychiatric/Behavioral:         Pt denies extrapyramidal symptoms including dystonia (sudden spastic contractions of muscle groups), parkinsonism (bradykinesia, tremors, rigidity), and akathisia (severe restlessness).   Blood pressure 114/74, pulse 82, temperature 98.2 F (36.8 C), temperature source Oral, resp. rate 18, height 5\' 11"  (1.803 m), weight 74.8 kg, SpO2 100%. Body mass index is 23.01 kg/m.   Social History   Tobacco Use  Smoking  Status Former   Current packs/day: 0.00   Types: Cigarettes   Quit date: 2019   Years since quitting: 6.0  Smokeless Tobacco Never   Tobacco Cessation:  N/A, patient does not currently use tobacco products   Blood Alcohol level:  Lab Results  Component Value Date   ETH <10 02/09/2023   ETH <10 01/21/2023    Metabolic Disorder Labs:  No results found for: "HGBA1C", "MPG" No results found for: "PROLACTIN" No results found for: "CHOL", "TRIG", "HDL", "CHOLHDL", "VLDL", "LDLCALC"  See Psychiatric Specialty Exam and Suicide Risk Assessment completed by Attending Physician prior to discharge.  Discharge destination:  Home  Is patient on multiple antipsychotic therapies at discharge:  No   Has Patient had three or more failed trials of antipsychotic monotherapy by history:  No  Recommended Plan for Multiple Antipsychotic Therapies: NA  Discharge Instructions     Diet - low sodium heart healthy   Complete by: As directed    Increase  activity slowly   Complete by: As directed       Allergies as of 02/16/2023   No Known Allergies      Medication List     TAKE these medications      Indication  ibuprofen 200 MG tablet Commonly known as: ADVIL Take 200 mg by mouth as needed for headache or mild pain (pain score 1-3).  Indication: Pain   melatonin 5 MG Tabs Take 1 tablet (5 mg total) by mouth at bedtime.  Indication: Trouble Sleeping   OLANZapine 15 MG tablet Commonly known as: ZYPREXA Take 1 tablet (15 mg total) by mouth at bedtime.  Indication: Schizophrenia   traZODone 50 MG tablet Commonly known as: DESYREL Take 1 tablet (50 mg total) by mouth at bedtime as needed for sleep. What changed:  medication strength how much to take  Indication: Trouble Sleeping        Follow-up Information     [name]. Go on 02/26/2023.   Why: You have a hospital follow up appointment on 02/26/23 at 10:00 am .  The appointment will be held in person.  Following this appointment, you will be scheduled for a clinical assessment, to obtain necessary therapy and medication management services. Contact information: [address] [place] [address] [phone]                 Follow-up recommendations:   Activity: as tolerated  Diet: heart healthy  Other: -Follow-up with your outpatient psychiatric provider -instructions on appointment date, time, and address (location) are provided to you in discharge paperwork.  -Take your psychiatric medications as prescribed at discharge - instructions are provided to you in the discharge paperwork  -Follow-up with outpatient primary care doctor and other specialists -for management of chronic medical disease, including: None  -Testing: Follow-up with outpatient provider for abnormal lab results: Get lipid panel and A1c, patient was refusing while inpatient.  -Recommend abstinence from alcohol, tobacco, and other illicit drug use at  discharge.   -If your psychiatric symptoms recur, worsen, or if you have side effects to your psychiatric medications, call your outpatient psychiatric provider, 911, [phone] or go to the nearest emergency department.  -If suicidal thoughts recur, call your outpatient psychiatric provider, 911, [phone] or go to the nearest emergency department.   Meryl Dare, MD PGY-1 Psychiatry Resident 02/16/2023, 8:49 AM

## 2023-02-14 NOTE — Progress Notes (Signed)
   02/14/23 2106  Psych Admission Type (Psych Patients Only)  Admission Status Voluntary  Psychosocial Assessment  Patient Complaints Anxiety  Eye Contact Fair  Facial Expression Flat  Affect Anxious  Speech Logical/coherent  Interaction Assertive  Motor Activity Fidgety  Appearance/Hygiene In scrubs  Behavior Characteristics Cooperative  Mood Anxious  Thought Process  Coherency WDL  Content WDL  Delusions None reported or observed  Perception WDL  Hallucination None reported or observed  Judgment Impaired  Confusion None  Danger to Self  Current suicidal ideation? Denies  Agreement Not to Harm Self Yes  Description of Agreement verbal  Danger to Others  Danger to Others None reported or observed

## 2023-02-15 DIAGNOSIS — F209 Schizophrenia, unspecified: Secondary | ICD-10-CM | POA: Diagnosis not present

## 2023-02-15 MED ORDER — MELATONIN 5 MG PO TABS
5.0000 mg | ORAL_TABLET | Freq: Every day | ORAL | Status: DC
  Administered 2023-02-15: 5 mg via ORAL
  Filled 2023-02-15: qty 7
  Filled 2023-02-15: qty 1
  Filled 2023-02-15: qty 7
  Filled 2023-02-15: qty 1
  Filled 2023-02-15: qty 7

## 2023-02-15 NOTE — Progress Notes (Signed)
   02/15/23 0557  15 Minute Checks  Location Bedroom  Visual Appearance Calm  Behavior Sleeping  Sleep (Behavioral Health Patients Only)  Calculate sleep? (Click Yes once per 24 hr at 0600 safety check) Yes  Documented sleep last 24 hours 7

## 2023-02-15 NOTE — Progress Notes (Signed)
Patient refused normal labs.

## 2023-02-15 NOTE — Progress Notes (Signed)
   02/15/23 2141  Psych Admission Type (Psych Patients Only)  Admission Status Voluntary  Psychosocial Assessment  Patient Complaints Anxiety  Eye Contact Fair  Facial Expression Animated  Affect Appropriate to circumstance  Speech Logical/coherent  Interaction Assertive  Motor Activity Other (Comment) (WDL)  Appearance/Hygiene In scrubs  Behavior Characteristics Appropriate to situation  Mood Pleasant  Thought Process  Coherency WDL  Content WDL  Delusions None reported or observed  Perception WDL  Hallucination None reported or observed  Judgment Impaired  Confusion None  Danger to Self  Current suicidal ideation? Denies  Agreement Not to Harm Self Yes  Description of Agreement verbal  Danger to Others  Danger to Others None reported or observed
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
## 2023-02-15 NOTE — Plan of Care (Addendum)
Problem: Education: Goal: Verbalization of understanding the information provided will improve Outcome: Progressing   Problem: Activity: Goal: Interest or engagement in activities will improve Outcome: Progressing   Problem: Coping: Goal: Ability to verbalize frustrations and anger appropriately will improve Outcome: Progressing   Problem: Safety: Goal: Periods of time without injury will increase Outcome: Progressing   Pt A & O X3. Denies SI, HI, AVH and pain when assessed. Observed with fixed smile, logical, soft speech and fair eye contact on interactions. Reports he slept well with good appetite. States he looks forward to being discharge soon. Per pt "I'm here because I was suicidal, I ran out of my medication, I didn't have money to get them. I live at home right now, I want to go back to school at [employer] and study cars. I'm just waiting to leave". Pt remains medication compliant (meds at night) without adverse drug reactions. Tolerates meals and fluids well. Off unit for activities with peers and staff, returned without incident. Safety checks maintained at Q 15 minutes intervals without issues. Emotional support, encouragement and reassurance offered. Pt denies concerns at this time.

## 2023-02-15 NOTE — [facility] Group Notes (Signed)
Type of Therapy and Topic:  Group Therapy:  Boundaries  Participation Level:  Did Not Attend   Description of Group:  Patients in this group were introduced to  3 types of healthy boundaries to address the extent to which we allow others into our lives, how we communicate, and where we draw the line when it comes to our personal space, emotions, and needs.  Different types of boundaries were defined and described, and each type was discussed with understanding.  Patients discussed what additional healthy boundaries could be helpful in their recovery and wellness after discharge in order to prevent future hospitalizations.   An emphasis was placed on following up with the discharge plan when they leave the hospital in order to continue becoming healthier and happier.    Therapeutic Goals: 1)  demonstrate and identify the type of unhealthy and healthy boundaries  2)  discuss healthy boundaries and how to address it  3)  identify the patient's current level of healthy boundaries and   4)  elicit communicating healthy boundaries and trust    Summary of Patient Progress:  NA  Therapeutic Modalities:   Psychoeducation Brief Solution-Focused Therapy

## 2023-02-15 NOTE — BHH Group Notes (Signed)
Pt did not attend goals group. 

## 2023-02-15 NOTE — BHH Group Notes (Signed)
[facility] Group Notes:  (Nursing)  Date:  02/15/2023  Time: 1400  Type of Therapy:  Psychoeducational Skills  Participation Level:  Minimal  Participation Quality:  Sharing  Affect:  Appropriate  Cognitive:  Lacking  Insight:  Lacking  Engagement in Group:  Limited  Modes of Intervention:  Activity, Discussion, Exploration, Rapport Building, Socialization, and Support  Summary of Progress/Problems:  Ethan Sutton 02/15/2023, 3:02 PM

## 2023-02-16 DIAGNOSIS — F209 Schizophrenia, unspecified: Secondary | ICD-10-CM | POA: Diagnosis not present

## 2023-02-16 MED ORDER — TRAZODONE HCL 50 MG PO TABS: 50.0000 mg | ORAL_TABLET | Freq: Every evening | ORAL | 0 refills | Status: DC | PRN

## 2023-02-16 MED ORDER — MELATONIN 5 MG PO TABS: 5.0000 mg | ORAL_TABLET | Freq: Every day | ORAL | Status: DC

## 2023-02-16 MED ORDER — OLANZAPINE 15 MG PO TABS: 15.0000 mg | ORAL_TABLET | Freq: Every day | ORAL | 0 refills | Status: DC

## 2023-02-16 NOTE — BHH Suicide Risk Assessment (Signed)
[facility] INPATIENT:  Family/Significant Other Suicide Prevention Education  Suicide Prevention Education:  Education Completed; Ethan Sutton (mom) [phone],  (name of family member/significant other) has been identified by the patient as the family member/significant other with whom the patient will be residing, and identified as the person(s) who will aid the patient in the event of a mental health crisis (suicidal ideations/suicide attempt).  With written consent from the patient, the family member/significant other has been provided the following suicide prevention education, prior to the and/or following the discharge of the patient.  The suicide prevention education provided includes the following: Suicide risk factors Suicide prevention and interventions National Suicide Hotline telephone number [facility] assessment telephone number [facility] Emergency Assistance 911 [facility] and/or Residential Mobile Crisis Unit telephone number  Request made of family/significant other to: Remove weapons (e.g., guns, rifles, knives), all items previously/currently identified as safety concern.   Remove drugs/medications (over-the-counter, prescriptions, illicit drugs), all items previously/currently identified as a safety concern.  The family member/significant other verbalizes understanding of the suicide prevention education information provided.  The family member/significant other agrees to remove the items of safety concern listed above.  Ethan Sutton Ethan Sutton 02/16/2023, 9:42 AM

## 2023-02-16 NOTE — Progress Notes (Signed)
   02/16/23 0530  15 Minute Checks  Location Bedroom  Visual Appearance Calm  Behavior Sleeping  Sleep (Behavioral Health Patients Only)  Calculate sleep? (Click Yes once per 24 hr at 0600 safety check) Yes  Documented sleep last 24 hours 6.5

## 2023-02-16 NOTE — Progress Notes (Signed)
   02/16/23 0800  Psych Admission Type (Psych Patients Only)  Admission Status Voluntary  Psychosocial Assessment  Patient Complaints None  Eye Contact Fair  Facial Expression Anxious  Affect Appropriate to circumstance  Speech Logical/coherent  Interaction Assertive  Motor Activity Fidgety  Appearance/Hygiene In scrubs  Behavior Characteristics Cooperative;Appropriate to situation  Mood Pleasant  Thought Process  Coherency WDL  Content WDL  Delusions None reported or observed  Perception WDL  Hallucination None reported or observed  Judgment Impaired  Confusion None  Danger to Self  Current suicidal ideation? Denies  Danger to Others  Danger to Others None reported or observed

## 2023-02-16 NOTE — Progress Notes (Signed)
  [facility] Adult Case Management Discharge Plan :  Will you be returning to the same living situation after discharge:  Yes,  Patient will be going home At discharge, do you have transportation home?: Yes,  Patient will be taxi a home Do you have the ability to pay for your medications: Yes,  Patient will self pay  Release of information consent forms completed and in the chart;  Patient's signature needed at discharge.  Patient to Follow up at:  Follow-up Information     [name]. Go on 02/26/2023.   Why: You have a hospital follow up appointment on 02/26/23 at 10:00 am .  The appointment will be held in person.  Following this appointment, you will be scheduled for a clinical assessment, to obtain necessary therapy and medication management services. Contact information: [address] [place] [address] [phone]                 Next level of care provider has access to [facility] Link:no  Safety Planning and Suicide Prevention discussed: Yes,  CSW called and spoke to [place] (mom)     Has patient been referred to the Quitline?: Patient refused referral for treatment  Patient has been referred for addiction treatment: Patient refused referral for treatment.  Aviendha Azbell O Clotine Heiner, LCSWA 02/16/2023, 9:43 AM

## 2023-02-16 NOTE — Progress Notes (Signed)
CSW called and spoke to on call supervisor okay taxi voucher [phone] to go back home

## 2023-02-16 NOTE — Group Note (Signed)
Date:  02/16/2023 Time:  9:47 AM  Group Topic/Focus:  Goals Group:   The focus of this group is to help patients establish daily goals to achieve during treatment and discuss how the patient can incorporate goal setting into their daily lives to aide in recovery. Orientation:   The focus of this group is to educate the patient on the purpose and policies of crisis stabilization and provide a format to answer questions about their admission.  The group details unit policies and expectations of patients while admitted.    Participation Level:  Minimal  Participation Quality:  Appropriate  Affect:  Appropriate  Cognitive:  Appropriate  Insight: Appropriate  Engagement in Group:  Limited  Modes of Intervention:  Discussion  Additional Comments:    Jekhi Bolin D Trissa Molina 02/16/2023, 9:47 AM

## 2023-02-16 NOTE — Group Note (Signed)
Date:  02/16/2023 Time:  1:35 AM  Group Topic/Focus:  Wrap-Up Group:   The focus of this group is to help patients review their daily goal of treatment and discuss progress on daily workbooks.    Participation Level:  Active  Participation Quality:  Appropriate and Sharing  Affect:  Appropriate  Cognitive:  Appropriate  Insight: Appropriate  Engagement in Group:  Engaged  Modes of Intervention:  Activity and Socialization  Additional Comments:  Patients used wrap up group sheets as guide when sharing. Patient rated his day a 7/10. Patient shared his goal for today was to "get along with everyone" and patient shared that he did achieve his goal. Patient shared coping skills that he finds most helpful is "readying and watching TV". Patient shared something that he likes about himself is "himself". Patient participated in group activity after sharing.   Ethan Sutton 02/16/2023, 1:35 AM

## 2023-02-16 NOTE — Progress Notes (Signed)
Pt discharged to lobby with taxi voucher. Pt was stable and appreciative at that time. All papers, samples, and prescriptions were given and valuables returned. Suicide safety plan completed. Copy given to pt. Verbal understanding expressed. Denies SI/HI and A/VH. Pt given opportunity to express concerns and ask questions.

## 2023-02-16 NOTE — Plan of Care (Signed)
  Problem: Activity: Goal: Sleeping patterns will improve Outcome: Progressing   Problem: Coping: Goal: Ability to verbalize frustrations and anger appropriately will improve Outcome: Progressing   

## 2023-03-31 ENCOUNTER — Other Ambulatory Visit: Payer: Self-pay

## 2023-03-31 ENCOUNTER — Emergency Department
Admission: EM | Admit: 2023-03-31 | Discharge: 2023-03-31 | Disposition: A | Discharge status: Home / Self Care | Payer: Self-pay | Attending: Emergency Medicine | Admitting: Emergency Medicine

## 2023-03-31 DIAGNOSIS — Z760 Encounter for issue of repeat prescription: Secondary | ICD-10-CM | POA: Insufficient documentation

## 2023-03-31 MED ORDER — OLANZAPINE 15 MG PO TABS: 15.0000 mg | ORAL_TABLET | Freq: Every day | ORAL | 2 refills | Status: DC

## 2023-03-31 MED ORDER — TRAZODONE HCL 50 MG PO TABS: 50.0000 mg | ORAL_TABLET | Freq: Every evening | ORAL | 2 refills | Status: DC | PRN

## 2023-03-31 NOTE — ED Provider Notes (Signed)
 [facility] Provider Note    Event Date/Time   First MD Initiated Contact with Patient 03/31/23 [phone]     (approximate)   History   Medication Refill   HPI  Ethan Sutton is a 38 y.o. male with PMH of schizophrenia and depression who presents for a medication refill.  Patient needs trazodone and olanzapine.  Patient states he does not have a primary care provider and is supposed to have an appointment with [name] but missed it.  He denies any symptoms and other concerns at this time.      Physical Exam   Triage Vital Signs: ED Triage Vitals  Encounter Vitals Group     BP 03/31/23 0852 (!) 130/99     Systolic BP Percentile --      Diastolic BP Percentile --      Pulse Rate 03/31/23 0852 86     Resp 03/31/23 0852 18     Temp 03/31/23 0852 98 F (36.7 C)     Temp src --      SpO2 03/31/23 0852 99 %     Weight 03/31/23 0851 175 lb (79.4 kg)     Height 03/31/23 0851 5\' 11"  (1.803 m)     Head Circumference --      Peak Flow --      Pain Score 03/31/23 0851 0     Pain Loc --      Pain Education --      Exclude from Growth Chart --     Most recent vital signs: Vitals:   03/31/23 0852  BP: (!) 130/99  Pulse: 86  Resp: 18  Temp: 98 F (36.7 C)  SpO2: 99%   General: Awake, no distress.  CV:  Good peripheral perfusion.  Resp:  Normal effort.  Abd:  No distention.  Other:     ED Results / Procedures / Treatments   Labs (all labs ordered are listed, but only abnormal results are displayed) Labs Reviewed - No data to display   PROCEDURES:  Critical Care performed: No  Procedures   MEDICATIONS ORDERED IN ED: Medications - No data to display   IMPRESSION / MDM / ASSESSMENT AND PLAN / ED COURSE  I reviewed the triage vital signs and the nursing notes.                             38 year old male presents for a medication refill.  Blood pressure is elevated otherwise vital signs are stable and patient NAD on exam.  Differential  diagnosis includes, but is not limited to, medication refill.  Patient's presentation is most consistent with acute, uncomplicated illness.  Medication sent to patient's preferred pharmacy.  He was given information for [name] follow-up.  I will also place a referral for primary care and provide him with offices to contact.  He voiced understanding, all questions were answered and he was stable at discharge.      FINAL CLINICAL IMPRESSION(S) / ED DIAGNOSES   Final diagnoses:  Medication refill     Rx / DC Orders   ED Discharge Orders          Ordered    OLANZapine (ZYPREXA) 15 MG tablet  Daily at bedtime        03/31/23 1000    traZODone (DESYREL) 50 MG tablet  At bedtime PRN        03/31/23 1000    Ambulatory Referral to Primary  Care (Establish Care)        03/31/23 1000             Note:  This document was prepared using Dragon voice recognition software and may include unintentional dictation errors.   Cameron Ali, PA-C 03/31/23 1000    Merwyn Katos, MD 03/31/23 1537

## 2023-03-31 NOTE — Discharge Instructions (Signed)
 [name] [address] [place], [place] [address] [phone]  Please go to the following website to schedule new (and existing) patient appointments:   [email or web address]   The following is a list of primary care offices in the area who are accepting new patients at this time.  Please reach out to one of them directly and let them know you would like to schedule an appointment to follow up on an Emergency Department visit, and/or to establish a new primary care provider (PCP).  There are likely other primary care clinics in the are who are accepting new patients, but this is an excellent place to start:  [facility] Lead physician: Dr Shirlee Latch [address] [place], [place] [address] [phone]  [facility] Lead Physician: Dr Alba Cory [address], [place], [place] [address] [phone]  [facility]  Lead Physician: Dr Olevia Perches [address] [place], [place] [address] [phone]  [facility] Lead Physician: Dr Sofie Hartigan [address], [place], [place] [address] [phone]  [facility] Lead Physician: Dr Bari Edward [address] [place], [place], [place] [address] [phone]

## 2023-03-31 NOTE — ED Triage Notes (Signed)
 Pt comes with needing medication refill. Pt is out of his meds and needs refills. Pt states it is his trazodone and olanzapine. Pt states he has been out of trazodone on the 17th and the other one today.

## 2023-03-31 NOTE — ED Notes (Signed)
 See triage note  States he needs his medication refilled  Denies any other issues at present

## 2023-07-07 ENCOUNTER — Encounter: Payer: Self-pay | Admitting: Emergency Medicine

## 2023-07-07 ENCOUNTER — Other Ambulatory Visit: Payer: Self-pay

## 2023-07-07 ENCOUNTER — Emergency Department
Admission: EM | Admit: 2023-07-07 | Discharge: 2023-07-07 | Disposition: A | Discharge status: Home / Self Care | Payer: Self-pay

## 2023-07-07 DIAGNOSIS — Z760 Encounter for issue of repeat prescription: Secondary | ICD-10-CM

## 2023-07-07 MED ORDER — TRAZODONE HCL 50 MG PO TABS: 50.0000 mg | ORAL_TABLET | Freq: Every evening | ORAL | 1 refills | Status: DC | PRN

## 2023-07-07 MED ORDER — OLANZAPINE 15 MG PO TABS: 15.0000 mg | ORAL_TABLET | Freq: Every day | ORAL | 1 refills | Status: DC

## 2023-07-07 NOTE — ED Notes (Signed)
 Patient calm and cooperative with no complaints of pain, SI or HI at this time.

## 2023-07-07 NOTE — Discharge Instructions (Signed)
 Your medications were refilled with a two month supply, and I have placed a referral for you to follow up with a primary care provider for ongoing medication management. Return to the emergency department with any new or worsening symptoms.

## 2023-07-07 NOTE — ED Triage Notes (Signed)
 Patient to ED via POV for medication refill. Pt reports being out for 2 weeks. Needs refills for : zyprexa  15 mg tab and trazodone  50mg  tab. NAD noted.

## 2023-07-07 NOTE — ED Provider Notes (Signed)
 [facility] Provider Note    Event Date/Time   First MD Initiated Contact with Patient 07/07/23 [phone]     (approximate)   History   Medication Refill  Patient to ED via POV for medication refill. Pt reports being out for 2 weeks. Needs refills for : zyprexa  15 mg tab and trazodone  50mg  tab. NAD noted.   HPI Ethan Sutton is a 38 y.o. male  pmh schizophrenia presents requesting medication refill - no physical complaints - ran out of meds about 2 wks ago. Denies SI, HI, hallucinations - has not yet established with primary care   Per chart review, is on 15mg  zyprexa  daily and trazadone 50mg  at bedtime prn. Last seen in our ED 03/31/23 for medication refill. Given referral to primary care at that time as well.       Physical Exam   Triage Vital Signs: ED Triage Vitals  Encounter Vitals Group     BP 07/07/23 0727 (!) 146/83     Systolic BP Percentile --      Diastolic BP Percentile --      Pulse Rate 07/07/23 0727 90     Resp 07/07/23 0727 17     Temp 07/07/23 0728 98.6 F (37 C)     Temp Source 07/07/23 0728 Oral     SpO2 07/07/23 0727 97 %     Weight 07/07/23 0726 175 lb (79.4 kg)     Height 07/07/23 0726 5\' 11"  (1.803 m)     Head Circumference --      Peak Flow --      Pain Score 07/07/23 0726 0     Pain Loc --      Pain Education --      Exclude from Growth Chart --     Most recent vital signs: Vitals:   07/07/23 0727 07/07/23 0728  BP: (!) 146/83   Pulse: 90   Resp: 17   Temp:  98.6 F (37 C)  SpO2: 97%      General: Awake, no distress.  CV:  Good peripheral perfusion. RRR, RP 2+ Resp:  Normal effort. CTAB Psych:  Calm, cooperative, denies SI/HI/hallucinations. Able to express plan for self care.    ED Results / Procedures / Treatments   Labs (all labs ordered are listed, but only abnormal results are displayed) Labs Reviewed - No data to display   EKG  N/a   RADIOLOGY N/a    PROCEDURES:  Critical Care  performed: No  Procedures   MEDICATIONS ORDERED IN ED: Medications - No data to display   IMPRESSION / MDM / ASSESSMENT AND PLAN / ED COURSE  I reviewed the triage vital signs and the nursing notes.                              DDX/MDM/AP: Differential diagnosis includes, but is not limited to, medication refill. No e/o psychiatric decompensation. No physical complaints/concerns. VSS.   Plan: - med refill - will place repeat referral to primary care  Patient's presentation is most consistent with acute, uncomplicated illness.        FINAL CLINICAL IMPRESSION(S) / ED DIAGNOSES   Final diagnoses:  Medication refill     Rx / DC Orders   ED Discharge Orders          Ordered    OLANZapine  (ZYPREXA ) 15 MG tablet  Daily at bedtime  07/07/23 0748    traZODone  (DESYREL ) 50 MG tablet  At bedtime PRN        07/07/23 0748    Ambulatory Referral to Primary Care        07/07/23 1610             Note:  This document was prepared using Dragon voice recognition software and may include unintentional dictation errors.   [name] Deaner, MD 07/07/23 [phone]

## 2023-09-07 ENCOUNTER — Other Ambulatory Visit: Payer: Self-pay

## 2023-09-07 ENCOUNTER — Emergency Department
Admission: EM | Admit: 2023-09-07 | Discharge: 2023-09-07 | Disposition: A | Discharge status: Home / Self Care | Payer: Self-pay | Attending: Emergency Medicine | Admitting: Emergency Medicine

## 2023-09-07 DIAGNOSIS — R030 Elevated blood-pressure reading, without diagnosis of hypertension: Secondary | ICD-10-CM

## 2023-09-07 DIAGNOSIS — Z760 Encounter for issue of repeat prescription: Secondary | ICD-10-CM | POA: Insufficient documentation

## 2023-09-07 MED ORDER — TRAZODONE HCL 50 MG PO TABS: 50.0000 mg | ORAL_TABLET | Freq: Every evening | ORAL | 0 refills | Status: DC | PRN

## 2023-09-07 MED ORDER — OLANZAPINE 15 MG PO TABS: 15.0000 mg | ORAL_TABLET | Freq: Every day | ORAL | 0 refills | Status: DC

## 2023-09-07 NOTE — Discharge Instructions (Addendum)
 You were seen today for medication refill.  Please pick up and take the medications as prescribed.  Please return to the emergency department if you experience any thoughts of hurting yourself, hurting others, hallucinations, any other new, worsening or concerning symptoms.    You also had an elevated blood pressure reading.  I have provided you with information on how to take your blood pressure. I have provided you with a referral to a primary care clinic in [facility] called [facility].  I would like them to recheck your blood pressure to see if blood pressure medications need to be started as well as follow up with them regarding your medication management.  I have also provided you with a referral to the behavioral health clinic ([name]) in [place] that can help you with management of your schizophrenia and depression.   Please contact both offices for follow-up of your care and continuation of prescriptions.

## 2023-09-07 NOTE — ED Triage Notes (Signed)
 Pt to ED for trazadone and olanzapine  refill. Pt unsure of dosage. Has been out for about 1 month. Pt uses [facility] in [place].

## 2023-09-07 NOTE — ED Provider Notes (Signed)
 [facility] Provider Note    Event Date/Time   First MD Initiated Contact with Patient 09/07/23 1128     (approximate)   History   Medication Refill   HPI  Ethan Sutton is a 38 y.o. male  with a past medical history of schizophrenia, schizophreniform disorder, amphetamine abuse, major depressive disorder, malingering presents to the emergency department medication refill of trazodone  50 mg p.o. nightly as needed for sleep and olanzapine  15 mg p.o. nightly.  Patient states he has been out of these medications for roughly 1 month.  Denies suicidal ideation, homicidal ideation, hallucinations, shortness of breath, chest pain, dizziness, vision changes, nausea or any other concerns at this time.  Per chart review, patient had ambulatory referral to primary care provided on his last 2 visits here for the same medication refills on 07/07/23 and 03/31/23, but states they wanted to send them to [facility] which is too far for him to drive.     Physical Exam   Triage Vital Signs: ED Triage Vitals  Encounter Vitals Group     BP 09/07/23 1107 (!) 130/103     Girls Systolic BP Percentile --      Girls Diastolic BP Percentile --      Boys Systolic BP Percentile --      Boys Diastolic BP Percentile --      Pulse Rate 09/07/23 1107 (!) 106     Resp 09/07/23 1107 20     Temp 09/07/23 1107 99.1 F (37.3 C)     Temp Source 09/07/23 1107 Oral     SpO2 09/07/23 1107 97 %     Weight 09/07/23 1108 175 lb (79.4 kg)     Height 09/07/23 1108 6' (1.829 m)     Head Circumference --      Peak Flow --      Pain Score 09/07/23 1107 0     Pain Loc --      Pain Education --      Exclude from Growth Chart --     Most recent vital signs: Vitals:   09/07/23 1107  BP: (!) 130/103  Pulse: (!) 106  Resp: 20  Temp: 99.1 F (37.3 C)  SpO2: 97%    General: Awake, in no acute distress. Appears stated age. CV: Good peripheral perfusion. No edema. Respiratory: No respiratory  distress. Normal respiratory effort. GI: Soft, non-distended. Skin:Warm, dry, intact.  Neurological: A&Ox4 to person, place, time, and situation.  Psychiatric: Mood and affect appropriate, cooperative. Thought processes coherent.    ED Results / Procedures / Treatments   Labs (all labs ordered are listed, but only abnormal results are displayed) Labs Reviewed - No data to display   EKG     RADIOLOGY     PROCEDURES:  Critical Care performed: No   Procedures   MEDICATIONS ORDERED IN ED: Medications - No data to display   IMPRESSION / MDM / ASSESSMENT AND PLAN / ED COURSE  I reviewed the triage vital signs and the nursing notes.                              Differential diagnosis includes, but is not limited to, medication refill, schizophrenia, depression  Patient's presentation is most consistent with acute, uncomplicated illness.  37 year old male presenting for medication refill.  Patient is in no acute distress on exam.  No physical complaints or concerns at this time.  Denies SI and  HI. BP elevated at 130/103 and HR 106 bpm, repeat is 125/71 and 87 bpm. Provided patient with 30 days of olanzapine  and trazodone .  Medication was sent to the patient's preferred pharmacy.  Also provided information for follow-up with [name] behavioral [facility] as well as the [facility] [place] to help with establishing primary care closer to home compared to prior referrals to primary care offices in [place], which is a barrier for him.   Patient was given the opportunity to ask questions; all questions were answered. Emergency department return precautions were discussed with the patient.  Patient is in agreement to the treatment plan.  Patient is stable for discharge.   FINAL CLINICAL IMPRESSION(S) / ED DIAGNOSES   Final diagnoses:  Medication refill     Rx / DC Orders   ED Discharge Orders          Ordered    OLANZapine  (ZYPREXA ) 15 MG tablet  Daily at  bedtime        09/07/23 1139    traZODone  (DESYREL ) 50 MG tablet  At bedtime PRN        09/07/23 1139    Ambulatory Referral to Primary Care        09/07/23 1146             Note:  This document was prepared using Dragon voice recognition software and may include unintentional dictation errors.     Sheron Salm, PA-C 09/07/23 1245    Levander Slate, MD 09/07/23 [phone]

## 2023-09-12 ENCOUNTER — Encounter: Payer: Self-pay | Admitting: Psychiatry

## 2023-09-12 ENCOUNTER — Other Ambulatory Visit: Payer: Self-pay

## 2023-09-12 ENCOUNTER — Emergency Department
Admission: EM | Admit: 2023-09-12 | Discharge: 2023-09-12 | Disposition: A | Discharge status: Other Acute Inpatient Hospital | Payer: 59 | Attending: Emergency Medicine | Admitting: Emergency Medicine

## 2023-09-12 ENCOUNTER — Inpatient Hospital Stay
Admission: EM | Admit: 2023-09-12 | Discharge: 2023-09-15 | DRG: 885 | Disposition: A | Discharge status: Home / Self Care | Payer: 59 | Source: Ambulatory Visit | Attending: Psychiatry | Admitting: Psychiatry

## 2023-09-12 DIAGNOSIS — Z818 Family history of other mental and behavioral disorders: Secondary | ICD-10-CM | POA: Diagnosis not present

## 2023-09-12 DIAGNOSIS — F419 Anxiety disorder, unspecified: Secondary | ICD-10-CM | POA: Diagnosis present

## 2023-09-12 DIAGNOSIS — F209 Schizophrenia, unspecified: Secondary | ICD-10-CM | POA: Diagnosis present

## 2023-09-12 DIAGNOSIS — F332 Major depressive disorder, recurrent severe without psychotic features: Principal | ICD-10-CM | POA: Diagnosis present

## 2023-09-12 DIAGNOSIS — R45851 Suicidal ideations: Secondary | ICD-10-CM | POA: Insufficient documentation

## 2023-09-12 DIAGNOSIS — F29 Unspecified psychosis not due to a substance or known physiological condition: Secondary | ICD-10-CM | POA: Insufficient documentation

## 2023-09-12 DIAGNOSIS — Z5982 Transportation insecurity: Secondary | ICD-10-CM | POA: Diagnosis not present

## 2023-09-12 DIAGNOSIS — Z91148 Patient's other noncompliance with medication regimen for other reason: Secondary | ICD-10-CM | POA: Diagnosis not present

## 2023-09-12 DIAGNOSIS — F259 Schizoaffective disorder, unspecified: Principal | ICD-10-CM | POA: Insufficient documentation

## 2023-09-12 LAB — CBC WITH DIFFERENTIAL/PLATELET
Abs Immature Granulocytes: 0.07 K/uL (ref 0.00–0.07)
Basophils Absolute: 0.1 K/uL (ref 0.0–0.1)
Basophils Relative: 0 %
Eosinophils Absolute: 0.3 K/uL (ref 0.0–0.5)
Eosinophils Relative: 2 %
HCT: 49.7 % (ref 39.0–52.0)
Hemoglobin: 17 g/dL (ref 13.0–17.0)
Immature Granulocytes: 1 %
Lymphocytes Relative: 16 %
Lymphs Abs: 1.8 K/uL (ref 0.7–4.0)
MCH: 30.6 pg (ref 26.0–34.0)
MCHC: 34.2 g/dL (ref 30.0–36.0)
MCV: 89.5 fL (ref 80.0–100.0)
Monocytes Absolute: 0.6 K/uL (ref 0.1–1.0)
Monocytes Relative: 5 %
Neutro Abs: 8.9 K/uL — ABNORMAL HIGH (ref 1.7–7.7)
Neutrophils Relative %: 76 %
Platelets: 282 K/uL (ref 150–400)
RBC: 5.55 MIL/uL (ref 4.22–5.81)
RDW: 12.7 % (ref 11.5–15.5)
WBC: 11.8 K/uL — ABNORMAL HIGH (ref 4.0–10.5)
nRBC: 0 % (ref 0.0–0.2)

## 2023-09-12 LAB — URINE DRUG SCREEN, QUALITATIVE (ARMC ONLY)
Amphetamines, Ur Screen: NOT DETECTED
Barbiturates, Ur Screen: NOT DETECTED
Benzodiazepine, Ur Scrn: NOT DETECTED
Cannabinoid 50 Ng, Ur ~~LOC~~: NOT DETECTED
Cocaine Metabolite,Ur ~~LOC~~: NOT DETECTED
MDMA (Ecstasy)Ur Screen: NOT DETECTED
Methadone Scn, Ur: NOT DETECTED
Opiate, Ur Screen: NOT DETECTED
Phencyclidine (PCP) Ur S: NOT DETECTED
Tricyclic, Ur Screen: NOT DETECTED

## 2023-09-12 LAB — COMPREHENSIVE METABOLIC PANEL WITH GFR
ALT: 23 U/L (ref 0–44)
AST: 23 U/L (ref 15–41)
Albumin: 4.5 g/dL (ref 3.5–5.0)
Alkaline Phosphatase: 86 U/L (ref 38–126)
Anion gap: 11 (ref 5–15)
BUN: 6 mg/dL (ref 6–20)
CO2: 28 mmol/L (ref 22–32)
Calcium: 9.3 mg/dL (ref 8.9–10.3)
Chloride: 101 mmol/L (ref 98–111)
Creatinine, Ser: 0.91 mg/dL (ref 0.61–1.24)
GFR, Estimated: >60 mL/min (ref >60)
Glucose, Bld: 94 mg/dL (ref 70–99)
Potassium: 3.4 mmol/L — ABNORMAL LOW (ref 3.5–5.1)
Sodium: 140 mmol/L (ref 135–145)
Total Bilirubin: 0.9 mg/dL (ref 0.0–1.2)
Total Protein: 8.5 g/dL — ABNORMAL HIGH (ref 6.5–8.1)

## 2023-09-12 LAB — ACETAMINOPHEN LEVEL: Acetaminophen (Tylenol), Serum: <10 ug/mL — ABNORMAL LOW (ref 10–30)

## 2023-09-12 LAB — SALICYLATE LEVEL: Salicylate Lvl: <7 mg/dL — ABNORMAL LOW (ref 7.0–30.0)

## 2023-09-12 LAB — ETHANOL: Alcohol, Ethyl (B): <15 mg/dL (ref <15)

## 2023-09-12 MED ORDER — DIPHENHYDRAMINE HCL 50 MG/ML IJ SOLN
50.0000 mg | Freq: Three times a day (TID) | INTRAMUSCULAR | Status: DC | PRN
Start: 2023-09-12 — End: 2023-09-15

## 2023-09-12 MED ORDER — MAGNESIUM HYDROXIDE 400 MG/5ML PO SUSP: 30.0000 mL | Freq: Every day | ORAL | Status: DC | PRN

## 2023-09-12 MED ORDER — DIPHENHYDRAMINE HCL 50 MG/ML IJ SOLN: 50.0000 mg | Freq: Three times a day (TID) | INTRAMUSCULAR | Status: DC | PRN

## 2023-09-12 MED ORDER — OLANZAPINE 10 MG PO TABS
10.0000 mg | ORAL_TABLET | Freq: Two times a day (BID) | ORAL | Status: DC
  Administered 2023-09-12 – 2023-09-15 (×6): 10 mg via ORAL
  Filled 2023-09-12 (×6): qty 1

## 2023-09-12 MED ORDER — LORAZEPAM 2 MG/ML IJ SOLN
2.0000 mg | Freq: Three times a day (TID) | INTRAMUSCULAR | Status: DC | PRN
Start: 2023-09-12 — End: 2023-09-15

## 2023-09-12 MED ORDER — TRAZODONE HCL 50 MG PO TABS
50.0000 mg | ORAL_TABLET | Freq: Every evening | ORAL | Status: DC | PRN
  Administered 2023-09-12 – 2023-09-14 (×3): 50 mg via ORAL
  Filled 2023-09-12 (×3): qty 1

## 2023-09-12 MED ORDER — ACETAMINOPHEN 325 MG PO TABS: 650.0000 mg | ORAL_TABLET | Freq: Four times a day (QID) | ORAL | Status: DC | PRN

## 2023-09-12 MED ORDER — OLANZAPINE 10 MG PO TABS
10.0000 mg | ORAL_TABLET | Freq: Two times a day (BID) | ORAL | Status: DC
  Administered 2023-09-12: 10 mg via ORAL
  Filled 2023-09-12: qty 1

## 2023-09-12 MED ORDER — HALOPERIDOL 5 MG PO TABS: 5.0000 mg | ORAL_TABLET | Freq: Three times a day (TID) | ORAL | Status: DC | PRN

## 2023-09-12 MED ORDER — HALOPERIDOL LACTATE 5 MG/ML IJ SOLN: 10.0000 mg | Freq: Three times a day (TID) | INTRAMUSCULAR | Status: DC | PRN

## 2023-09-12 MED ORDER — HALOPERIDOL LACTATE 5 MG/ML IJ SOLN: 5.0000 mg | Freq: Three times a day (TID) | INTRAMUSCULAR | Status: DC | PRN

## 2023-09-12 MED ORDER — DIPHENHYDRAMINE HCL 25 MG PO CAPS: 50.0000 mg | ORAL_CAPSULE | Freq: Three times a day (TID) | ORAL | Status: DC | PRN

## 2023-09-12 MED ORDER — LORAZEPAM 2 MG/ML IJ SOLN: 2.0000 mg | Freq: Three times a day (TID) | INTRAMUSCULAR | Status: DC | PRN

## 2023-09-12 MED ORDER — ALUM & MAG HYDROXIDE-SIMETH 200-200-20 MG/5ML PO SUSP: 30.0000 mL | ORAL | Status: DC | PRN

## 2023-09-12 MED ORDER — HYDROXYZINE HCL 25 MG PO TABS
25.0000 mg | ORAL_TABLET | Freq: Four times a day (QID) | ORAL | Status: DC | PRN
Start: 2023-09-12 — End: 2023-09-15
  Administered 2023-09-12 – 2023-09-14 (×2): 25 mg via ORAL
  Filled 2023-09-12 (×2): qty 1

## 2023-09-12 NOTE — ED Notes (Signed)
 RN attempted to call report. RN stating unable to take report until MD orders are placed. Awaiting call back for report to be given

## 2023-09-12 NOTE — ED Notes (Addendum)
 Pt Breakfast provided at bedside

## 2023-09-12 NOTE — ED Notes (Signed)
Pt refused snacks.

## 2023-09-12 NOTE — Group Note (Signed)
 Date:  09/12/2023 Time:  11:24 PM    Additional Comments:  Did not attend Group  Ethan Sutton 09/12/2023, 11:24 PM

## 2023-09-12 NOTE — Progress Notes (Signed)
   09/12/23 1503  Charting Type  Charting Type Admission  Focused Reassessment No Changes Psychosocial  Focused Reassessment Changes Noted Psychosocial  Safety Check Verification  Has the RN verified the 15 minute safety check completion? Yes  Neurological  Neuro (WDL) WDL  Orientation Level Oriented X4  Cognition Appropriate at baseline  Speech Clear  HEENT  HEENT (WDL) WDL  Respiratory  Respiratory Pattern Regular;Unlabored  Chest Assessment Chest expansion symmetrical  Respiratory (WDL) WDL  Cardiac  Cardiac (WDL) WDL  Vascular  Vascular (WDL) WDL  Integumentary  Integumentary (WDL) WDL  Braden Scale (Ages 8 and up)  Sensory Perceptions 4  Moisture 4  Activity 4  Mobility 4  Nutrition 3  Friction and Shear 3  Braden Scale Score 22  Musculoskeletal  Musculoskeletal (WDL) WDL  Gastrointestinal  Gastrointestinal (WDL) WDL  Last BM Date  09/11/23  GU Assessment  Genitourinary (WDL) WDL  Genitalia  Male Genitalia Intact  Neurological  Level of Consciousness Alert

## 2023-09-12 NOTE — ED Triage Notes (Signed)
 Pt BIB [facility] from home. Pt reports SI, but denies plan. Per EMS states pt feels like his medication is not working. Pt states when he does not take his meds he feels violent/aggressive. Pt has not missed a dose of medication at this time. Pt takes Olanzapine  10mg  and Trazodone  50mg .   Past Medical History:  Diagnosis Date   Depression 02/09/2023   Schizophrenia (HCC)

## 2023-09-12 NOTE — Group Note (Signed)
 Recreation Therapy Group Note   Group Topic:Leisure Education  Group Date: 09/12/2023 Start Time: 1530 End Time: 1635 Facilitators: Celestia Jeoffrey FORBES ARTICE, CTRS Location: Craft Room  Group Description: Leisure. Patients were given the option to choose from singing karaoke, coloring mandalas, using oil pastels, journaling, painting or playing with play-doh. LRT and pts discussed the meaning of leisure, the importance of participating in leisure during their free time/when they're outside of the hospital, as well as how our leisure interests can also serve as coping skills.   Goal Area(s) Addressed:  Patient will identify a current leisure interest.  Patient will learn the definition of "leisure". Patient will practice making a positive decision. Patient will have the opportunity to try a new leisure activity. Patient will communicate with peers and LRT.    Affect/Mood: N/A   Participation Level: Did not attend    Clinical Observations/Individualized Feedback: Patient did not attend group.   Plan: Continue to engage patient in RT group sessions 2-3x/week.   [address] LRT, CTRS 09/12/2023 5:20 PM

## 2023-09-12 NOTE — Plan of Care (Signed)
 Problem: Education: Goal: Knowledge of [facility] Education information/materials will improve 09/12/2023 1714 by Shirley Jon FALCON, RN Outcome: Not Progressing 09/12/2023 1559 by Shirley Jon FALCON, RN Outcome: Not Progressing Goal: Emotional status will improve 09/12/2023 1714 by Shirley Jon FALCON, RN Outcome: Not Progressing 09/12/2023 1559 by Shirley Jon FALCON, RN Outcome: Not Progressing Goal: Mental status will improve 09/12/2023 1714 by Shirley Jon FALCON, RN Outcome: Not Progressing 09/12/2023 1559 by Shirley Jon FALCON, RN Outcome: Not Progressing Goal: Verbalization of understanding the information provided will improve 09/12/2023 1714 by Shirley Jon FALCON, RN Outcome: Not Progressing 09/12/2023 1559 by Shirley Jon FALCON, RN Outcome: Not Progressing   Problem: Activity: Goal: Interest or engagement in activities will improve 09/12/2023 1714 by Shirley Jon FALCON, RN Outcome: Not Progressing 09/12/2023 1559 by Shirley Jon FALCON, RN Outcome: Not Progressing Goal: Sleeping patterns will improve 09/12/2023 1714 by Shirley Jon FALCON, RN Outcome: Not Progressing 09/12/2023 1559 by Shirley Jon FALCON, RN Outcome: Not Progressing   Problem: Coping: Goal: Ability to verbalize frustrations and anger appropriately will improve 09/12/2023 1714 by Shirley Jon FALCON, RN Outcome: Not Progressing 09/12/2023 1559 by Shirley Jon FALCON, RN Outcome: Not Progressing Goal: Ability to demonstrate self-control will improve 09/12/2023 1714 by Shirley Jon FALCON, RN Outcome: Not Progressing 09/12/2023 1559 by Shirley Jon FALCON, RN Outcome: Not Progressing   Problem: Health Behavior/Discharge Planning: Goal: Identification of resources available to assist in meeting health care needs will improve 09/12/2023 1714 by Shirley Jon FALCON, RN Outcome: Not Progressing 09/12/2023 1559 by Shirley Jon FALCON, RN Outcome: Not Progressing Goal: Compliance with treatment plan for underlying cause of  condition will improve 09/12/2023 1714 by Shirley Jon FALCON, RN Outcome: Not Progressing 09/12/2023 1559 by Shirley Jon FALCON, RN Outcome: Not Progressing   Problem: Physical Regulation: Goal: Ability to maintain clinical measurements within normal limits will improve 09/12/2023 1714 by Shirley Jon FALCON, RN Outcome: Not Progressing 09/12/2023 1559 by Shirley Jon FALCON, RN Outcome: Not Progressing   Problem: Safety: Goal: Periods of time without injury will increase 09/12/2023 1714 by Shirley Jon FALCON, RN Outcome: Not Progressing 09/12/2023 1559 by Shirley Jon FALCON, RN Outcome: Not Progressing   Problem: Education: Goal: Ability to make informed decisions regarding treatment will improve 09/12/2023 1714 by Shirley Jon FALCON, RN Outcome: Not Progressing 09/12/2023 1559 by Shirley Jon FALCON, RN Outcome: Not Progressing   Problem: Coping: Goal: Coping ability will improve 09/12/2023 1714 by Shirley Jon FALCON, RN Outcome: Not Progressing 09/12/2023 1559 by Shirley Jon FALCON, RN Outcome: Not Progressing   Problem: Health Behavior/Discharge Planning: Goal: Identification of resources available to assist in meeting health care needs will improve 09/12/2023 1714 by Shirley Jon FALCON, RN Outcome: Not Progressing 09/12/2023 1559 by Shirley Jon FALCON, RN Outcome: Not Progressing   Problem: Medication: Goal: Compliance with prescribed medication regimen will improve 09/12/2023 1714 by Shirley Jon FALCON, RN Outcome: Not Progressing 09/12/2023 1559 by Shirley Jon FALCON, RN Outcome: Not Progressing   Problem: Self-Concept: Goal: Ability to disclose and discuss suicidal ideas will improve 09/12/2023 1714 by Shirley Jon FALCON, RN Outcome: Not Progressing 09/12/2023 1559 by Shirley Jon FALCON, RN Outcome: Not Progressing Goal: Will verbalize positive feelings about self 09/12/2023 1714 by Shirley Jon FALCON, RN Outcome: Not Progressing Note: Patient is initiating therapy. Patient will work on  increased adherence  09/12/2023 1559 by Shirley Jon FALCON, RN Outcome: Not Progressing Note: Patient is not on track and no change. Patient will work on increased adherence and avoid flare triggers

## 2023-09-12 NOTE — ED Provider Notes (Signed)
 [facility] Provider Note    Event Date/Time   First MD Initiated Contact with Patient 09/12/23 1006     (approximate)   History   Psychiatric Evaluation   HPI  Reyaan Thoma is a 38 y.o. male  schizophrenia, schizophreniform disorder, amphetamine abuse, major depressive disorder, malingering who comes in requesting for psychiatric evaluation.  Patient reports SI but denies any plan.  He reports he has been taking his medications but he states that they are not working.  He does report feeling aggressive and violent when he does not take his medications but denies missing any.  He is on trazodone  50 and olanzapine  10.  He denies any attempts to harm himself and denies any other medical concerns.   Physical Exam   Triage Vital Signs: ED Triage Vitals  Encounter Vitals Group     BP 09/12/23 1009 (!) 129/90     Girls Systolic BP Percentile --      Girls Diastolic BP Percentile --      Boys Systolic BP Percentile --      Boys Diastolic BP Percentile --      Pulse Rate 09/12/23 1009 (!) 101     Resp 09/12/23 1009 18     Temp 09/12/23 1009 98.6 F (37 C)     Temp Source 09/12/23 1009 Oral     SpO2 09/12/23 1009 98 %     Weight --      Height --      Head Circumference --      Peak Flow --      Pain Score 09/12/23 1010 0     Pain Loc --      Pain Education --      Exclude from Growth Chart --     Most recent vital signs: Vitals:   09/12/23 1009  BP: (!) 129/90  Pulse: (!) 101  Resp: 18  Temp: 98.6 F (37 C)  SpO2: 98%     General: Awake, no distress.  CV:  Good peripheral perfusion.  Resp:  Normal effort.  Abd:  No distention.  Other:  Positive SI Patient is moving all extremities.   ED Results / Procedures / Treatments   Labs (all labs ordered are listed, but only abnormal results are displayed) Labs Reviewed  COMPREHENSIVE METABOLIC PANEL WITH GFR  ETHANOL  URINE DRUG SCREEN, QUALITATIVE ([facility] ONLY)  CBC WITH  DIFFERENTIAL/PLATELET  SALICYLATE LEVEL  ACETAMINOPHEN  LEVEL    PROCEDURES:  Critical Care performed: No  Procedures   MEDICATIONS ORDERED IN ED: Medications - No data to display   IMPRESSION / MDM / ASSESSMENT AND PLAN / ED COURSE  I reviewed the triage vital signs and the nursing notes.   Patient's presentation is most consistent with acute presentation with potential threat to life or bodily function.   Pt is without any acute medical complaints. No exam findings to suggest medical cause of current presentation. Will order psychiatric screening labs and discuss further w/ psychiatric service.  D/d includes but is not limited to psychiatric disease, behavioral/personality disorder, inadequate socioeconomic support, medical.  Based on HPI, exam, unremarkable labs, no concern for acute medical problem at this time. No rigidity, clonus, hyperthermia, focal neurologic deficit, diaphoresis, tachycardia, meningismus, ataxia, gait abnormality or other finding to suggest this visit represents a non-psychiatric problem. Screening labs reviewed.    Given this, pt medically cleared, to be dispositioned per Psych.    The patient has been placed in psychiatric observation due  to the need to provide a safe environment for the patient while obtaining psychiatric consultation and evaluation, as well as ongoing medical and medication management to treat the patient's condition.  The patient has not been placed under full IVC at this time.       FINAL CLINICAL IMPRESSION(S) / ED DIAGNOSES   Final diagnoses:  Suicidal ideation     Rx / DC Orders   ED Discharge Orders     None        Note:  This document was prepared using Dragon voice recognition software and may include unintentional dictation errors.   Ernest Ronal BRAVO, MD 09/12/23 1030

## 2023-09-12 NOTE — Plan of Care (Signed)
  Problem: Education: Goal: Knowledge of [facility] Education information/materials will improve Outcome: Not Progressing Goal: Emotional status will improve Outcome: Not Progressing Goal: Mental status will improve Outcome: Not Progressing Goal: Verbalization of understanding the information provided will improve Outcome: Not Progressing   Problem: Activity: Goal: Interest or engagement in activities will improve Outcome: Not Progressing Goal: Sleeping patterns will improve Outcome: Not Progressing   Problem: Coping: Goal: Ability to verbalize frustrations and anger appropriately will improve Outcome: Not Progressing Goal: Ability to demonstrate self-control will improve Outcome: Not Progressing   Problem: Health Behavior/Discharge Planning: Goal: Identification of resources available to assist in meeting health care needs will improve Outcome: Not Progressing Goal: Compliance with treatment plan for underlying cause of condition will improve Outcome: Not Progressing   Problem: Physical Regulation: Goal: Ability to maintain clinical measurements within normal limits will improve Outcome: Not Progressing   Problem: Safety: Goal: Periods of time without injury will increase Outcome: Not Progressing   Problem: Education: Goal: Ability to make informed decisions regarding treatment will improve Outcome: Not Progressing   Problem: Coping: Goal: Coping ability will improve Outcome: Not Progressing   Problem: Health Behavior/Discharge Planning: Goal: Identification of resources available to assist in meeting health care needs will improve Outcome: Not Progressing   Problem: Medication: Goal: Compliance with prescribed medication regimen will improve Outcome: Not Progressing   Problem: Self-Concept: Goal: Ability to disclose and discuss suicidal ideas will improve Outcome: Not Progressing Goal: Will verbalize positive feelings about self Outcome: Not  Progressing Note: Patient is not on track and no change. Patient will work on increased adherence and avoid flare triggers

## 2023-09-12 NOTE — Consult Note (Signed)
 Patient is a 38 year old African American male who was seen face-to-face today in the emergency room he lives with his mother reportedly has a diagnosis of primary psychotic disorder amphetamine abuse and comes into the emergency room reporting that he has not followed up in outpatient much and previously was admitted to [facility] for a week and he was supposed to be getting Zyprexa  and trazodone  and reportedly has not been taking the medications according to the mother he gets aggressive when he is not taking medications or the medications are not optimal he has been pacing in the hallways and talking to himself and more labile lately.  Patient is currently endorsing vague suicidal thoughts with no specific plan denies any homicidal thoughts   Mental status examination patient is a 38 year old male who appears stated age alert oriented times 3 mood is okay affect is constricted thought process concrete no overt psychosis noted as such no report of any homicidal thoughts vague report of suicidal thoughts with no specific plan insight and judgment limited   Assessment psychosis unspecified   Recommendations or plan recommend starting him on Zyprexa  10 mg p.o. b.I.d. 1st dose now and recommend inpatient level of care.   No current facility-administered medications for this encounter.   Current Outpatient Medications  Medication Sig Dispense Refill   ibuprofen  (ADVIL ) 200 MG tablet Take 200 mg by mouth as needed for headache or mild pain (pain score 1-3).     melatonin 5 MG TABS Take 1 tablet (5 mg total) by mouth at bedtime.     OLANZapine  (ZYPREXA ) 15 MG tablet Take 1 tablet (15 mg total) by mouth at bedtime. 30 tablet 0   traZODone  (DESYREL ) 50 MG tablet Take 1 tablet (50 mg total) by mouth at bedtime as needed for sleep. 30 tablet 0    Blood pressure (!) 129/90, pulse (!) 101, temperature 98.6 F (37 C), temperature source Oral, resp. rate 18, height 6' (1.829 m), weight 80 kg, SpO2 98%.

## 2023-09-12 NOTE — BH Assessment (Addendum)
 Comprehensive Clinical Assessment (CCA) Note  09/12/2023 Ethan Sutton [id number]  Chief Complaint:  Chief Complaint  Patient presents with   Psychiatric Evaluation   Visit Diagnosis: Schizophrenia  Ethan Sutton is 38 year old male who presents to the ER requesting his medications be adjusted. He states his current regiment of medicine are no longer working for him. He is having thoughts of suicide and hallucinations. Per the report of the patient's mother (Ethan Sutton-[phone]), approximately five to six months, she noticed his medications aren't as effective. The patient has started to respond more to internal stimuli, he isn't sleeping, pacing more and saying things that aren't related to the conversations. Patient currently lives in the home with her and concerned if his behaviors continue he will no longer be able to live with her. She further explains, when is discharged from inpatient units, he is unable to attend his aftercare appointments due to lack of transportation. Both the mother and father work first shift and unable to take him to his appointments. They are requesting if he can get his medications and services come to the home, like his other sibling.   CCA Screening, Triage and Referral (STR)  Patient Reported Information How did you hear about us ? Self  What Is the Reason for Your Visit/Call Today? Patient states his medications are no longer working for him.  How Long Has This Been Causing You Problems? 1 wk - 1 month  What Do You Feel Would Help You the Most Today? Treatment for Depression or other mood problem   Have You Recently Had Any Thoughts About Hurting Yourself? Yes  Are You Planning to Commit Suicide/Harm Yourself At This time? No   Flowsheet Row ED from 09/12/2023 in [facility] Emergency Department at [facility] ED from 09/07/2023 in [facility] Emergency Department at [facility] ED from 07/07/2023 in [facility] Department at [facility]  C-SSRS RISK CATEGORY No Risk No Risk No Risk    Have you Recently Had Thoughts About Hurting Someone [place]? No  Are You Planning to Harm Someone at This Time? No  Explanation: Pt endorseds intrusive thoughts of SI with a plan to overdose on Fentanyl and OTC medications.   Have You Used Any Alcohol or Drugs in the Past 24 Hours? No  How Long Ago Did You Use Drugs or Alcohol? No data recorded What Did You Use and How Much? No data recorded  Do You Currently Have a Therapist/Psychiatrist? No  Name of Therapist/Psychiatrist:    Have You Been Recently Discharged From Any Office Practice or Programs? No  Explanation of Discharge From Practice/Program: N/A     CCA Screening Triage Referral Assessment Type of Contact: Face-to-Face  Telemedicine Service Delivery:   Is this Initial or Reassessment?   Date Telepsych consult ordered in [facility]:    Time Telepsych consult ordered in [facility]:    Location of Assessment: [facility] ED  Provider Location: [facility] ED   Collateral Involvement: None noted   Does Patient Have a Court Appointed Legal Guardian? No  Legal Guardian Contact Information: N/A  Copy of Legal Guardianship Form: -- (N/A)  Legal Guardian Notified of Arrival: -- (N/A)  Legal Guardian Notified of Pending Discharge: -- (N/A)  If Minor and Not Living with Parent(s), Who has Custody? N/A  Is CPS involved or ever been involved? Never  Is APS involved or ever been involved? Never   Patient Determined To Be At Risk for Harm To Self or Others Based on Review of Patient Reported Information  or Presenting Complaint? Yes, for Self-Harm  Method: Plan with intent and identified person  Availability of Means: No access or NA  Intent: Clearly intends on inflicting harm that could cause death  Notification Required: No need or identified person  Additional Information for Danger to Others Potential: -- (N/A)  Additional Comments for Danger to Others Potential: None  noted  Are There Guns or Other Weapons in Your Home? No  Types of Guns/Weapons: NA  Are These Weapons Safely Secured?                            -- (N/A)  Who Could Verify You Are Able To Have These Secured: NA  Do You Have any Outstanding Charges, Pending Court Dates, Parole/Probation? Pt denied having pending legal charges.  Contacted To Inform of Risk of Harm To Self or Others: -- (N/A)   Does Patient Present under Involuntary Commitment? No   [place] of Residence: [place]   Patient Currently Receiving the Following Services: Not Receiving Services   Determination of Need: Emergent (2 hours)   Options For Referral: Inpatient Hospitalization; ED Referral     CCA Biopsychosocial Patient Reported Schizophrenia/Schizoaffective Diagnosis in Past: Yes   Strengths: Stable housing, some insight and a support system.   Mental Health Symptoms Depression:  Change in energy/activity; Difficulty Concentrating   Duration of Depressive symptoms: Duration of Depressive Symptoms: N/A   Mania:  Increased Energy; Racing thoughts; Recklessness   Anxiety:   Difficulty concentrating; Restlessness; Sleep; Tension; Worrying   Psychosis:  Hallucinations   Duration of Psychotic symptoms: Duration of Psychotic Symptoms: N/A   Trauma:  N/A   Obsessions:  N/A   Compulsions:  N/A   Inattention:  N/A   Hyperactivity/Impulsivity:  N/A   Oppositional/Defiant Behaviors:  N/A   Emotional Irregularity:  N/A   Other Mood/Personality Symptoms:  None noted    Mental Status Exam Appearance and self-care  Stature:  Average   Weight:  Average weight   Clothing:  Neat/clean; Age-appropriate   Grooming:  Normal   Cosmetic use:  None   Posture/gait:  Normal   Motor activity:  -- (Within normal range)   Sensorium  Attention:  Normal   Concentration:  Scattered   Orientation:  X5   Recall/memory:  Normal   Affect and Mood  Affect:  Appropriate   Mood:  Depressed;  Anxious   Relating  Eye contact:  Normal   Facial expression:  Anxious; Responsive   Attitude toward examiner:  Cooperative   Thought and Language  Speech flow: Clear and Coherent; Slow   Thought content:  Appropriate to Mood and Circumstances   Preoccupation:  None   Hallucinations:  Auditory   Organization:  Linear; Coherent   Company secretary of Knowledge:  Fair   Intelligence:  Average   Abstraction:  Functional   Judgement:  Fair   Dance movement psychotherapist:  Adequate   Insight:  Fair   Decision Making:  Confused   Social Functioning  Social Maturity:  Responsible   Social Judgement:  Chief of Staff; Normal   Stress  Stressors:  Transitions   Coping Ability:  Human resources officer Deficits:  None   Supports:  Family     Religion: Religion/Spirituality Are You A Religious Person?: No  Leisure/Recreation: Leisure / Recreation Do You Have Hobbies?: No  Exercise/Diet: Exercise/Diet Do You Exercise?: No Have You Gained or Lost A Significant Amount of Weight in the Past  Six Months?: No Do You Follow a Special Diet?: No Do You Have Any Trouble Sleeping?: Yes Explanation of Sleeping Difficulties: Patient recently went two days without sleeping.   CCA Employment/Education Employment/Work Situation: Employment / Work Situation Employment Situation: Unemployed Patient's Job has Been Impacted by Current Illness: No Has Patient ever Been in Equities trader?: No  Education: Education Is Patient Currently Attending School?: No Did Theme park manager?: No Did You Have An Individualized Education Program (IIEP): No Did You Have Any Difficulty At [employer]?: No Patient's Education Has Been Impacted by Current Illness: No   CCA Family/Childhood History Family and Relationship History: Family history Marital status: Single Does patient have children?: No  Childhood History:  Childhood History By whom was/is the patient raised?: Both parents Did  patient suffer any verbal/emotional/physical/sexual abuse as a child?: No Did patient suffer from severe childhood neglect?: No Has patient ever been sexually abused/assaulted/raped as an adolescent or adult?: No Was the patient ever a victim of a crime or a disaster?: No Witnessed domestic violence?: No Has patient been affected by domestic violence as an adult?: No   CCA Substance Use Alcohol/Drug Use: Alcohol / Drug Use Pain Medications: See MAR Prescriptions: See MAR Over the Counter: See MAR History of alcohol / drug use?: No history of alcohol / drug abuse Longest period of sobriety (when/how long): n/a     ASAM's:  Six Dimensions of Multidimensional Assessment  Dimension 1:  Acute Intoxication and/or Withdrawal Potential:      Dimension 2:  Biomedical Conditions and Complications:      Dimension 3:  Emotional, Behavioral, or Cognitive Conditions and Complications:     Dimension 4:  Readiness to Change:     Dimension 5:  Relapse, Continued use, or Continued Problem Potential:     Dimension 6:  Recovery/Living Environment:     ASAM Severity Score:    ASAM Recommended Level of Treatment:     Substance use Disorder (SUD)    Recommendations for Services/Supports/Treatments:    Disposition Recommendation per psychiatric provider: We recommend inpatient psychiatric hospitalization when medically cleared. Patient is under voluntary admission status at this time; please IVC if attempts to leave hospital.   DSM5 Diagnoses: Patient Active Problem List   Diagnosis Date Noted   Depression 02/09/2023   Major depressive disorder, recurrent episode (HCC) 01/22/2023   Suicidal ideation 01/12/2023   Schizophreniform disorder (HCC) 12/27/2022   Malingering 12/24/2022   Acute psychosis (HCC) 05/25/2019   Amphetamine abuse (HCC) 05/25/2019   Schizophrenia (HCC) 05/14/2019    Referrals to Alternative Service(s): Referred to Alternative Service(s):   Place:   Date:   Time:     Referred to Alternative Service(s):   Place:   Date:   Time:    Referred to Alternative Service(s):   Place:   Date:   Time:    Referred to Alternative Service(s):   Place:   Date:   Time:     Kiki DOROTHA Barge MS, LCAS, [facility], [facility] Therapeutic Triage Specialist 09/12/2023 1:09 PM

## 2023-09-12 NOTE — Progress Notes (Signed)
 Pt calm and pleasant during assessment denying SI/HI/AVH. Pt observed by this Clinical research associate interacting appropriately with staff and peers on the unit. Pt compliant with medication administration per MD orders. Pt given education, support, and encouragement to be active in his treatment plan. Pt being monitored Q 15 minutes for safety per unit protocol, remains safe on the unit

## 2023-09-12 NOTE — ED Notes (Addendum)
 Pt requesting at home medications. EDP made aware.

## 2023-09-12 NOTE — Progress Notes (Signed)
   09/12/23 1503  Psych Admission Type (Psych Patients Only)  Admission Status Voluntary  Psychosocial Assessment  Patient Complaints Self-harm thoughts (per ED report but pt denies everything)  Eye Contact Brief  Facial Expression Animated  Affect Appropriate to circumstance  Speech Soft  Interaction Assertive  Motor Activity Slow  Appearance/Hygiene Body odor;Disheveled  Behavior Characteristics Appropriate to situation  Mood Empty  Aggressive Behavior  Targets Other (Comment) (SI per ED report; pt denies on admission)  Effect No apparent injury  Thought Process  Coherency WDL  Content WDL  Delusions None reported or observed  Perception WDL  Hallucination None reported or observed  Judgment Poor  Confusion WDL  Danger to Self  Current suicidal ideation? Denies (had passive SI per ED report)  Danger to Others  Danger to Others None reported or observed

## 2023-09-12 NOTE — ED Notes (Signed)
 Personal Belongings   Blue jeans [place]nor Tank top [employer]ebok Boxer  Champion Dark Blue hoodie Wheat boots [place]-Dark blue  Black Du Rag Gray Socks (2) pill bottles

## 2023-09-12 NOTE — Tx Team (Signed)
 Initial Treatment Plan 09/12/2023 3:59 PM [name] FMW:[id number]    PATIENT STRESSORS: Other: none identified; pt did not disclose     PATIENT STRENGTHS: Supportive family/friends    PATIENT IDENTIFIED PROBLEMS: Mood is labile  Suicidal ideation per report from ED; pt now denies                   DISCHARGE CRITERIA:  Ability to meet basic life and health needs Adequate post-discharge living arrangements Improved stabilization in mood, thinking, and/or behavior  PRELIMINARY DISCHARGE PLAN: Return to previous living arrangement  PATIENT/FAMILY INVOLVEMENT: This treatment plan has been presented to and reviewed with the patient, Ethan Sutton, and/or family member, parents; pt lives with mom.  The patient and family have been given the opportunity to ask questions and make suggestions.  Jon JULIANNA Lucks, RN 09/12/2023, 3:59 PM

## 2023-09-13 DIAGNOSIS — F332 Major depressive disorder, recurrent severe without psychotic features: Principal | ICD-10-CM

## 2023-09-13 DIAGNOSIS — F259 Schizoaffective disorder, unspecified: Secondary | ICD-10-CM | POA: Diagnosis not present

## 2023-09-13 MED ORDER — FLUOXETINE HCL 10 MG PO CAPS
10.0000 mg | ORAL_CAPSULE | Freq: Every day | ORAL | Status: DC
  Administered 2023-09-13 – 2023-09-15 (×3): 10 mg via ORAL
  Filled 2023-09-13 (×3): qty 1

## 2023-09-13 NOTE — [facility] Suicide Risk Assessment (Signed)
 [facility] Admission Suicide Risk Assessment   Nursing information obtained from:  Patient Demographic factors:  Male, Low socioeconomic status, Unemployed Current Mental Status:  NA Loss Factors:  NA Historical Factors:  Impulsivity Risk Reduction Factors:  Living with another person, especially a relative  Total Time spent with patient: 1 hour Principal Problem: Schizoaffective disorder (HCC) Diagnosis:  Principal Problem:   Schizoaffective disorder (HCC) Active Problems:   MDD (major depressive disorder), recurrent severe, without psychosis (HCC)  Subjective Data: 38 year old male with a history schizoaffective disorder and amphetamine abuse presenting with suicidal ideation, auditory hallucinations, medication noncompliance, and functional decline. Symptoms are complicated by limited outpatient engagement, poor transportation resources, and reliance on family for support. Collateral reports significant behavioral dysregulation and psychotic symptoms, consistent with decompensation in the context of suboptimal or absent pharmacotherapy.   Risk is elevated due to vague suicidal thoughts, recent hallucinations, impaired judgment, poor adherence, and inability to reliably follow outpatient treatment. Protective factors include supportive family, stable housing, and willingness to seek care. The patient would benefit from inpatient psychiatric admission for stabilization, medication adjustment, and development of a more reliable follow-up plan. Given barriers to outpatient care, referral to Assertive Community Treatment (ACT) or home-based psychiatric services is strongly recommended. He has signed the 72 hour form at this juncture we will continue to monitor.   Continued Clinical Symptoms:  Alcohol Use Disorder Identification Test Final Score (AUDIT): 2 The Alcohol Use Disorders Identification Test, Guidelines for Use in Primary Care, Second Edition.  World Science writer [facility]). Score between  0-7:  no or low risk or alcohol related problems. Score between 8-15:  moderate risk of alcohol related problems. Score between 16-19:  high risk of alcohol related problems. Score 20 or above:  warrants further diagnostic evaluation for alcohol dependence and treatment.   CLINICAL FACTORS:   Unstable or Poor Therapeutic Relationship Previous Psychiatric Diagnoses and Treatments   Musculoskeletal: Strength & Muscle Tone: within normal limits Gait & Station: normal Patient leans: N/A  Psychiatric Specialty Exam:  Presentation  General Appearance:  Casual  Eye Contact: Fleeting  Speech: Clear and Coherent  Speech Volume: Normal  Handedness: Right   Mood and Affect  Mood: Euthymic  Affect: Blunt   Thought Process  Thought Processes: Linear  Descriptions of Associations:Intact  Orientation:Full (Time, Place and Person)  Thought Content:WDL  History of Schizophrenia/Schizoaffective disorder:Yes  Duration of Psychotic Symptoms:Greater than six months  Hallucinations:Hallucinations: Auditory  Ideas of Reference:None  Suicidal Thoughts:Suicidal Thoughts: No  Homicidal Thoughts:Homicidal Thoughts: No   Sensorium  Memory: Immediate Poor  Judgment: Fair  Insight: Fair   Art therapist  Concentration: Poor  Attention Span: Good  Recall: Good  Fund of Knowledge: Good  Language: Good   Psychomotor Activity  Psychomotor Activity: Psychomotor Activity: Normal   Assets  Assets: Housing; Social Support; Desire for Improvement; Communication Skills   Sleep  Sleep: Sleep: Poor    Physical Exam: Physical Exam ROS Blood pressure 119/82, pulse 64, temperature 98.1 F (36.7 C), resp. rate 18, height 5' 11 (1.803 m), weight 88.5 kg, SpO2 100%. Body mass index is 27.2 kg/m.   COGNITIVE FEATURES THAT CONTRIBUTE TO RISK:  Thought constriction (tunnel vision)    SUICIDE RISK:   Mild:  Suicidal ideation of limited  frequency, intensity, duration, and specificity.  There are no identifiable plans, no associated intent, mild dysphoria and related symptoms, good self-control (both objective and subjective assessment), few other risk factors, and identifiable protective factors, including available and accessible social support.  PLAN OF CARE:   Safety and Monitoring:   --  Voluntary admission to inpatient psychiatric unit for safety, stabilization and treatment -- Daily contact with patient to assess and evaluate symptoms and progress in treatment -- Patient's case to be discussed in multi-disciplinary team meeting -- Observation Level : q15 minute checks -- Vital signs:  q12 hours -- Precautions: suicide   I certify that inpatient services furnished can reasonably be expected to improve the patient's condition.   Donnice FORBES Right, PA-C 09/13/2023, 3:49 PM

## 2023-09-13 NOTE — [facility] Counselor (Signed)
 Adult Comprehensive Assessment  Patient ID: Ethan Sutton, male   DOB: 11-29-1985, 38 y.o.   MRN: [id number]  Information Source: Information source: Patient  Current Stressors:  Patient states their primary concerns and needs for treatment are:: My medicine wasn't working. Patient states their goals for this hospitilization and ongoing recovery are:: To get my medications right. Educational / Learning stressors: Patient denied. Employment / Job issues: Patient denied. Family Relationships: Patient denied. Financial / Lack of resources (include bankruptcy): Patient denied. Housing / Lack of housing: Patient denied. Physical health (include injuries & life threatening diseases): Patient denied. Social relationships: Patient denied. Substance abuse: Patient denied. Bereavement / Loss: Patient denied.  Living/Environment/Situation:  Living Arrangements: Parent, Other relatives Living conditions (as described by patient or guardian): WNL Who else lives in the home?: Patient reported that he lives with his mother, father and brother. How long has patient lived in current situation?: My whole life. What is atmosphere in current home: Comfortable  Family History:  Marital status: Single Are you sexually active?: Yes What is your sexual orientation?: Heterosexual. Has your sexual activity been affected by drugs, alcohol, medication, or emotional stress?: Patient denied. Does patient have children?: Yes How many children?: 3 How is patient's relationship with their children?: Ok.  Childhood History:  By whom was/is the patient raised?: Both parents Description of patient's relationship with caregiver when they were a child: It was ok. Patient's description of current relationship with people who raised him/her: Same basically. How were you disciplined when you got in trouble as a child/adolescent?: Whoppedl. Does patient have siblings?: Yes Number of Siblings:  3 Description of patient's current relationship with siblings: Good. Did patient suffer any verbal/emotional/physical/sexual abuse as a child?: No Did patient suffer from severe childhood neglect?: No Has patient ever been sexually abused/assaulted/raped as an adolescent or adult?: No Was the patient ever a victim of a crime or a disaster?: No Witnessed domestic violence?: No Has patient been affected by domestic violence as an adult?: No  Education:  Highest grade of school patient has completed: 11th. Currently a student?: No Learning disability?: No  Employment/Work Situation:   Employment Situation: Unemployed Patient's Job has Been Impacted by Current Illness: No What is the Longest Time Patient has Held a Job?: 8 years Where was the Patient Employed at that Time?: [employer]. Has Patient ever Been in the [employer]?: No  Financial Resources:   Financial resources: Support from parents / caregiver Does patient have a Lawyer or guardian?: No  Alcohol/Substance Abuse:   What has been your use of drugs/alcohol within the last 12 months?: Patient denied. If attempted suicide, did drugs/alcohol play a role in this?: No Alcohol/Substance Abuse Treatment Hx: Denies past history Has alcohol/substance abuse ever caused legal problems?: No  Social Support System:   Patient's Community Support System: Good Describe Community Support System: My parents and siblings. Type of faith/religion: Not really. How does patient's faith help to cope with current illness?: Patient denied.  Leisure/Recreation:   Do You Have Hobbies?: No  Strengths/Needs:   What is the patient's perception of their strengths?: Patient denied. Patient states they can use these personal strengths during their treatment to contribute to their recovery: Patient denied. Patient states these barriers may affect/interfere with their treatment: None reported. Patient states these barriers may affect  their return to the community: None reported. Other important information patient would like considered in planning for their treatment: Patient would like a therapy and psychiatry referral.  Discharge Plan:  Currently receiving community mental health services: No Patient states concerns and preferences for aftercare planning are: Patient would like a therapy and psychiatry referral. Patient states they will know when they are safe and ready for discharge when: When my medications are good. Does patient have access to transportation?: No Patient description of barriers related to discharge medications: None reported. Plan for no access to transportation at discharge: CSW to arrange transportation on patient's behalf at discharge id needed. Will patient be returning to same living situation after discharge?: Yes  Summary/Recommendations:   Summary and Recommendations (to be completed by the evaluator): Patient is a 38 year old male from [place], [place] [facility]) who presented to the ER requesting that his medications be adjusted according to notes. During assessment with this Clinical research associate, patient reported My medicine wasn't working. Patient denied all stressors and reported that his primary goal is To get my medications right. Patient currently resides with his mother, father and brother and described the atmosphere as "comfortable." Patient is currently unemployed and reported receiving support from his parents. Patient denied substance use and reported receiving adequate support from My parents and siblings. Patient is not currently followed by a therapist or psychiatrist but would like a referral prior to discharge. Patient denied SI, HI an AVH. Patient's current diagnosis is MDD (major depressive disorder), recurrent severe, without psychosis (HCC). Recommendations include crisis stabilization, therapeutic milieu, encourage group attendance and participation, medication management for mood  stabilization, and development of a comprehensive mental wellness/sobriety plan.  Sharmel Ballantine M Helyn Schwan. 09/13/2023

## 2023-09-13 NOTE — Progress Notes (Signed)
 Patient denies SI, HI and AVH this shift. Patient has had no incidents of behavioral dyscontrol. Patient has been compliant with medications, attended groups and engaged in 1:1 therapeutic staff talks.  Assess patient for safety, offer medications as planned, engage patient in 1:1 staff talks.  Continue to monitor as planned. Patient able to contract for safety.

## 2023-09-13 NOTE — Group Note (Signed)
 LCSW Group Therapy Note  Group Date: 09/13/2023 Start Time: 1300 End Time: 1400   Type of Therapy and Topic:  Group Therapy - Healthy vs Unhealthy Coping Skills  Participation Level:  Did Not Attend   Description of Group The focus of this group was to determine what unhealthy coping techniques typically are used by group members and what healthy coping techniques would be helpful in coping with various problems. Patients were guided in becoming aware of the differences between healthy and unhealthy coping techniques. Patients were asked to identify 2-3 healthy coping skills they would like to learn to use more effectively.  Therapeutic Goals Patients learned that coping is what human beings do all day long to deal with various situations in their lives Patients defined and discussed healthy vs unhealthy coping techniques Patients identified their preferred coping techniques and identified whether these were healthy or unhealthy Patients determined 2-3 healthy coping skills they would like to become more familiar with and use more often. Patients provided support and ideas to each other   Summary of Patient Progress:  Patient did not attend.    Therapeutic Modalities Cognitive Behavioral Therapy Motivational Interviewing  Kierstin January M Lulla Linville, LCSWA 09/13/2023  2:55 PM

## 2023-09-13 NOTE — Plan of Care (Signed)
   Problem: Education: Goal: Emotional status will improve Outcome: Progressing Goal: Mental status will improve Outcome: Progressing

## 2023-09-13 NOTE — Group Note (Signed)
 Date:  09/13/2023 Time:  8:57 PM  Group Topic/Focus:  Managing Feelings:   The focus of this group is to identify what feelings patients have difficulty handling and develop a plan to handle them in a healthier way upon discharge.    Participation Level:  Active  Participation Quality:  Appropriate and Attentive  Affect:  Appropriate  Cognitive:  Alert and Appropriate  Insight: Appropriate  Engagement in Group:  Engaged  Modes of Intervention:  Clarification, Discussion, Education, Rapport Building, and Support  Additional Comments:     Ethan Sutton 09/13/2023, 8:57 PM

## 2023-09-13 NOTE — [facility] Suicide Risk Assessment (Signed)
 [facility] INPATIENT:  Family/Significant Other Suicide Prevention Education  Suicide Prevention Education:  Patient Refusal for Family/Significant Other Suicide Prevention Education: The patient Ethan Sutton has refused to provide written consent for family/significant other to be provided Family/Significant Other Suicide Prevention Education during admission and/or prior to discharge.  Physician notified.  SPE completed with pt, as pt refused to consent to family contact. SPI pamphlet provided to pt and pt was encouraged to share information with support network, ask questions, and talk about any concerns relating to SPE. Pt denies access to guns/firearms and verbalized understanding of information provided. Mobile Crisis information also provided to pt.    Marvelyn Bouchillon M Neeka Urista 09/13/2023, 11:12 AM

## 2023-09-13 NOTE — H&P (Addendum)
 Psychiatric Admission Assessment Adult  Patient Identification: Ethan Sutton MRN:  [id number] Date of Evaluation:  09/13/2023 Chief Complaint:  MDD (major depressive disorder), recurrent severe, without psychosis (HCC) [F33.2]   History of Present Illness:  Patient is a 38 year old African American male with a past psychiatric history schizoaffective disorder and amphetamine abuse who is admitted following presentation to the emergency department requesting medication adjustment. He reports that his current regimen is no longer effective, stating, "My medicine wasn't working." He endorses vague suicidal thoughts without a specific plan, as well as auditory hallucinations. Reports the hallucinations have been on going, however the SI started within the last week and he is unable to identify a stressor.  He denies anhedonia, hopelessness, helplessness, guilt, worthlessness, and fatigue.  He does note poor sleep he denies sleep pattern change.  He denies grandiose thoughts or impulsivity.  He is linear on exam today.  He denies current SI.  He has no outpatient provider due to the lack of transportation.  He has a smart phone but does not have service he does have access to Wi-Fi.  Contact mother [phone] ) for collateral.  She indicates that his medications have not been effective recently and he has been having a tough time over the past 2 weeks.  She denies any current safety concerns.  She indicates that he does not have an outpatient psychiatric provider and that he recently got his meds from the ED.  Reports patient could do video visits from home due to Wi-Fi connection but if he is away from home he would not be able to. She reports increased pacing, poor sleep, responding to internal stimuli, and disorganized speech. She is concerned about escalating behaviors and states that if they continue, he may no longer be able to live at home but states that at present he can return home at  discharge.The family requests home-based services similar to what another sibling receives.  He denies homicidal ideations today.  Reported auditory hallucinations but was not internally preoccupied during the interview.  He gave very short answers and did not elaborate during questioning.    Total Time spent with patient: 1 hour Sleep  Sleep:No data recorded Past Psychiatric History:   Psychiatric History:  Information collected from pt and chart review  Prev Dx/Sx: bipolar disorder, schizophrenia, schizoaffective, anxiety, depression Current Psych Provider: none Home Meds (current): zyprexa   Previous Med Trials: risperidone , pt uncertain  Therapy: none  Prior Psych Hospitalization: Multiple most recently January at [facility] Prior Self Harm: Denies Prior Violence: Denies  Family Psych History: Siblings with schizophrenia and aggression Family Hx suicide: None reported  Social History:  Developmental Hx:  Educational Hx: 11 th 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 Yes.    Has the patient been a risk to self in the past 6 months? No.  Has the patient been a risk to self within the distant past? No.  Is the patient a risk to others? No.  Has the patient been a risk to others in the past 6 months? No.  Has the patient been a risk to others within the distant past? No.   [place] Scale:  Flowsheet Row Admission (Current) from 09/12/2023 in [facility]INE Most recent reading at 09/12/2023  3:03  PM ED from 09/12/2023 in [facility] Emergency Department at [facility] Most recent reading at 09/12/2023 11:07 AM ED from 09/07/2023 in [facility] Emergency Department at [facility] Most  recent reading at 09/07/2023 11:09 AM  C-SSRS RISK CATEGORY No Risk No Risk No Risk     Past Medical History:  Past Medical History:  Diagnosis Date   Depression 02/09/2023   Schizophrenia [facility])     Past Surgical History:  Procedure Laterality Date   NO PAST SURGERIES     Family History: History reviewed. No pertinent family history.  Social History:  Social History   Substance and Sexual Activity  Alcohol Use Not Currently   Comment: ive been in prison for a year i haven't used any     Social History   Substance and Sexual Activity  Drug Use Not Currently      Allergies:  No Known Allergies Lab Results:  Results for orders placed or performed during the hospital encounter of 09/12/23 (from the past 48 hours)  Urine Drug Screen, Qualitative     Status: None   Collection Time: 09/12/23 10:13 AM  Result Value Ref Range   Tricyclic, Ur Screen NONE DETECTED NONE DETECTED   Amphetamines, Ur Screen NONE DETECTED NONE DETECTED   MDMA (Ecstasy)Ur Screen NONE DETECTED NONE DETECTED   Cocaine Metabolite,Ur [place] NONE DETECTED NONE DETECTED   Opiate, Ur Screen NONE DETECTED NONE DETECTED   Phencyclidine (PCP) Ur S NONE DETECTED NONE DETECTED   Cannabinoid 50 Ng, Ur [place] NONE DETECTED NONE DETECTED   Barbiturates, Ur Screen NONE DETECTED NONE DETECTED   Benzodiazepine, Ur Scrn NONE DETECTED NONE DETECTED   Methadone Scn, Ur NONE DETECTED NONE DETECTED    Comment: (NOTE) Tricyclics + metabolites, urine    Cutoff 1000 ng/mL Amphetamines + metabolites, urine  Cutoff 1000 ng/mL MDMA (Ecstasy), urine              Cutoff 500 ng/mL Cocaine Metabolite, urine          Cutoff 300 ng/mL Opiate + metabolites, urine        Cutoff 300 ng/mL Phencyclidine (PCP), urine         Cutoff 25 ng/mL Cannabinoid, urine                 Cutoff 50 ng/mL Barbiturates + metabolites, urine  Cutoff 200 ng/mL Benzodiazepine, urine              Cutoff 200 ng/mL Methadone, urine                   Cutoff 300  ng/mL  The urine drug screen provides only a preliminary, unconfirmed analytical test result and should not be used for non-medical purposes. Clinical consideration and professional judgment should be applied to any positive drug screen result due to possible interfering substances. A more specific alternate chemical method must be used in order to obtain a confirmed analytical result. Gas chromatography / mass spectrometry (GC/MS) is the preferred confirm atory method. Performed at [facility], [address] [place], [place] [address]   Comprehensive metabolic panel     Status: Abnormal   Collection Time: 09/12/23 10:42 AM  Result Value Ref Range   Sodium 140 135 - 145 mmol/L   Potassium 3.4 (L) 3.5 - 5.1 mmol/L   Chloride 101 98 - 111 mmol/L   CO2 28 22 - 32 mmol/L   Glucose, Bld 94 70 - 99 mg/dL    Comment: Glucose reference  range applies only to samples taken after fasting for at least 8 hours.   BUN 6 6 - 20 mg/dL   Creatinine, Ser 9.08 0.61 - 1.24 mg/dL   Calcium 9.3 8.9 - 89.6 mg/dL   Total Protein 8.5 (H) 6.5 - 8.1 g/dL   Albumin 4.5 3.5 - 5.0 g/dL   AST 23 15 - 41 U/L   ALT 23 0 - 44 U/L   Alkaline Phosphatase 86 38 - 126 U/L   Total Bilirubin 0.9 0.0 - 1.2 mg/dL   GFR, Estimated >39 >39 mL/min    Comment: (NOTE) Calculated using the CKD-EPI Creatinine Equation (2021)    Anion gap 11 5 - 15    Comment: Performed at [facility], [address] [place], [place] [address]  Ethanol     Status: None   Collection Time: 09/12/23 10:42 AM  Result Value Ref Range   Alcohol, Ethyl (B) <15 <15 mg/dL    Comment: (NOTE) For medical purposes only. Performed at [facility], [address] [place], [place] [address]   CBC with Diff     Status: Abnormal   Collection Time: 09/12/23 10:42 AM  Result Value Ref Range   WBC 11.8 (H) 4.0 - 10.5 K/uL   RBC 5.55 4.22 - 5.81 MIL/uL   Hemoglobin 17.0 13.0 - 17.0 g/dL   HCT 50.2 60.9 - 47.9 %    MCV 89.5 80.0 - 100.0 fL   MCH 30.6 26.0 - 34.0 pg   MCHC 34.2 30.0 - 36.0 g/dL   RDW 87.2 88.4 - 84.4 %   Platelets 282 150 - 400 K/uL   nRBC 0.0 0.0 - 0.2 %   Neutrophils Relative % 76 %   Neutro Abs 8.9 (H) 1.7 - 7.7 K/uL   Lymphocytes Relative 16 %   Lymphs Abs 1.8 0.7 - 4.0 K/uL   Monocytes Relative 5 %   Monocytes Absolute 0.6 0.1 - 1.0 K/uL   Eosinophils Relative 2 %   Eosinophils Absolute 0.3 0.0 - 0.5 K/uL   Basophils Relative 0 %   Basophils Absolute 0.1 0.0 - 0.1 K/uL   Immature Granulocytes 1 %   Abs Immature Granulocytes 0.07 0.00 - 0.07 K/uL    Comment: Performed at [facility], [address] [place], [place] [address]  Salicylate level     Status: Abnormal   Collection Time: 09/12/23 10:42 AM  Result Value Ref Range   Salicylate Lvl <7.0 (L) 7.0 - 30.0 mg/dL    Comment: Performed at [facility], [address] [place], [place] [address]  Acetaminophen  level     Status: Abnormal   Collection Time: 09/12/23 10:42 AM  Result Value Ref Range   Acetaminophen  (Tylenol ), Serum <10 (L) 10 - 30 ug/mL    Comment: (NOTE) Therapeutic concentrations vary significantly. A range of 10-30 ug/mL  may be an effective concentration for many patients. However, some  are best treated at concentrations outside of this range. Acetaminophen  concentrations >150 ug/mL at 4 hours after ingestion  and >50 ug/mL at 12 hours after ingestion are often associated with  toxic reactions.  Performed at [facility], [address], [place], [place] [address]     Blood Alcohol level:  Lab Results  Component Value Date   [facility] <15 09/12/2023   ETH <10 02/09/2023    Metabolic Disorder Labs:  No results found for: HGBA1C, MPG No results found for: PROLACTIN No results found for: CHOL, TRIG, HDL, CHOLHDL, VLDL,  LDLCALC  Current Medications: Current Facility-Administered Medications  Medication Dose Route Frequency Provider Last Rate  Last Admin   acetaminophen  (TYLENOL ) tablet 650 mg  650 mg Oral Q6H PRN Donnelly Mellow, MD       alum & mag hydroxide-simeth (MAALOX/MYLANTA) 200-200-20 MG/5ML suspension 30 mL  30 mL Oral Q4H PRN Jadapalle, Sree, MD       haloperidol  (HALDOL ) tablet 5 mg  5 mg Oral TID PRN Jadapalle, Sree, MD       And   diphenhydrAMINE  (BENADRYL ) capsule 50 mg  50 mg Oral TID PRN Jadapalle, Sree, MD       haloperidol  lactate (HALDOL ) injection 5 mg  5 mg Intramuscular TID PRN Jadapalle, Sree, MD       And   diphenhydrAMINE  (BENADRYL ) injection 50 mg  50 mg Intramuscular TID PRN Jadapalle, Sree, MD       And   LORazepam  (ATIVAN ) injection 2 mg  2 mg Intramuscular TID PRN Jadapalle, Sree, MD       haloperidol  lactate (HALDOL ) injection 10 mg  10 mg Intramuscular TID PRN Donnelly Mellow, MD       And   diphenhydrAMINE  (BENADRYL ) injection 50 mg  50 mg Intramuscular TID PRN Jadapalle, Sree, MD       And   LORazepam  (ATIVAN ) injection 2 mg  2 mg Intramuscular TID PRN Jadapalle, Sree, MD       FLUoxetine  (PROZAC ) capsule 10 mg  10 mg Oral Daily Orestes Geiman E, PA-C       hydrOXYzine  (ATARAX ) tablet 25 mg  25 mg Oral Q6H PRN Jadapalle, Sree, MD   25 mg at 09/12/23 2106   magnesium  hydroxide (MILK OF MAGNESIA) suspension 30 mL  30 mL Oral Daily PRN Jadapalle, Sree, MD       OLANZapine  (ZYPREXA ) tablet 10 mg  10 mg Oral BID Jadapalle, Sree, MD   10 mg at 09/13/23 0945   traZODone  (DESYREL ) tablet 50 mg  50 mg Oral QHS PRN Jadapalle, Sree, MD   50 mg at 09/12/23 2106   PTA Medications: Medications Prior to Admission  Medication Sig Dispense Refill Last Dose/Taking   ibuprofen  (ADVIL ) 200 MG tablet Take 200 mg by mouth as needed for headache or mild pain (pain score 1-3).      melatonin 5 MG TABS Take 1 tablet (5 mg total) by mouth at bedtime.      OLANZapine  (ZYPREXA ) 15 MG tablet Take 1 tablet (15 mg total) by mouth at bedtime. 30 tablet 0    traZODone  (DESYREL ) 50 MG tablet Take 1 tablet (50 mg total)  by mouth at bedtime as needed for sleep. 30 tablet 0     Psychiatric Specialty Exam:  Presentation  General Appearance:  Appropriate for Environment; Casual; Fairly Groomed  Eye Contact: Good  Speech: Normal Rate; Clear and Coherent  Speech Volume: Normal    Mood and Affect  Mood: Euthymic  Affect: Appropriate; Congruent; Full Range   Thought Process  Thought Processes: Linear  Descriptions of Associations:Intact  Orientation:Full (Time, Place and Person)  Thought Content:Logical  Hallucinations:No data recorded Ideas of Reference:None  Suicidal Thoughts:No data recorded Homicidal Thoughts:No data recorded  Sensorium  Memory: Immediate Good; Recent Good; Remote Good  Judgment: Good  Insight: Good   Executive Functions  Concentration: Good  Attention Span: Good  Recall: Good  Fund of Knowledge: Good  Language: Good   Psychomotor Activity  Psychomotor Activity:No data recorded  Assets  Assets: Desire for Improvement; Housing; Social Support  Musculoskeletal: Strength & Muscle Tone: within normal limits Gait & Station: normal  Physical Exam: Physical Exam Vitals and nursing note reviewed.  HENT:     Head: Atraumatic.  Eyes:     Extraocular Movements: Extraocular movements intact.  Pulmonary:     Effort: Pulmonary effort is normal.  Neurological:     Mental Status: He is alert and oriented to person, place, and time.    Review of Systems  Psychiatric/Behavioral:  Positive for hallucinations. Negative for depression, substance abuse and suicidal ideas. The patient is nervous/anxious and has insomnia.    Blood pressure 119/82, pulse 64, temperature 98.1 F (36.7 C), resp. rate 18, height 5' 11 (1.803 m), weight 88.5 kg, SpO2 100%. Body mass index is 27.2 kg/m.  Principal Diagnosis: Schizoaffective disorder (HCC) Diagnosis:  Principal Problem:   Schizoaffective disorder (HCC) Active Problems:   MDD (major  depressive disorder), recurrent severe, without psychosis (HCC)   Clinical Decision Making:  38 year old male with a history schizoaffective disorder and amphetamine abuse presenting with suicidal ideation, auditory hallucinations, medication noncompliance, and functional decline. Symptoms are complicated by limited outpatient engagement, poor transportation resources, and reliance on family for support. Collateral reports significant behavioral dysregulation and psychotic symptoms, consistent with decompensation in the context of suboptimal or absent pharmacotherapy.   Risk is elevated due to vague suicidal thoughts, recent hallucinations, impaired judgment, poor adherence, and inability to reliably follow outpatient treatment. Protective factors include supportive family, stable housing, and willingness to seek care. The patient would benefit from inpatient psychiatric admission for stabilization, medication adjustment, and development of a more reliable follow-up plan. Given barriers to outpatient care, referral to Assertive Community Treatment (ACT) or home-based psychiatric services is strongly recommended. He has signed the 72 hour form at this juncture we will continue to monitor.   Treatment Plan Summary:  Safety and Monitoring:             -- Voluntary admission to inpatient psychiatric unit for safety, stabilization and treatment             -- Daily contact with patient to assess and evaluate symptoms and progress in treatment             -- Patient's case to be discussed in multi-disciplinary team meeting             -- Observation Level: q15 minute checks             -- Vital signs:  q12 hours             -- Precautions: suicide, elopement, and assault   2. Psychiatric Diagnoses and Treatment:         MDD/schizoaffective disorder       Continue zyprexa  10 mg bid Start fluoxetine  10 mg daily   -- The risks/benefits/side-effects/alternatives to this medication were discussed in  detail with the patient and time was given for questions. The patient consents to medication trial.                -- Metabolic profile and EKG monitoring obtained while on an atypical antipsychotic (BMI: Lipid Panel: HbgA1c: QTc:)              -- Encouraged patient to participate in unit milieu and in scheduled group therapies                            3. Medical Issues Being Addressed:     no acute  concerns 4. Discharge Planning:              -- Social work and case management to assist with discharge planning and identification of hospital follow-up needs prior to discharge             -- Estimated LOS: 5-7 days             -- Discharge Concerns: Need to establish a safety plan; Medication compliance and effectiveness             -- Discharge Goals: Return home with outpatient referrals follow ups  Physician Treatment Plan for Primary Diagnosis: Schizoaffective disorder (HCC) Long Term Goal(s): Improvement in symptoms so as ready for discharge  Short Term Goals: Ability to maintain clinical measurements within normal limits will improve and Compliance with prescribed medications will improve  Physician Treatment Plan for Secondary Diagnosis: Principal Problem:   Schizoaffective disorder (HCC) Active Problems:   MDD (major depressive disorder), recurrent severe, without psychosis (HCC)   I certify that inpatient services furnished can reasonably be expected to improve the patient's condition.    Donnice FORBES Right, PA-C 8/16/20253:47 PM

## 2023-09-14 NOTE — BH IP Treatment Plan (Signed)
 Interdisciplinary Treatment and Diagnostic Plan Update  09/14/2023 Time of Session: 12:11PM Ethan Sutton MRN: [id number]  Principal Diagnosis: Schizoaffective disorder [facility])  Secondary Diagnoses: Principal Problem:   Schizoaffective disorder (HCC) Active Problems:   MDD (major depressive disorder), recurrent severe, without psychosis (HCC)   Current Medications:  Current Facility-Administered Medications  Medication Dose Route Frequency Provider Last Rate Last Admin   acetaminophen  (TYLENOL ) tablet 650 mg  650 mg Oral Q6H PRN Jadapalle, Sree, MD       alum & mag hydroxide-simeth (MAALOX/MYLANTA) 200-200-20 MG/5ML suspension 30 mL  30 mL Oral Q4H PRN Jadapalle, Sree, MD       haloperidol  (HALDOL ) tablet 5 mg  5 mg Oral TID PRN Jadapalle, Sree, MD       And   diphenhydrAMINE  (BENADRYL ) capsule 50 mg  50 mg Oral TID PRN Jadapalle, Sree, MD       haloperidol  lactate (HALDOL ) injection 5 mg  5 mg Intramuscular TID PRN Jadapalle, Sree, MD       And   diphenhydrAMINE  (BENADRYL ) injection 50 mg  50 mg Intramuscular TID PRN Jadapalle, Sree, MD       And   LORazepam  (ATIVAN ) injection 2 mg  2 mg Intramuscular TID PRN Jadapalle, Sree, MD       haloperidol  lactate (HALDOL ) injection 10 mg  10 mg Intramuscular TID PRN Jadapalle, Sree, MD       And   diphenhydrAMINE  (BENADRYL ) injection 50 mg  50 mg Intramuscular TID PRN Jadapalle, Sree, MD       And   LORazepam  (ATIVAN ) injection 2 mg  2 mg Intramuscular TID PRN Jadapalle, Sree, MD       FLUoxetine  (PROZAC ) capsule 10 mg  10 mg Oral Daily Millington, Matthew E, PA-C   10 mg at 09/14/23 9090   hydrOXYzine  (ATARAX ) tablet 25 mg  25 mg Oral Q6H PRN Jadapalle, Sree, MD   25 mg at 09/12/23 2106   magnesium  hydroxide (MILK OF MAGNESIA) suspension 30 mL  30 mL Oral Daily PRN Donnelly Mellow, MD       OLANZapine  (ZYPREXA ) tablet 10 mg  10 mg Oral BID Jadapalle, Sree, MD   10 mg at 09/14/23 9090   traZODone  (DESYREL ) tablet 50 mg  50 mg Oral QHS PRN  Jadapalle, Sree, MD   50 mg at 09/13/23 2114   PTA Medications: Medications Prior to Admission  Medication Sig Dispense Refill Last Dose/Taking   ibuprofen  (ADVIL ) 200 MG tablet Take 200 mg by mouth as needed for headache or mild pain (pain score 1-3).      melatonin 5 MG TABS Take 1 tablet (5 mg total) by mouth at bedtime.      OLANZapine  (ZYPREXA ) 15 MG tablet Take 1 tablet (15 mg total) by mouth at bedtime. 30 tablet 0    traZODone  (DESYREL ) 50 MG tablet Take 1 tablet (50 mg total) by mouth at bedtime as needed for sleep. 30 tablet 0     Patient Stressors: Other: none identified; pt did not disclose    Patient Strengths: Supportive family/friends   Treatment Modalities: Medication Management, Group therapy, Case management,  1 to 1 session with clinician, Psychoeducation, Recreational therapy.   Physician Treatment Plan for Primary Diagnosis: Schizoaffective disorder (HCC) Long Term Goal(s): Improvement in symptoms so as ready for discharge   Short Term Goals: Ability to maintain clinical measurements within normal limits will improve Compliance with prescribed medications will improve  Medication Management: Evaluate patient's response, side effects, and tolerance of medication regimen.  Therapeutic Interventions: 1 to 1 sessions, Unit Group sessions and Medication administration.  Evaluation of Outcomes: Progressing  Physician Treatment Plan for Secondary Diagnosis: Principal Problem:   Schizoaffective disorder (HCC) Active Problems:   MDD (major depressive disorder), recurrent severe, without psychosis (HCC)  Long Term Goal(s): Improvement in symptoms so as ready for discharge   Short Term Goals: Ability to maintain clinical measurements within normal limits will improve Compliance with prescribed medications will improve     Medication Management: Evaluate patient's response, side effects, and tolerance of medication regimen.  Therapeutic Interventions: 1 to 1  sessions, Unit Group sessions and Medication administration.  Evaluation of Outcomes: Progressing   RN Treatment Plan for Primary Diagnosis: Schizoaffective disorder (HCC) Long Term Goal(s): Knowledge of disease and therapeutic regimen to maintain health will improve  Short Term Goals: Ability to demonstrate self-control, Ability to participate in decision making will improve, Ability to verbalize feelings will improve, Ability to disclose and discuss suicidal ideas, Ability to identify and develop effective coping behaviors will improve, and Compliance with prescribed medications will improve  Medication Management: RN will administer medications as ordered by provider, will assess and evaluate patient's response and provide education to patient for prescribed medication. RN will report any adverse and/or side effects to prescribing provider.  Therapeutic Interventions: 1 on 1 counseling sessions, Psychoeducation, Medication administration, Evaluate responses to treatment, Monitor vital signs and CBGs as ordered, Perform/monitor CIWA, COWS, AIMS and Fall Risk screenings as ordered, Perform wound care treatments as ordered.  Evaluation of Outcomes: Progressing   LCSW Treatment Plan for Primary Diagnosis: Schizoaffective disorder (HCC) Long Term Goal(s): Safe transition to appropriate next level of care at discharge, Engage patient in therapeutic group addressing interpersonal concerns.  Short Term Goals: Engage patient in aftercare planning with referrals and resources, Increase social support, Increase ability to appropriately verbalize feelings, Increase emotional regulation, Facilitate acceptance of mental health diagnosis and concerns, and Increase skills for wellness and recovery  Therapeutic Interventions: Assess for all discharge needs, 1 to 1 time with Social worker, Explore available resources and support systems, Assess for adequacy in community support network, Educate family and  significant other(s) on suicide prevention, Complete Psychosocial Assessment, Interpersonal group therapy.  Evaluation of Outcomes: Progressing   Progress in Treatment: Attending groups: Yes. Participating in groups: Yes. Taking medication as prescribed: Yes. Toleration medication: Yes. Family/Significant other contact made: No, will contact:  once permission hs been granted Patient understands diagnosis: Yes. Discussing patient identified problems/goals with staff: Yes. Medical problems stabilized or resolved: Yes. Denies suicidal/homicidal ideation: Yes. Issues/concerns per patient self-inventory: No. Other: none  New problem(s) identified: No, Describe:  none  New Short Term/Long Term Goal(s): detox, elimination of symptoms of psychosis, medication management for mood stabilization; elimination of SI thoughts; development of comprehensive mental wellness/sobriety plan.   Patient Goals:  just my meds so I can sleep  Discharge Plan or Barriers:  CSW to assist in the development of appropriate discharge plans.    Reason for Continuation of Hospitalization: Anxiety Depression Medication stabilization Suicidal ideation  Estimated Length of Stay:  1-7 days  Last 3 [place] Suicide Severity Risk Score: Flowsheet Row Admission (Current) from 09/12/2023 in [facility] INPATIENT BEHAVIORAL MEDICINE Most recent reading at 09/12/2023  3:03 PM ED from 09/12/2023 in [facility] Emergency Department at [facility] Most recent reading at 09/12/2023 11:07 AM ED from 09/07/2023 in [facility] Emergency Department at [facility] Most recent reading at 09/07/2023 11:09 AM  C-SSRS RISK CATEGORY No Risk No Risk No  Risk    Last PHQ 2/9 Scores:    01/10/2023    2:37 PM  Depression screen PHQ 2/9  Decreased Interest 2  Down, Depressed, Hopeless 2  PHQ - 2 Score 4  Altered sleeping 2  Tired, decreased energy 1  Change in appetite 2  Feeling bad or failure about yourself  2  Trouble  concentrating 1  Moving slowly or fidgety/restless 0  Suicidal thoughts 2  PHQ-9 Score 14  Difficult doing work/chores Somewhat difficult    Scribe for Treatment Team: Sherryle JINNY Margo, KEN 09/14/2023 1:31 PM

## 2023-09-14 NOTE — Progress Notes (Signed)
 Patient denies SI, HI and AVH this shift. Patient has had no incidents of behavioral dyscontrol. Patient has been compliant with medications, attended groups and engaged in 1:1 therapeutic staff talks.  Assess patient for safety, offer medications as planned, engage patient in 1:1 staff talks.  Continue to monitor as planned. Patient able to contract for safety.

## 2023-09-14 NOTE — Group Note (Signed)
 Date:  09/14/2023 Time:  9:00 PM  Group Topic/Focus:  Wrap-Up Group:   The focus of this group is to help patients review their daily goal of treatment and discuss progress on daily workbooks.    Participation Level:  Active  Participation Quality:  Appropriate and Attentive  Affect:  Appropriate  Cognitive:  Appropriate  Insight: Appropriate  Engagement in Group:  Limited  Modes of Intervention:  Support  Additional Comments:     Kerri Katz 09/14/2023, 9:00 PM

## 2023-09-14 NOTE — Progress Notes (Signed)
 [facility] MD Progress Note  09/14/2023 1:05 PM Ethan Sutton  MRN:  [id number]   Subjective:  Chart reviewed, case discussed in multidisciplinary meeting, patient seen during rounds.   Is doing well today sleep was improved. Notes concerns on arrival were SI, hallucinations, and sleep. Since restarting on medications he notes resolution of SI and hallucinations. He has slept well since restarting meds. He is linear, logical, and future oriented. States he is unwilling to rescind his 72 hour form and wants to be discharged. Tomorrow. Demonstrates good insight into the need for outpatient follow up and continued medication compliance. Denies SI/HI/AVH. Is adamant that he wants to be discharged tomorrow. Depression 3/10 and anxiety 0/10.  Sleep: Good  Appetite:  Good  Past Psychiatric History: see h&P Family History: History reviewed. No pertinent family history. Social History:  Social History   Substance and Sexual Activity  Alcohol Use Not Currently   Comment: ive been in prison for a year i haven't used any     Social History   Substance and Sexual Activity  Drug Use Not Currently    Social History   Socioeconomic History   Marital status: Single    Spouse name: Not on file   Number of children: Not on file   Years of education: Not on file   Highest education level: Not on file  Occupational History   Not on file  Tobacco Use   Smoking status: Some Days    Current packs/day: 0.00    Types: Cigarettes    Last attempt to quit: 2019    Years since quitting: 6.6   Smokeless tobacco: Never   Tobacco comments:    Pt declined nicotine  replacement 09/12/23  Vaping Use   Vaping status: Never Used  Substance and Sexual Activity   Alcohol use: Not Currently    Comment: ive been in prison for a year i haven't used any   Drug use: Not Currently   Sexual activity: Not Currently    Birth control/protection: Condom, Abstinence  Other Topics Concern   Not on file  Social History  Narrative   Not on file   Social Drivers of Health   Financial Resource Strain: Not on file  Food Insecurity: No Food Insecurity (09/12/2023)   Hunger Vital Sign    Worried About Running Out of Food in the Last Year: Never true    Ran Out of Food in the Last Year: Never true  Transportation Needs: No Transportation Needs (09/12/2023)   PRAPARE - Administrator, Civil Service (Medical): No    Lack of Transportation (Non-Medical): No  Physical Activity: Not on file  Stress: Not on file  Social Connections: Not on file   Past Medical History:  Past Medical History:  Diagnosis Date   Depression 02/09/2023   Schizophrenia [facility])     Past Surgical History:  Procedure Laterality Date   NO PAST SURGERIES      Current Medications: Current Facility-Administered Medications  Medication Dose Route Frequency Provider Last Rate Last Admin   acetaminophen  (TYLENOL ) tablet 650 mg  650 mg Oral Q6H PRN Jadapalle, Sree, MD       alum & mag hydroxide-simeth (MAALOX/MYLANTA) 200-200-20 MG/5ML suspension 30 mL  30 mL Oral Q4H PRN Jadapalle, Sree, MD       haloperidol  (HALDOL ) tablet 5 mg  5 mg Oral TID PRN Jadapalle, Sree, MD       And   diphenhydrAMINE  (BENADRYL ) capsule 50 mg  50 mg Oral TID  PRN Jadapalle, Sree, MD       haloperidol  lactate (HALDOL ) injection 5 mg  5 mg Intramuscular TID PRN Jadapalle, Sree, MD       And   diphenhydrAMINE  (BENADRYL ) injection 50 mg  50 mg Intramuscular TID PRN Donnelly Mellow, MD       And   LORazepam  (ATIVAN ) injection 2 mg  2 mg Intramuscular TID PRN Jadapalle, Sree, MD       haloperidol  lactate (HALDOL ) injection 10 mg  10 mg Intramuscular TID PRN Donnelly Mellow, MD       And   diphenhydrAMINE  (BENADRYL ) injection 50 mg  50 mg Intramuscular TID PRN Jadapalle, Sree, MD       And   LORazepam  (ATIVAN ) injection 2 mg  2 mg Intramuscular TID PRN Jadapalle, Sree, MD       FLUoxetine  (PROZAC ) capsule 10 mg  10 mg Oral Daily Zellie Jenning E, PA-C    10 mg at 09/14/23 9090   hydrOXYzine  (ATARAX ) tablet 25 mg  25 mg Oral Q6H PRN Jadapalle, Sree, MD   25 mg at 09/12/23 2106   magnesium  hydroxide (MILK OF MAGNESIA) suspension 30 mL  30 mL Oral Daily PRN Jadapalle, Sree, MD       OLANZapine  (ZYPREXA ) tablet 10 mg  10 mg Oral BID Jadapalle, Sree, MD   10 mg at 09/14/23 9090   traZODone  (DESYREL ) tablet 50 mg  50 mg Oral QHS PRN Jadapalle, Sree, MD   50 mg at 09/13/23 2114    Lab Results: No results found for this or any previous visit (from the past 48 hours).  Blood Alcohol level:  Lab Results  Component Value Date   [facility] <15 09/12/2023   ETH <10 02/09/2023    Metabolic Disorder Labs: No results found for: HGBA1C, MPG No results found for: PROLACTIN No results found for: CHOL, TRIG, HDL, CHOLHDL, VLDL, LDLCALC  Physical Findings: AIMS:  , ,  ,  ,    CIWA:    COWS:      Psychiatric Specialty Exam:  Presentation  General Appearance:  Casual  Eye Contact: Fair  Speech: Clear and Coherent  Speech Volume: Normal    Mood and Affect  Mood: Euthymic  Affect: Congruent   Thought Process  Thought Processes: Coherent  Descriptions of Associations:Intact  Orientation:Full (Time, Place and Person)  Thought Content:WDL  Hallucinations:Hallucinations: None  Ideas of Reference:None  Suicidal Thoughts:Suicidal Thoughts: No  Homicidal Thoughts:Homicidal Thoughts: No   Sensorium  Memory: Immediate Fair; Recent Fair  Judgment: Fair  Insight: Fair   Art therapist  Concentration: Fair  Attention Span: Fair  Recall: [employer] of Knowledge: Fair  Language: Fair   Psychomotor Activity  Psychomotor Activity: Psychomotor Activity: Normal  Musculoskeletal: Strength & Muscle Tone: within normal limits Gait & Station: normal Assets  Assets: Manufacturing systems engineer; Desire for Improvement; Social Support; Housing    Physical Exam: Physical Exam Vitals and nursing  note reviewed.  HENT:     Head: Atraumatic.  Eyes:     Extraocular Movements: Extraocular movements intact.  Pulmonary:     Effort: Pulmonary effort is normal.  Neurological:     Mental Status: He is alert and oriented to person, place, and time.  Psychiatric:        Mood and Affect: Mood normal.        Behavior: Behavior normal.    Review of Systems  Psychiatric/Behavioral:  Positive for depression. Negative for hallucinations, substance abuse and suicidal ideas. The patient is  not nervous/anxious and does not have insomnia.    Blood pressure (!) 105/94, pulse 75, temperature 97.9 F (36.6 C), resp. rate 18, height 5' 11 (1.803 m), weight 88.5 kg, SpO2 98%. Body mass index is 27.2 kg/m.  Diagnosis: Principal Problem:   Schizoaffective disorder (HCC) Active Problems:   MDD (major depressive disorder), recurrent severe, without psychosis (HCC)   PLAN: Safety and Monitoring:  -- Voluntary admission to inpatient psychiatric unit for safety, stabilization and treatment  -- Daily contact with patient to assess and evaluate symptoms and progress in treatment  -- Patient's case to be discussed in multi-disciplinary team meeting  -- Observation Level : q15 minute checks  -- Vital signs:  q12 hours  -- Precautions: suicide, elopement, and assault -- Encouraged patient to participate in unit milieu and in scheduled group therapies  2. Psychiatric Diagnoses and Treatment:     MDD/schizoaffective disorder       Continue zyprexa  10 mg bid Continue fluoxetine  10 mg daily   -- The risks/benefits/side-effects/alternatives to this medication were discussed in detail with the patient and time was given for questions. The patient consents to medication trial.                -- Metabolic profile and EKG monitoring obtained while on an atypical antipsychotic (BMI: Lipid Panel: HbgA1c: QTc:) qtc 406 02/13/23             -- Encouraged patient to participate in unit milieu and in scheduled group  therapies                            3. Medical Issues Being Addressed:     no acute concerns 4. Discharge Planning:   -- Social work and case management to assist with discharge planning and identification of hospital follow-up needs prior to discharge  --signed 72 hour form on Friday not meeting IVC criteria at present. Will likely be discharged tomorrow. Needs remote follow up has no transportation.   Donnice FORBES Right, PA-C 09/14/2023, 1:05 PM

## 2023-09-14 NOTE — Group Note (Signed)
 Date:  09/14/2023 Time:  10:31 AM  Group Topic/Focus:  Goals Group:   The focus of this group is to help patients establish daily goals to achieve during treatment and discuss how the patient can incorporate goal setting into their daily lives to aide in recovery.    Participation Level:  Did Not Attend   Ethan Sutton 09/14/2023, 10:31 AM

## 2023-09-14 NOTE — Plan of Care (Signed)
  Problem: Education: Goal: Mental status will improve Outcome: Progressing Goal: Verbalization of understanding the information provided will improve Outcome: Progressing   Problem: Health Behavior/Discharge Planning: Goal: Identification of resources available to assist in meeting health care needs will improve Outcome: Progressing   Problem: Physical Regulation: Goal: Ability to maintain clinical measurements within normal limits will improve Outcome: Progressing   Problem: Safety: Goal: Periods of time without injury will increase Outcome: Progressing   Problem: Coping: Goal: Coping ability will improve Outcome: Progressing

## 2023-09-14 NOTE — Progress Notes (Signed)
 Pt pleasant on engagement, was visible in milieu and participated in unit activity.  Endorsed anxiety rated mild and informed that he his here for medication management; that the medication he was taking before coming to the hospital was not working.   09/13/23 2200  Psych Admission Type (Psych Patients Only)  Admission Status Voluntary  Psychosocial Assessment  Patient Complaints None  Eye Contact Fair  Facial Expression Anxious  Affect Appropriate to circumstance;Anxious  Speech Logical/coherent  Interaction Assertive  Motor Activity Other (Comment) (WDL)  Appearance/Hygiene Unremarkable;In scrubs  Behavior Characteristics Appropriate to situation  Mood Anxious  Thought Process  Coherency WDL  Content WDL  Delusions None reported or observed  Perception WDL  Hallucination None reported or observed  Judgment WDL  Confusion None  Danger to Self  Current suicidal ideation? Denies  Agreement Not to Harm Self Yes  Description of Agreement VERBAL  Danger to Others  Danger to Others None reported or observed    Problem: Education: Goal: Knowledge of [place] [facility] Education information/materials will improve Outcome: Progressing Goal: Emotional status will improve Outcome: Progressing Goal: Mental status will improve Outcome: Progressing Goal: Verbalization of understanding the information provided will improve Outcome: Progressing   Problem: Activity: Goal: Interest or engagement in activities will improve Outcome: Progressing Goal: Sleeping patterns will improve Outcome: Progressing   Problem: Coping: Goal: Ability to verbalize frustrations and anger appropriately will improve Outcome: Progressing Goal: Ability to demonstrate self-control will improve Outcome: Progressing

## 2023-09-15 MED ORDER — FLUOXETINE HCL 10 MG PO CAPS: 10.0000 mg | ORAL_CAPSULE | Freq: Every day | ORAL | 0 refills | Status: DC

## 2023-09-15 MED ORDER — OLANZAPINE 10 MG PO TABS: 10.0000 mg | ORAL_TABLET | Freq: Two times a day (BID) | ORAL | 0 refills | Status: DC

## 2023-09-15 NOTE — Progress Notes (Signed)
 [facility] Discharge Suicide Risk Assessment   Principal Problem: Schizoaffective disorder [facility]) Discharge Diagnoses: Principal Problem:   Schizoaffective disorder (HCC) Active Problems:   MDD (major depressive disorder), recurrent severe, without psychosis (HCC)   Total Time spent with patient: 30 minutes  Musculoskeletal: Strength & Muscle Tone: within normal limits Gait & Station: normal   Psychiatric Specialty Exam  Presentation  General Appearance:  Casual  Eye Contact: Fair  Speech: Clear and Coherent  Speech Volume: Normal  Handedness: Right   Mood and Affect  Mood: Euthymic  Duration of Depression Symptoms: N/A  Affect: Congruent   Thought Process  Thought Processes: Coherent  Descriptions of Associations:Intact  Orientation:Full (Time, Place and Person)  Thought Content:WDL  History of Schizophrenia/Schizoaffective disorder:Yes  Duration of Psychotic Symptoms:Greater than six months  Hallucinations:Hallucinations: None  Ideas of Reference:None  Suicidal Thoughts:Suicidal Thoughts: No  Homicidal Thoughts:Homicidal Thoughts: No   Sensorium  Memory: Immediate Fair; Recent Fair  Judgment: Fair  Insight: Fair   Art therapist  Concentration: Fair  Attention Span: Fair  Recall: [employer] of Knowledge: Fair  Language: Fair   Psychomotor Activity  Psychomotor Activity: Psychomotor Activity: Normal   Assets  Assets: Communication Skills; Desire for Improvement; Social Support; Housing   Sleep  Sleep: Sleep: Good  Estimated Sleeping Duration (Last 24 Hours): 9.75-12.00 hours  Physical Exam: Physical Exam ROS Blood pressure 125/60, pulse 60, temperature (!) 97.5 F (36.4 C), resp. rate 18, height 5' 11 (1.803 m), weight 88.5 kg, SpO2 99%. Body mass index is 27.2 kg/m.  Mental Status Per Nursing Assessment::   On Admission:  NA  Demographic Factors:  Male  See discharge summary for full risk  assessment.   Cognitive Features That Contribute To Risk:  None    Suicide Risk:  Minimal: No identifiable suicidal ideation.  Patients presenting with no risk factors but with morbid ruminations; may be classified as minimal risk based on the severity of the depressive symptoms   Follow-up Information     [facility] Follow up.   Why: Virtual assessment for therapy and psychiatry is 09/23/23 at 10 AM. Contact information: [address]  [address] [place] [address] [phone]                   Hoy CHRISTELLA Pinal, NP 09/15/2023, 11:16 AM

## 2023-09-15 NOTE — Progress Notes (Signed)
   09/15/23 1000  Psych Admission Type (Psych Patients Only)  Admission Status Voluntary/72 hour document signed  Date 72 hour document signed  10/13/23  Time 72 hour document signed  1545  Provider Notified (First and Last Name) (see details for LINK to note) Hoy Pinal, NP  Psychosocial Assessment  Patient Complaints None  Eye Contact Brief  Facial Expression Flat  Affect Appropriate to circumstance  Speech Logical/coherent  Interaction Minimal  Motor Activity Other (Comment) (appropriate for developmental age)  Appearance/Hygiene Unremarkable  Behavior Characteristics Cooperative  Mood Pleasant  Thought Process  Coherency WDL  Content WDL  Delusions None reported or observed  Perception WDL  Hallucination None reported or observed  Judgment WDL  Confusion None  Danger to Self  Current suicidal ideation? Denies  Self-Injurious Behavior No self-injurious ideation or behavior indicators observed or expressed   Danger to Others  Danger to Others None reported or observed

## 2023-09-15 NOTE — Progress Notes (Signed)
   09/15/23 0400  Psych Admission Type (Psych Patients Only)  Admission Status Voluntary  Psychosocial Assessment  Patient Complaints None  Eye Contact Fair  Facial Expression Anxious  Affect Appropriate to circumstance  Speech Logical/coherent  Interaction Assertive  Motor Activity Unsteady  Appearance/Hygiene Unremarkable;In scrubs  Behavior Characteristics Appropriate to situation  Mood Anxious  Aggressive Behavior  Effect No apparent injury  Thought Process  Coherency WDL  Content WDL  Delusions None reported or observed  Perception WDL  Hallucination None reported or observed  Judgment WDL  Confusion None  Danger to Self  Current suicidal ideation? Denies  Agreement Not to Harm Self Yes  Danger to Others  Danger to Others None reported or observed

## 2023-09-15 NOTE — Progress Notes (Signed)
  [facility] Adult Case Management Discharge Plan :  Will you be returning to the same living situation after discharge:  Yes,  pt plans to return home.  At discharge, do you have transportation home?: Yes,  pt received taxi voucher. Do you have the ability to pay for your medications: No.  Release of information consent forms completed and in the chart;  Patient's signature needed at discharge.  Patient to Follow up at:  Follow-up Information     [facility] Follow up.   Why: Virtual assessment for therapy and psychiatry is 09/23/23 at 10 AM. Contact information: [address]  [address] [place] [address] [phone]         [facility] Follow up.   Why: Referral has been made. Contact information: [address] [place] [address] [phone]         [facility] Follow up.   Why: Referral has been made. Contact information: [address] [place] [address] [phone]                 Next level of care provider has access to [facility] Link:no  Safety Planning and Suicide Prevention discussed: Yes,  SPE completed with pt.      Has patient been referred to the Quitline?: Patient refused referral for treatment  Patient has been referred for addiction treatment: Patient refused referral for treatment.  Nadara JONELLE Fam, LCSW 09/15/2023, 12:55 PM

## 2023-09-15 NOTE — Plan of Care (Signed)
?  Problem: Education: Goal: Knowledge of [facility] Education information/materials will improve Outcome: Adequate for Discharge Goal: Emotional status will improve Outcome: Adequate for Discharge Goal: Mental status will improve Outcome: Adequate for Discharge Goal: Verbalization of understanding the information provided will improve Outcome: Adequate for Discharge   Problem: Activity: Goal: Interest or engagement in activities will improve Outcome: Adequate for Discharge Goal: Sleeping patterns will improve Outcome: Adequate for Discharge   Problem: Coping: Goal: Ability to verbalize frustrations and anger appropriately will improve Outcome: Adequate for Discharge Goal: Ability to demonstrate self-control will improve Outcome: Adequate for Discharge   Problem: Health Behavior/Discharge Planning: Goal: Identification of resources available to assist in meeting health care needs will improve Outcome: Adequate for Discharge Goal: Compliance with treatment plan for underlying cause of condition will improve Outcome: Adequate for Discharge   Problem: Physical Regulation: Goal: Ability to maintain clinical measurements within normal limits will improve Outcome: Adequate for Discharge   Problem: Safety: Goal: Periods of time without injury will increase Outcome: Adequate for Discharge   Problem: Education: Goal: Ability to make informed decisions regarding treatment will improve Outcome: Adequate for Discharge   Problem: Coping: Goal: Coping ability will improve Outcome: Adequate for Discharge   Problem: Health Behavior/Discharge Planning: Goal: Identification of resources available to assist in meeting health care needs will improve Outcome: Adequate for Discharge   Problem: Medication: Goal: Compliance with prescribed medication regimen will improve Outcome: Adequate for Discharge   Problem: Self-Concept: Goal: Ability to disclose and discuss suicidal ideas  will improve Outcome: Adequate for Discharge Goal: Will verbalize positive feelings about self Outcome: Adequate for Discharge   

## 2023-09-15 NOTE — Discharge Summary (Signed)
 Physician Discharge Summary Note  Patient:  Ethan Sutton is an 38 y.o., male MRN:  [id number] DOB:  03-01-1985 Patient phone:  [phone] (home)  Patient address:   [address] [place] [facility] [id number],   Total time spent: 40 min Date of Admission:  09/12/2023 Date of Discharge: 08/18.2025  Reason for Admission:  Patient is a 38 year old African American male with a past psychiatric history schizoaffective disorder and amphetamine abuse who is admitted following presentation to the emergency department requesting medication adjustment. He reports that his current regimen is no longer effective, stating, "My medicine wasn't working." He endorses vague suicidal thoughts without a specific plan, as well as auditory hallucinations. Reports the hallucinations have been on going, however the SI started within the last week and he is unable to identify a stressor.  He denies anhedonia, hopelessness, helplessness, guilt, worthlessness, and fatigue.  He does note poor sleep he denies sleep pattern change.  He denies grandiose thoughts or impulsivity.  He is linear on exam today.  He denies current SI.  He has no outpatient provider due to the lack of transportation.  He has a smart phone but does not have service he does have access to Wi-Fi.  Contact mother [phone] ) for collateral.  She indicates that his medications have not been effective recently and he has been having a tough time over the past 2 weeks.  She denies any current safety concerns.  She indicates that he does not have an outpatient psychiatric provider and that he recently got his meds from the ED.  Reports patient could do video visits from home due to Wi-Fi connection but if he is away from home he would not be able to. She reports increased pacing, poor sleep, responding to internal stimuli, and disorganized speech. She is concerned about escalating behaviors and states that if they continue, he may no longer be able to live  at home but states that at present he can return home at discharge.The family requests home-based services similar to what another sibling receives.  He denies homicidal ideations today.  Reported auditory hallucinations but was not internally preoccupied during the interview.  He gave very short answers and did not elaborate during questioning.   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 Living Situation: with mother and father Spiritual Hx: Denies Access to weapons/lethal means: non e    Substance History Alcohol: Occasional History of alcohol withdrawal seizures denies History of DT's denies Illicit drugs: Denies  Principal Problem: Schizoaffective disorder (HCC) Discharge Diagnoses: Principal Problem:   Schizoaffective disorder (HCC) Active Problems:   MDD (major depressive disorder), recurrent severe, without psychosis (HCC)   Social History:  Social History   Substance and Sexual Activity  Alcohol Use Not Currently   Comment: ive been in prison for a year i haven't used any     Social History   Substance and Sexual Activity  Drug Use Not Currently    Social History   Socioeconomic History   Marital status: Single  Spouse name: Not on file   Number of children: Not on file   Years of education: Not on file   Highest education level: Not on file  Occupational History   Not on file  Tobacco Use   Smoking  status: Some Days    Current packs/day: 0.00    Types: Cigarettes    Last attempt to quit: 2019    Years since quitting: 6.6   Smokeless tobacco: Never   Tobacco comments:    Pt declined nicotine  replacement 09/12/23  Vaping Use   Vaping status: Never Used  Substance and Sexual Activity   Alcohol use: Not Currently    Comment: ive been in prison for a year i haven't used any   Drug use: Not Currently   Sexual activity: Not Currently    Birth control/protection: Condom, Abstinence  Other Topics Concern   Not on file  Social History Narrative   Not on file   Social Drivers of Health   Financial Resource Strain: Not on file  Food Insecurity: No Food Insecurity (09/12/2023)   Hunger Vital Sign    Worried About Running Out of Food in the Last Year: Never true    Ran Out of Food in the Last Year: Never true  Transportation Needs: No Transportation Needs (09/12/2023)   PRAPARE - Administrator, Civil Service (Medical): No    Lack of Transportation (Non-Medical): No  Physical Activity: Not on file  Stress: Not on file  Social Connections: Not on file   Past Medical History:  Past Medical History:  Diagnosis Date   Depression 02/09/2023   Schizophrenia [facility])     Past Surgical History:  Procedure Laterality Date   NO PAST SURGERIES     Family History: History reviewed. No pertinent family history.  Hospital Course:   Patient initially presented to the emergency department voluntarily requesting medication adjustment, stating, "My medicine wasn't working." He endorsed vague suicidal ideation without a specific plan as well as auditory hallucinations. While the hallucinations were reported as chronic, the suicidal ideation had reportedly emerged within the past week. He was unable to identify a clear psychosocial trigger. Inpatient psychiatric hospitalization was indicated for crisis stabilization, safety monitoring, medication adjustment, and symptom management.  During  the course of hospitalization, patient was evaluated and monitored for safety. His psychiatric medications were reviewed and adjusted to address the reported lack of efficacy and persistent symptoms. Patient demonstrated improvement in mood, organization of thought, and overall functioning. Auditory hallucinations diminished in severity and suicidal ideation resolved. He participated in individual and group therapy and tolerated medications without side effects.  Detailed risk assessment is complete based on clinical exam and individual risk factors and acute suicide risk is low and acute violence risk is low.  Patient is discharged to home with outpatient psychiatric follow-up. He denies suicidal or homicidal ideation and no active psychosis is noted at time of discharge. He is clear, linear, and goal-directed in thought process. He verbalizes understanding of medication regimen and importance of follow-up care.   Currently, all modifiable risk of harm to self/harm to others have been addressed and patient is no longer appropriate for the acute inpatient setting and is able to continue treatment for mental health needs in the community with the supports as indicated below.  Patient is educated and verbalized understanding of discharge plan of care including medications, follow-up appointments, mental health resources and further crisis services in the community.  He is instructed to call 911 or  present to the nearest emergency room should he experience any decompensation in mood, disturbance of bowel or return of suicidal/homicidal ideations.  Patient verbalizes understanding of this education and agrees to this plan of care     Psychiatric Specialty Exam:  Presentation  General Appearance:  Casual   Eye Contact: Fair   Speech: Clear and Coherent   Speech Volume: Normal       Mood and Affect  Mood: Euthymic   Affect: Congruent     Thought Process  Thought Processes: Coherent    Descriptions of Associations:Intact   Orientation:Full (Time, Place and Person)   Thought Content:WDL   Hallucinations:Hallucinations: None   Ideas of Reference:None   Suicidal Thoughts:Suicidal Thoughts: No   Homicidal Thoughts:Homicidal Thoughts: No     Sensorium  Memory: Immediate Fair; Recent Fair   Judgment: Fair   Insight: Fair     Art therapist  Concentration: Fair   Attention Span: Fair   Recall: [employer] of Knowledge: Fair   Language: Fair     Psychomotor Activity  Psychomotor Activity: Psychomotor Activity: Normal  Musculoskeletal: Strength & Muscle Tone: within normal limits Gait & Station: normal Assets  Assets: Manufacturing systems engineer; Desire for Improvement; Social Support; Housing   Sleep  Sleep: Sleep: Good    Physical Exam: Physical Exam ROS Blood pressure 125/60, pulse 60, temperature (!) 97.5 F (36.4 C), resp. rate 18, height 5' 11 (1.803 m), weight 88.5 kg, SpO2 99%. Body mass index is 27.2 kg/m.   Social History   Tobacco Use  Smoking Status Some Days   Current packs/day: 0.00   Types: Cigarettes   Last attempt to quit: 2019   Years since quitting: 6.6  Smokeless Tobacco Never  Tobacco Comments   Pt declined nicotine  replacement 09/12/23   Tobacco Cessation:  N/A, patient does not currently use tobacco products   Blood Alcohol level:  Lab Results  Component Value Date   ETH <15 09/12/2023   ETH <10 02/09/2023    Metabolic Disorder Labs:  No results found for: HGBA1C, MPG No results found for: PROLACTIN No results found for: CHOL, TRIG, HDL, CHOLHDL, VLDL, LDLCALC  See Psychiatric Specialty Exam and Suicide Risk Assessment completed by Attending Physician prior to discharge.  Discharge destination:  Home  Is patient on multiple antipsychotic therapies at discharge:  No   Has Patient had three or more failed trials of antipsychotic monotherapy by history:   No  Recommended Plan for Multiple Antipsychotic Therapies: NA  Discharge Instructions     Diet - low sodium heart healthy   Complete by: As directed    Increase activity slowly   Complete by: As directed       Allergies as of 09/15/2023   No Known Allergies      Medication List     STOP taking these medications    ibuprofen  200 MG tablet Commonly known as: ADVIL        TAKE these medications      Indication  FLUoxetine  10 MG capsule Commonly known as: PROZAC  Take 1 capsule (10 mg total) by mouth daily. Start taking on: September 16, 2023  Indication: Depression   melatonin 5 MG Tabs Take 1 tablet (5 mg total) by mouth at bedtime.  Indication: Trouble Sleeping   OLANZapine  10 MG tablet Commonly known as: ZYPREXA  Take 1 tablet (10 mg total) by mouth 2 (two) times daily. What changed:  medication strength how much to take when to take this  Indication: psychosis, mood  traZODone  50 MG tablet Commonly known as: DESYREL  Take 1 tablet (50 mg total) by mouth at bedtime as needed for sleep.  Indication: Trouble Sleeping, Schizophrenia with Depression        Follow-up Information     [facility] Follow up.   Why: Virtual assessment for therapy and psychiatry is 09/23/23 at 10 AM. Contact information: [address]  [address] [place] [address] [phone]         [facility] Follow up.   Why: Referral has been made. Contact information: [address] [place] [address] [phone]                    Signed: Hoy CHRISTELLA Pinal, NP 09/15/2023, 12:10 PM

## 2023-09-15 NOTE — Group Note (Signed)
 Date:  09/15/2023 Time:  10:25 AM  Group Topic/Focus:  Goals Group:   The focus of this group is to help patients establish daily goals to achieve during treatment and discuss how the patient can incorporate goal setting into their daily lives to aide in recovery.    Participation Level:  Did Not Attend   Skippy LITTIE Bennett 09/15/2023, 10:25 AM

## 2023-09-15 NOTE — Progress Notes (Signed)
 Patient denies SI/I/AVH at this time. Discharge instructions, AVS, prescriptions, and transition record reviewed with patient. Patient agrees to comply with medication management, follow-up visit and outpatient therapy. Patient belongings returned to patient. Patient questions and concerns addressed and answered.  Patient ambulatory off unit. Patient discharged home via taxi.

## 2023-09-15 NOTE — Plan of Care (Signed)
   Problem: Education: Goal: Knowledge of [facility] Education information/materials will improve Outcome: Progressing Goal: Emotional status will improve Outcome: Progressing Goal: Mental status will improve Outcome: Progressing Goal: Verbalization of understanding the information provided will improve Outcome: Progressing

## 2023-09-15 NOTE — [facility] Counselor (Signed)
 ACTT referrals were completed and sent over to [facility] and [facility].   Nadara SAUNDERS. Chaim, MSW, LCSW, LCAS 09/15/2023 12:48 PM

## 2023-09-16 ENCOUNTER — Telehealth: Payer: Self-pay | Admitting: Gerontology

## 2023-09-16 NOTE — Telephone Encounter (Signed)
 Spoke with patient to confirm new patient appt per [facility] referral. Pt will call back to confirm 09/25/23 appt at 3 pm once he has arranged transportation.

## 2023-09-25 ENCOUNTER — Ambulatory Visit: Payer: Self-pay | Admitting: Gerontology

## 2023-09-26 ENCOUNTER — Emergency Department
Admission: EM | Admit: 2023-09-26 | Discharge: 2023-09-27 | Disposition: A | Discharge status: Other Acute Inpatient Hospital | Payer: Self-pay

## 2023-09-26 DIAGNOSIS — Z79899 Other long term (current) drug therapy: Secondary | ICD-10-CM | POA: Insufficient documentation

## 2023-09-26 DIAGNOSIS — G4700 Insomnia, unspecified: Secondary | ICD-10-CM

## 2023-09-26 DIAGNOSIS — R45851 Suicidal ideations: Secondary | ICD-10-CM | POA: Insufficient documentation

## 2023-09-26 DIAGNOSIS — F251 Schizoaffective disorder, depressive type: Secondary | ICD-10-CM | POA: Insufficient documentation

## 2023-09-26 LAB — CBC
HCT: 50.8 % (ref 39.0–52.0)
Hemoglobin: 17.6 g/dL — ABNORMAL HIGH (ref 13.0–17.0)
MCH: 30.9 pg (ref 26.0–34.0)
MCHC: 34.6 g/dL (ref 30.0–36.0)
MCV: 89.1 fL (ref 80.0–100.0)
Platelets: 330 K/uL (ref 150–400)
RBC: 5.7 MIL/uL (ref 4.22–5.81)
RDW: 12.7 % (ref 11.5–15.5)
WBC: 9.5 K/uL (ref 4.0–10.5)
nRBC: 0 % (ref 0.0–0.2)

## 2023-09-26 LAB — COMPREHENSIVE METABOLIC PANEL WITH GFR
ALT: 27 U/L (ref 0–44)
AST: 31 U/L (ref 15–41)
Albumin: 4.7 g/dL (ref 3.5–5.0)
Alkaline Phosphatase: 82 U/L (ref 38–126)
Anion gap: 12 (ref 5–15)
BUN: 6 mg/dL (ref 6–20)
CO2: 28 mmol/L (ref 22–32)
Calcium: 9.8 mg/dL (ref 8.9–10.3)
Chloride: 99 mmol/L (ref 98–111)
Creatinine, Ser: 1.01 mg/dL (ref 0.61–1.24)
GFR, Estimated: >60 mL/min (ref >60)
Glucose, Bld: 86 mg/dL (ref 70–99)
Potassium: 3.2 mmol/L — ABNORMAL LOW (ref 3.5–5.1)
Sodium: 139 mmol/L (ref 135–145)
Total Bilirubin: 1.1 mg/dL (ref 0.0–1.2)
Total Protein: 8.8 g/dL — ABNORMAL HIGH (ref 6.5–8.1)

## 2023-09-26 LAB — ETHANOL: Alcohol, Ethyl (B): <15 mg/dL (ref <15)

## 2023-09-26 MED ORDER — POTASSIUM CHLORIDE CRYS ER 20 MEQ PO TBCR
40.0000 meq | EXTENDED_RELEASE_TABLET | Freq: Once | ORAL | Status: DC
  Filled 2023-09-26: qty 2

## 2023-09-26 MED ORDER — MELATONIN 5 MG PO TABS
5.0000 mg | ORAL_TABLET | Freq: Every day | ORAL | Status: DC
  Administered 2023-09-26: 5 mg via ORAL
  Filled 2023-09-26: qty 1

## 2023-09-26 MED ORDER — TRAZODONE HCL 50 MG PO TABS: 50.0000 mg | ORAL_TABLET | Freq: Every evening | ORAL | Status: DC | PRN

## 2023-09-26 MED ORDER — DIAZEPAM 5 MG PO TABS
5.0000 mg | ORAL_TABLET | Freq: Once | ORAL | Status: AC
  Administered 2023-09-26: 5 mg via ORAL
  Filled 2023-09-26: qty 1

## 2023-09-26 NOTE — ED Notes (Signed)
 Pt was offered [place] sandwich and refused

## 2023-09-26 NOTE — ED Notes (Signed)
 Pt sleeping. Will give potassium once pt wakes up.

## 2023-09-26 NOTE — BH Assessment (Signed)
 Comprehensive Clinical Assessment (CCA) Note  09/26/2023 Ethan Sutton [id number]  Chief Complaint: Patient is a 38 year old male presenting to [facility] ED voluntarily. Per triage note Pt. In via pov from home, c/o suicidal thoughts, states he had his meds adjusted recently and they have made him feel worse, states he would overdose on heroin. During assessment patient appears alert and oriented x4, calm and cooperative. Patient reports they put me on new medications when I was here last time, I haven't slept in 2 days and when I don't sleep I have thoughts about using. I used to drink alcohol but I stopped because it was making me angry. Patient reports before his medications were changed he had no issues with his old regimen. Patient was recently admitted here at [facility], where he reports that his medications were changed but he is unsure of what new medications he was put on. Patient was recently discharged from [facility] on 09/15/23. Patient currently lives with mother, he reports no sleep in 2 days and is not currently set up with a outpatient provider, per his chart review he was referred to 2 different ACT teams and had a virtual appointment with [facility] on 09/23/23 that he didn't make it to. Patient continues to have passive SI to use drugs or overdose on heroin, he denies HI/AH/VH. Chief Complaint  Patient presents with   Psychiatric Evaluation   Visit Diagnosis: Schizoaffective disorder    CCA Screening, Triage and Referral (STR)  Patient Reported Information How did you hear about us ? Self  Referral name: No data recorded Referral phone number: No data recorded  Whom do you see for routine medical problems? No data recorded Practice/Facility Name: No data recorded Practice/Facility Phone Number: No data recorded Name of Contact: No data recorded Contact Number: No data recorded Contact Fax Number: No data recorded Prescriber Name: No data recorded Prescriber Address (if known):  No data recorded  What Is the Reason for Your Visit/Call Today? Pt. In via pov from home, c/o suicidal thoughts, states he had his meds adjusted recently and they have made him feel worse, states he would overdose on heroin  How Long Has This Been Causing You Problems? > than 6 months  What Do You Feel Would Help You the Most Today? Treatment for Depression or other mood problem; Medication(s)   Have You Recently Been in Any Inpatient Treatment (Hospital/Detox/Crisis Center/28-Day Program)? No data recorded Name/Location of Program/Hospital:No data recorded How Long Were You There? No data recorded When Were You Discharged? No data recorded  Have You Ever Received Services From [facility] Before? No data recorded Who Do You See at [facility]? No data recorded  Have You Recently Had Any Thoughts About Hurting Yourself? Yes  Are You Planning to Commit Suicide/Harm Yourself At This time? No   Have you Recently Had Thoughts About Hurting Someone Sherral? No  Explanation: Pt endorseds intrusive thoughts of SI with a plan to overdose on Fentanyl and OTC medications.   Have You Used Any Alcohol or Drugs in the Past 24 Hours? No  How Long Ago Did You Use Drugs or Alcohol? No data recorded What Did You Use and How Much? No data recorded  Do You Currently Have a Therapist/Psychiatrist? No  Name of Therapist/Psychiatrist: [name]   Have You Been Recently Discharged From Any Office Practice or Programs? No  Explanation of Discharge From Practice/Program: N/A     CCA Screening Triage Referral Assessment Type of Contact: Face-to-Face  Is this  Initial or Reassessment? Initial Assessment  Date Telepsych consult ordered in [facility]:  01/11/23  Time Telepsych consult ordered in [facility]:  1727   Patient Reported Information Reviewed? No data recorded Patient Left Without Being Seen? No data recorded Reason for Not Completing Assessment: No data recorded  Collateral Involvement: None  noted   Does Patient Have a Court Appointed Legal Guardian? No data recorded Name and Contact of Legal Guardian: No data recorded If Minor and Not Living with Parent(s), Who has Custody? N/A  Is CPS involved or ever been involved? Never  Is APS involved or ever been involved? Never   Patient Determined To Be At Risk for Harm To Self or Others Based on Review of Patient Reported Information or Presenting Complaint? No  Method: Plan with intent and identified person  Availability of Means: No access or NA  Intent: Clearly intends on inflicting harm that could cause death  Notification Required: No need or identified person  Additional Information for Danger to Others Potential: -- (N/A)  Additional Comments for Danger to Others Potential: None noted  Are There Guns or Other Weapons in Your Home? No  Types of Guns/Weapons: NA  Are These Weapons Safely Secured?                            -- (N/A)  Who Could Verify You Are Able To Have These Secured: NA  Do You Have any Outstanding Charges, Pending Court Dates, Parole/Probation? Pt denied having pending legal charges.  Contacted To Inform of Risk of Harm To Self or Others: -- (N/A)   Location of Assessment: [facility] ED   Does Patient Present under Involuntary Commitment? No  IVC Papers Initial File Date: No data recorded  [place] of Residence: [place]   Patient Currently Receiving the Following Services: Not Receiving Services   Determination of Need: Emergent (2 hours)   Options For Referral: Inpatient Hospitalization; ED Referral     CCA Biopsychosocial Intake/Chief Complaint:  No data recorded Current Symptoms/Problems: No data recorded  Patient Reported Schizophrenia/Schizoaffective Diagnosis in Past: Yes   Strengths: Stable housing, some insight and a support system.  Preferences: No data recorded Abilities: No data recorded  Type of Services Patient Feels are Needed: No data recorded  Initial  Clinical Notes/Concerns: No data recorded  Mental Health Symptoms Depression:  Change in energy/activity; Difficulty Concentrating; Fatigue; Sleep (too much or little)   Duration of Depressive symptoms: Greater than two weeks   Mania:  None   Anxiety:   Difficulty concentrating; Sleep   Psychosis:  None   Duration of Psychotic symptoms: Greater than six months   Trauma:  N/A   Obsessions:  N/A   Compulsions:  N/A   Inattention:  N/A   Hyperactivity/Impulsivity:  N/A   Oppositional/Defiant Behaviors:  N/A   Emotional Irregularity:  N/A   Other Mood/Personality Symptoms:  None noted    Mental Status Exam Appearance and self-care  Stature:  Tall   Weight:  Average weight   Clothing:  Neat/clean; Age-appropriate   Grooming:  Normal   Cosmetic use:  None   Posture/gait:  Normal   Motor activity:  -- (Within normal range)   Sensorium  Attention:  Normal   Concentration:  Normal   Orientation:  X5   Recall/memory:  Normal   Affect and Mood  Affect:  Appropriate   Mood:  Depressed; Anxious   Relating  Eye contact:  Normal   Facial expression:  Anxious; Responsive   Attitude toward examiner:  Cooperative   Thought and Language  Speech flow: Clear and Coherent   Thought content:  Appropriate to Mood and Circumstances   Preoccupation:  None   Hallucinations:  None   Organization:  No data recorded  [employer] of Knowledge:  Fair   Intelligence:  Average   Abstraction:  Functional   Judgement:  Fair   Dance movement psychotherapist:  Adequate   Insight:  Fair   Decision Making:  Normal   Social Functioning  Social Maturity:  Responsible   Social Judgement:  Normal   Stress  Stressors:  Transitions   Coping Ability:  Human resources officer Deficits:  None   Supports:  Family     Religion: Religion/Spirituality Are You A Religious Person?: No  Leisure/Recreation: Leisure / Recreation Do You Have Hobbies?:  No  Exercise/Diet: Exercise/Diet Do You Exercise?: No Have You Gained or Lost A Significant Amount of Weight in the Past Six Months?: No Do You Follow a Special Diet?: No Do You Have Any Trouble Sleeping?: Yes Explanation of Sleeping Difficulties: Patient recently went two days without sleeping.   CCA Employment/Education Employment/Work Situation: Employment / Work Situation Employment Situation: Unemployed Patient's Job has Been Impacted by Current Illness: No Has Patient ever Been in Equities trader?: No  Education: Education Is Patient Currently Attending School?: No Did Theme park manager?: No Did You Have An Individualized Education Program (IIEP): No Did You Have Any Difficulty At [employer]?: No Patient's Education Has Been Impacted by Current Illness: No   CCA Family/Childhood History Family and Relationship History: Family history Marital status: Single Does patient have children?: Yes How many children?: 3 How is patient's relationship with their children?: Ok.  Childhood History:  Childhood History By whom was/is the patient raised?: Both parents Description of patient's current relationship with siblings: Good. Did patient suffer any verbal/emotional/physical/sexual abuse as a child?: No Did patient suffer from severe childhood neglect?: No Has patient ever been sexually abused/assaulted/raped as an adolescent or adult?: No Was the patient ever a victim of a crime or a disaster?: No Witnessed domestic violence?: No Has patient been affected by domestic violence as an adult?: No  Child/Adolescent Assessment:     CCA Substance Use Alcohol/Drug Use: Alcohol / Drug Use Pain Medications: See MAR Prescriptions: See MAR Over the Counter: See MAR History of alcohol / drug use?: No history of alcohol / drug abuse Longest period of sobriety (when/how long): n/a Negative Consequences of Use:  (NA) Withdrawal Symptoms: None                          ASAM's:  Six Dimensions of Multidimensional Assessment  Dimension 1:  Acute Intoxication and/or Withdrawal Potential:   Dimension 1:  Description of individual's past and current experiences of substance use and withdrawal: Pt has a hx of alcohol and meth abuse  Dimension 2:  Biomedical Conditions and Complications:   Dimension 2:  Description of patient's biomedical conditions and  complications: n/a  Dimension 3:  Emotional, Behavioral, or Cognitive Conditions and Complications:  Dimension 3:  Description of emotional, behavioral, or cognitive conditions and complications: Pt has a hx of Schizophreniform  Dimension 4:  Readiness to Change:  Dimension 4:  Description of Readiness to Change criteria: Willing to engage in treatment  Dimension 5:  Relapse, Continued use, or Continued Problem Potential:  Dimension 5:  Relapse, continued use, or continued problem potential critiera  description: Minimal relapse potential  Dimension 6:  Recovery/Living Environment:  Dimension 6:  Recovery/Iiving environment criteria description: Has a supportive environment  ASAM Severity Score: ASAM's Severity Rating Score: 6  ASAM Recommended Level of Treatment: ASAM Recommended Level of Treatment: Level II Intensive Outpatient Treatment   Substance use Disorder (SUD) Substance Use Disorder (SUD)  Checklist Symptoms of Substance Use: Continued use despite having a persistent/recurrent physical/psychological problem caused/exacerbated by use, Continued use despite persistent or recurrent social, interpersonal problems, caused or exacerbated by use  Recommendations for Services/Supports/Treatments: Recommendations for Services/Supports/Treatments Recommendations For Services/Supports/Treatments: Medication Management, Individual Therapy  DSM5 Diagnoses: Patient Active Problem List   Diagnosis Date Noted   Schizoaffective disorder (HCC) 09/13/2023   MDD (major depressive disorder), recurrent severe, without  psychosis (HCC) 09/12/2023   Depression 02/09/2023   Major depressive disorder, recurrent episode (HCC) 01/22/2023   Suicidal ideation 01/12/2023   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

## 2023-09-26 NOTE — ED Provider Notes (Signed)
 [facility] Provider Note    Event Date/Time   First MD Initiated Contact with Patient 09/26/23 2049     (approximate)   History   Psychiatric Evaluation   HPI  Ethan Sutton is a 38 y.o. male  male with a past psychiatric history schizoaffective disorder and amphetamine abuse who presents with suicidal ideation.  Patient states that he was he will just a week ago and his medications were changed.  He states that he has not been sleeping well since.  He states that he would plan to do suicide by overdose but he does want help.  Denies any access to guns.      Physical Exam   Triage Vital Signs: ED Triage Vitals  Encounter Vitals Group     BP 09/26/23 1949 129/84     Girls Systolic BP Percentile --      Girls Diastolic BP Percentile --      Boys Systolic BP Percentile --      Boys Diastolic BP Percentile --      Pulse Rate 09/26/23 1949 79     Resp 09/26/23 1949 17     Temp 09/26/23 1949 98.7 F (37.1 C)     Temp Source 09/26/23 1949 Oral     SpO2 09/26/23 1949 98 %     Weight 09/26/23 1951 176 lb 5.9 oz (80 kg)     Height 09/26/23 1951 6' (1.829 m)     Head Circumference --      Peak Flow --      Pain Score 09/26/23 1951 0     Pain Loc --      Pain Education --      Exclude from Growth Chart --     Most recent vital signs: Vitals:   09/26/23 1949  BP: 129/84  Pulse: 79  Resp: 17  Temp: 98.7 F (37.1 C)  SpO2: 98%    Nursing Triage Note reviewed. Vital signs reviewed and patients oxygen saturation is normoxic  General: Patient is well nourished, well developed, awake and alert, resting comfortably in no acute distress Head: Normocephalic and atraumatic Eyes: Normal inspection, extraocular muscles intact, no conjunctival pallor Ear, nose, throat: Normal external exam Neck: Normal range of motion Respiratory: Patient is in no respiratory distress, lungs CTAB Cardiovascular: Patient is not tachycardic, RRR without murmur  appreciated GI: Abd SNT with no guarding or rebound  Back: Normal inspection of the back with good strength and range of motion throughout all ext Extremities: pulses intact with good cap refills, no LE pitting edema or calf tenderness Neuro: The patient is alert and oriented to person, place, and time, appropriately conversive, with 5/5 bilat UE/LE strength, no gross motor or sensory defects noted. Coordination appears to be adequate. Skin: Warm, dry, and intact Psych: suicidal ideation, no homicidal ideation no response to internal stimuli  ED Results / Procedures / Treatments   Labs (all labs ordered are listed, but only abnormal results are displayed) Labs Reviewed  COMPREHENSIVE METABOLIC PANEL WITH GFR - Abnormal; Notable for the following components:      Result Value   Potassium 3.2 (*)    Total Protein 8.8 (*)    All other components within normal limits  CBC - Abnormal; Notable for the following components:   Hemoglobin 17.6 (*)    All other components within normal limits  ETHANOL  URINE DRUG SCREEN, QUALITATIVE ([facility] ONLY)     EKG None  RADIOLOGY None  PROCEDURES:  Critical Care performed: No  Procedures   MEDICATIONS ORDERED IN ED: Medications  melatonin tablet 5 mg (5 mg Oral Given 09/26/23 10/11/15)  traZODone  (DESYREL ) tablet 50 mg (has no administration in time range)  potassium chloride  SA (KLOR-CON  M) CR tablet 40 mEq (has no administration in time range)  diazepam  (VALIUM ) tablet 5 mg (5 mg Oral Given 09/26/23 11-Oct-2115)     IMPRESSION / MDM / ASSESSMENT AND PLAN / ED COURSE                                Differential diagnosis includes, but is not limited to organic psychiatric disorder, substance use, anemia, electrolyte derangement   ED course: Patient well-appearing and has no focal neurological deficits.  Does not seem to be under the effect of any toxidrome at this time.  He is not profoundly anemic and has no leukocytosis.  His potassium  will be repleted but no other profound electrolyte derangement's.  At this time I do think he is medically cleared for psychiatric assessment -- Risk: 5 This patient has a high risk of morbidity due to further diagnostic testing or treatment. Rationale: This patient's evaluation and management involve a high risk of morbidity due to the potential severity of presenting symptoms, need for diagnostic testing, and/or initiation of treatment that may require close monitoring. The differential includes conditions with potential for significant deterioration or requiring escalation of care. Treatment decisions in the ED, including medication administration, procedural interventions, or disposition planning, reflect this level of risk. Additional Support: -- Drug therapy requiring intensive monitoring for toxicity [ ]  -- Decision regarding elective major surgery with idenitified patient or procedure risk factors [ ]  -- Decision regarding hospitalization or escalation of hospital-level care [ ]  -- Decision not to resuscitate or to de-escalate care because of poor prognosis [ ]  -- Parental controlled substances [ ]   COPA: 5 The patient has a severe exacerbation, progression, or side effect of treatment of the following illness/illnesses: []  OR  The patient has the following acute or chronic illness/injury that poses a possible threat to life or bodily function: [X] : The patient has a potentially serious acute condition or an acute exacerbation of a chronic illness requiring urgent evaluation and management in the Emergency Department. The clinical presentation necessitates immediate consideration of life-threatening or function-threatening diagnoses, even if they are ultimately ruled out.  Data(2/3 categories following were performed): 5 I reviewed or ordered at least three unique tests, external notes, and/or the history required an independent historian as one of the three requirements as following: CBC,  BMP, ethanol AND  I independently interpreted the following test: []  OR  I discussed the management of the patient with the following external physician or qualified healthcare provider: Psychiatric provider  Suggested E/M Coding Level: 5, 99285, This has been selected based on the October 10, 2021 CPT guidelines for E/M codes in the Emergency Department based on 2/3 of the CoPA, Data, and Risk.       FINAL CLINICAL IMPRESSION(S) / ED DIAGNOSES   Final diagnoses:  Suicidal ideation  Insomnia, unspecified type     Rx / DC Orders   ED Discharge Orders     None        Note:  This document was prepared using Dragon voice recognition software and may include unintentional dictation errors.   Nicholaus Rolland BRAVO, MD 09/26/23 Oct 10, 2228

## 2023-09-26 NOTE — ED Triage Notes (Signed)
 Pt. In via pov from home, c/o suicidal thoughts, states he had his meds adjusted recently and they have made him feel worse, states he would overdose on heroin

## 2023-09-27 ENCOUNTER — Inpatient Hospital Stay
Admission: AD | Admit: 2023-09-27 | Discharge: 2023-10-03 | DRG: 885 | Disposition: A | Discharge status: Home / Self Care | Payer: 59 | Source: Intra-hospital | Attending: Psychiatry | Admitting: Psychiatry

## 2023-09-27 ENCOUNTER — Encounter: Payer: Self-pay | Admitting: Psychiatry

## 2023-09-27 DIAGNOSIS — Z818 Family history of other mental and behavioral disorders: Secondary | ICD-10-CM

## 2023-09-27 DIAGNOSIS — Z91148 Patient's other noncompliance with medication regimen for other reason: Secondary | ICD-10-CM | POA: Diagnosis not present

## 2023-09-27 DIAGNOSIS — R45851 Suicidal ideations: Secondary | ICD-10-CM | POA: Diagnosis present

## 2023-09-27 DIAGNOSIS — F259 Schizoaffective disorder, unspecified: Secondary | ICD-10-CM | POA: Diagnosis present

## 2023-09-27 DIAGNOSIS — F419 Anxiety disorder, unspecified: Secondary | ICD-10-CM | POA: Diagnosis present

## 2023-09-27 DIAGNOSIS — F17210 Nicotine dependence, cigarettes, uncomplicated: Secondary | ICD-10-CM | POA: Diagnosis present

## 2023-09-27 DIAGNOSIS — F1510 Other stimulant abuse, uncomplicated: Secondary | ICD-10-CM | POA: Diagnosis present

## 2023-09-27 DIAGNOSIS — F251 Schizoaffective disorder, depressive type: Secondary | ICD-10-CM | POA: Diagnosis not present

## 2023-09-27 DIAGNOSIS — F319 Bipolar disorder, unspecified: Secondary | ICD-10-CM | POA: Diagnosis present

## 2023-09-27 DIAGNOSIS — G4700 Insomnia, unspecified: Secondary | ICD-10-CM | POA: Diagnosis present

## 2023-09-27 LAB — URINE DRUG SCREEN, QUALITATIVE (ARMC ONLY)
Amphetamines, Ur Screen: NOT DETECTED
Barbiturates, Ur Screen: NOT DETECTED
Benzodiazepine, Ur Scrn: POSITIVE — AB
Cannabinoid 50 Ng, Ur ~~LOC~~: NOT DETECTED
Cocaine Metabolite,Ur ~~LOC~~: NOT DETECTED
MDMA (Ecstasy)Ur Screen: NOT DETECTED
Methadone Scn, Ur: NOT DETECTED
Opiate, Ur Screen: NOT DETECTED
Phencyclidine (PCP) Ur S: NOT DETECTED
Tricyclic, Ur Screen: NOT DETECTED

## 2023-09-27 MED ORDER — DIPHENHYDRAMINE HCL 50 MG/ML IJ SOLN: 50.0000 mg | Freq: Three times a day (TID) | INTRAMUSCULAR | Status: DC | PRN

## 2023-09-27 MED ORDER — HALOPERIDOL LACTATE 5 MG/ML IJ SOLN: 5.0000 mg | Freq: Three times a day (TID) | INTRAMUSCULAR | Status: DC | PRN

## 2023-09-27 MED ORDER — LORAZEPAM 2 MG/ML IJ SOLN
2.0000 mg | Freq: Three times a day (TID) | INTRAMUSCULAR | Status: DC | PRN
Start: 2023-09-27 — End: 2023-10-03

## 2023-09-27 MED ORDER — ALUM & MAG HYDROXIDE-SIMETH 200-200-20 MG/5ML PO SUSP
30.0000 mL | ORAL | Status: DC | PRN
Start: 2023-09-27 — End: 2023-10-03

## 2023-09-27 MED ORDER — HYDROXYZINE HCL 25 MG PO TABS: 50.0000 mg | ORAL_TABLET | Freq: Four times a day (QID) | ORAL | Status: DC | PRN

## 2023-09-27 MED ORDER — ACETAMINOPHEN 325 MG PO TABS: 650.0000 mg | ORAL_TABLET | Freq: Four times a day (QID) | ORAL | Status: DC | PRN

## 2023-09-27 MED ORDER — LORAZEPAM 2 MG/ML IJ SOLN: 2.0000 mg | Freq: Three times a day (TID) | INTRAMUSCULAR | Status: DC | PRN

## 2023-09-27 MED ORDER — FLUOXETINE HCL 10 MG PO CAPS
10.0000 mg | ORAL_CAPSULE | Freq: Every day | ORAL | Status: DC
  Filled 2023-09-27 (×2): qty 1

## 2023-09-27 MED ORDER — DIPHENHYDRAMINE HCL 25 MG PO CAPS: 50.0000 mg | ORAL_CAPSULE | Freq: Three times a day (TID) | ORAL | Status: DC | PRN

## 2023-09-27 MED ORDER — MAGNESIUM HYDROXIDE 400 MG/5ML PO SUSP: 30.0000 mL | Freq: Every day | ORAL | Status: DC | PRN

## 2023-09-27 MED ORDER — DIPHENHYDRAMINE HCL 50 MG/ML IJ SOLN: 50.0000 mg | Freq: Four times a day (QID) | INTRAMUSCULAR | Status: DC | PRN

## 2023-09-27 MED ORDER — DIAZEPAM 5 MG/ML IJ SOLN: 10.0000 mg | Freq: Four times a day (QID) | INTRAMUSCULAR | Status: DC | PRN

## 2023-09-27 MED ORDER — HYDROXYZINE HCL 25 MG PO TABS
25.0000 mg | ORAL_TABLET | Freq: Four times a day (QID) | ORAL | Status: DC | PRN
  Administered 2023-09-29: 25 mg via ORAL
  Filled 2023-09-27: qty 1

## 2023-09-27 MED ORDER — TRAZODONE HCL 50 MG PO TABS
50.0000 mg | ORAL_TABLET | Freq: Once | ORAL | Status: AC
  Administered 2023-09-27: 50 mg via ORAL
  Filled 2023-09-27: qty 1

## 2023-09-27 MED ORDER — HALOPERIDOL LACTATE 5 MG/ML IJ SOLN
10.0000 mg | Freq: Three times a day (TID) | INTRAMUSCULAR | Status: DC | PRN
Start: 2023-09-27 — End: 2023-10-03

## 2023-09-27 MED ORDER — OLANZAPINE 10 MG PO TABS
10.0000 mg | ORAL_TABLET | Freq: Two times a day (BID) | ORAL | Status: DC
  Administered 2023-09-27: 10 mg via ORAL
  Filled 2023-09-27: qty 1

## 2023-09-27 MED ORDER — HALOPERIDOL 5 MG PO TABS: 5.0000 mg | ORAL_TABLET | Freq: Three times a day (TID) | ORAL | Status: DC | PRN

## 2023-09-27 MED ORDER — DIPHENHYDRAMINE HCL 50 MG/ML IJ SOLN
50.0000 mg | Freq: Three times a day (TID) | INTRAMUSCULAR | Status: DC | PRN
Start: 2023-09-27 — End: 2023-10-03

## 2023-09-27 MED ORDER — OLANZAPINE 10 MG PO TABS
10.0000 mg | ORAL_TABLET | Freq: Two times a day (BID) | ORAL | Status: DC
  Administered 2023-09-27 – 2023-09-30 (×6): 10 mg via ORAL
  Filled 2023-09-27 (×6): qty 1

## 2023-09-27 MED ORDER — TRAZODONE HCL 50 MG PO TABS
50.0000 mg | ORAL_TABLET | Freq: Every evening | ORAL | Status: DC | PRN
Start: 2023-09-27 — End: 2023-09-28
  Administered 2023-09-27: 50 mg via ORAL
  Filled 2023-09-27 (×2): qty 1

## 2023-09-27 MED ORDER — ZIPRASIDONE MESYLATE 20 MG IM SOLR: 10.0000 mg | Freq: Four times a day (QID) | INTRAMUSCULAR | Status: DC | PRN

## 2023-09-27 NOTE — Progress Notes (Signed)
   09/27/23 2059  Psych Admission Type (Psych Patients Only)  Admission Status Voluntary  Psychosocial Assessment  Patient Complaints Anxiety;Insomnia;Sleep disturbance;Substance abuse  Eye Contact Brief  Facial Expression Anxious  Affect Anxious  Speech Soft  Interaction Minimal  Motor Activity Pacing  Appearance/Hygiene Body odor;In scrubs  Behavior Characteristics Cooperative;Anxious;Pacing  Mood Anxious  Thought Process  Coherency WDL  Content WDL  Delusions None reported or observed  Perception WDL  Hallucination None reported or observed  Judgment Impaired  Confusion WDL  Danger to Self  Current suicidal ideation? Denies  Self-Injurious Behavior No self-injurious ideation or behavior indicators observed or expressed   Agreement Not to Harm Self No  Danger to Others  Danger to Others None reported or observed   Mr. Nydam presents reporting inability to sleep for several days. He attributes this to recent medication changes. He recalls being previously on trazodone  but states it was discontinued and changed to another medication, which he is uncertain about. He denies current suicidal or homicidal ideation. He denies alcohol or substance use but admits to occasionally smoking cigarettes. He does not vape. Patient reports living with family and will return to the same residence after discharge. He is not currently employed or in school.

## 2023-09-27 NOTE — ED Notes (Signed)
Pt refused breakfast tray.  

## 2023-09-27 NOTE — ED Notes (Addendum)
 Pt awake and received dinner and asked to use the bathroom

## 2023-09-27 NOTE — ED Notes (Signed)
VOL/Consult ordered/Pending ?

## 2023-09-27 NOTE — ED Provider Notes (Signed)
 Emergency Medicine Observation Re-evaluation Note   BP 129/84   Pulse 79   Temp 98.7 F (37.1 C) (Oral)   Resp 17   Ht 6' (1.829 m)   Wt 80 kg   SpO2 98%   BMI 23.92 kg/m    ED Course / MDM   No reported events during my shift at the time of this note.   Pt is awaiting dispo from consultants   Ginnie Shams MD    Shams Ginnie, MD 09/27/23 [phone]

## 2023-09-27 NOTE — Group Note (Unsigned)
 Date:  09/27/2023 Time:  8:50 PM  Group Topic/Focus:  Wrap-Up Group:   The focus of this group is to help patients review their daily goal of treatment and discuss progress on daily workbooks.     Participation Level:  {BHH PARTICIPATION OZCZO:77735}  Participation Quality:  {BHH PARTICIPATION QUALITY:22265}  Affect:  {BHH AFFECT:22266}  Cognitive:  {BHH COGNITIVE:22267}  Insight: {BHH Insight2:20797}  Engagement in Group:  {BHH ENGAGEMENT IN HMNLE:77731}  Modes of Intervention:  {BHH MODES OF INTERVENTION:22269}  Additional Comments:  ***  Kerri Katz 09/27/2023, 8:50 PM

## 2023-09-27 NOTE — ED Notes (Signed)
 Pt doing tele visit with psych at this time

## 2023-09-27 NOTE — ED Provider Notes (Addendum)
 Per Dr. Drury, patient to be admitted to psychiatry on a voluntary basis at this time. Dr. Devonne final note to follow. However, per psych MD if patient should change mind and wants to go AMA, he meets IVC criteria and I would recommend that procedure be initiated.     Dicky Anes, MD 09/27/23 [phone]

## 2023-09-27 NOTE — ED Notes (Signed)
 Pt educated on the importance of keeping his potasium levels up but pt declined to take oral potasium that was prescribed.

## 2023-09-27 NOTE — Consult Note (Signed)
 Ethan Sutton Consult Note  Patient Name: Ethan Sutton MRN: [id number] DOB: Jan 04, 1986 DATE OF Consult: 09/27/2023  PRIMARY PSYCHIATRIC DIAGNOSES   1.  Schizoaffective Disorder, Depressed Type   RECOMMENDATIONS  Recommendations: Medication recommendations: Continue Zyprexa  10 mg bid for psychosis; Prozac  10 mg every day for depression/anxiety.  One time trazodone , 50 mg po for sleep (this AM);  PRN:  hydroxyzine , 50 mg q6h PRN anxiety; trazodone , 50 mg at bedtime PRN depression/anxiety/sleep; For severe agitation/aggression:  Geodon  10 mg IM q6h PRN; Valium  10 mg IM q6h PRN; Benadryl , 50 mg IM q6h PRN. Non-Medication/therapeutic recommendations: Patient still with suicidal thoughts, so continue with close observation until can be safely admitted to Psychiatry; Continue with matter-of-fact emotional support in ED, pending transfer Is inpatient psychiatric hospitalization recommended for this patient? Yes (Explain why): Patient continues with symptoms of psychotic depression and suicidal ideation, and he continues to meet IVC criteria for admission.  At this point, patient is willing to sign in voluntarily Is another care setting recommended for this patient? (examples may include Crisis Stabilization Unit, Residential/Recovery Treatment, ALF/SNF, Memory Care Unit)  No (Explain why): As above From a psychiatric perspective, is this patient appropriate for discharge to an outpatient setting/resource or other less restrictive environment for continued care?  No (Explain why): As above Follow-Up [facility] C/L services: We will sign off for now. Please re-consult our service if needed for any concerning changes in the patient's condition, discharge planning, or questions. Communication: Treatment team members (and family members if applicable) who were involved in treatment/care discussions and planning, and with whom we spoke or engaged with via secure text/chat, include the following:  Discussed case with Dr. Dicky, ED attending, who concurs with recommendations.  Thank you for involving us  in the care of this patient. If you have any additional questions or concerns, please call [phone] and ask for the provider on-call.   [facility] ATTESTATION & CONSENT   As the provider for this telehealth consult, I attest that I verified the patient's identity using two separate identifiers, introduced myself to the patient, provided my credentials, disclosed my location, and performed this encounter via a HIPAA-compliant, real-time, face-to-face, two-way, interactive audio and video platform and with the full consent and agreement of the patient (or guardian as applicable.)   Patient physical location: [facility] ED. Telehealth provider physical location: home office in state of [place] .  Video start time: 0710 EDT  Video end time: 0725 EDT  Total time spent in this encounter was 30 minutes, including record review, clinical interview, behavior observations, discussion of impressions and recommendations (including medications and hospitalization), and consultation/communication with relevant parties   IDENTIFYING DATA  Ethan Sutton is a 38 y.o. year-old male for whom a psychiatric consultation has been ordered by the primary provider. The patient was identified using two separate identifiers.  CHIEF COMPLAINT/REASON FOR CONSULT   I just can't take it anymore.   HISTORY OF PRESENT ILLNESS (HPI)  The patient presents with long history of schizoaffective disorder, coming in for sucidal ideation.  Patient recently hospitalized at [facility] for psychosis and suicidal thoughts, and was discharged on Prozac , Zyprexa , and trazodone . After only a few days, however, he stopped taking the meds because they weren't working.  Within a few days, however, his depressive sx's returned, and along with them intense thoughts of suicide that became essentially hallucinatory,  with critical voices that eventual urged him to kill himself. Patient was not sleeping, and he began to think of getting heroin to help  him sleep, but eve to kill himself.  Because of this, presented himself again for admission  No homicidal ideation.  .Denies drug/EtOH use.  BAL negative.  UDS pending.   PAST PSYCHIATRIC HISTORY   Otherwise as per HPI above.  PAST MEDICAL HISTORY  Past Medical History:  Diagnosis Date   Depression 02/09/2023   Schizophrenia [facility])      HOME MEDICATIONS  Facility Ordered Medications  Medication   [COMPLETED] diazepam  (VALIUM ) tablet 5 mg   melatonin tablet 5 mg   traZODone  (DESYREL ) tablet 50 mg   potassium chloride  SA (KLOR-CON  M) CR tablet 40 mEq   traZODone  (DESYREL ) tablet 50 mg   OLANZapine  (ZYPREXA ) tablet 10 mg   ziprasidone  (GEODON ) injection 10 mg   diazepam  (VALIUM ) injection 10 mg   diphenhydrAMINE  (BENADRYL ) injection 50 mg   hydrOXYzine  (ATARAX ) tablet 50 mg   PTA Medications  Medication Sig   melatonin 5 MG TABS Take 1 tablet (5 mg total) by mouth at bedtime.   traZODone  (DESYREL ) 50 MG tablet Take 1 tablet (50 mg total) by mouth at bedtime as needed for sleep.   FLUoxetine  (PROZAC ) 10 MG capsule Take 1 capsule (10 mg total) by mouth daily.   OLANZapine  (ZYPREXA ) 10 MG tablet Take 1 tablet (10 mg total) by mouth 2 (two) times daily.     ALLERGIES  No Known Allergies  SOCIAL & SUBSTANCE USE HISTORY  Social History   Socioeconomic History   Marital status: Single    Spouse name: Not on file   Number of children: Not on file   Years of education: Not on file   Highest education level: Not on file  Occupational History   Not on file  Tobacco Use   Smoking status: Some Days    Current packs/day: 0.00    Types: Cigarettes    Last attempt to quit: 2019    Years since quitting: 6.6   Smokeless tobacco: Never   Tobacco comments:    Pt declined nicotine  replacement 09/12/23  Vaping Use   Vaping status: Never Used  Substance  and Sexual Activity   Alcohol use: Not Currently    Comment: ive been in prison for a year i haven't used any   Drug use: Not Currently   Sexual activity: Not Currently    Birth control/protection: Condom, Abstinence  Other Topics Concern   Not on file  Social History Narrative   Not on file   Social Drivers of Health   Financial Resource Strain: Not on file  Food Insecurity: No Food Insecurity (09/12/2023)   Hunger Vital Sign    Worried About Running Out of Food in the Last Year: Never true    Ran Out of Food in the Last Year: Never true  Transportation Needs: No Transportation Needs (09/12/2023)   PRAPARE - Administrator, Civil Service (Medical): No    Lack of Transportation (Non-Medical): No  Physical Activity: Not on file  Stress: Not on file  Social Connections: Not on file   Social History   Tobacco Use  Smoking Status Some Days   Current packs/day: 0.00   Types: Cigarettes   Last attempt to quit: 2019   Years since quitting: 6.6  Smokeless Tobacco Never  Tobacco Comments   Pt declined nicotine  replacement 09/12/23   Social History   Substance and Sexual Activity  Alcohol Use Not Currently   Comment: ive been in prison for a year i haven't used any   Social History  Substance and Sexual Activity  Drug Use Not Currently    Additional pertinent information Lives with parents and a brother .  FAMILY HISTORY  History reviewed. No pertinent family history. Family Psychiatric History (if known):  Strong family history of schizophrenia (brother)  MENTAL STATUS EXAM (MSE)  Mental Status Exam: General Appearance: Fairly Groomed  Orientation:  Full (Time, Place, and Person)  Memory:  Immediate;   Fair Recent;   Fair Remote;   Fair  Concentration:  Concentration: Poor and Attention Span: Poor  Recall:  Fair  Attention  Poor  Eye Contact:  Minimal  Speech:  Slow  Language:  Good  Volume:  Decreased  Mood: I just can't care if I live  Affect:   Depressed and Flat  Thought Process:  Disorganized and Descriptions of Associations: Tangential  Thought Content:  Delusions, Hallucinations: Command:  auditory, and Obsessions  Suicidal Thoughts:  Yes.  with intent/plan  Homicidal Thoughts:  No  Judgement:  Poor  Insight:  Shallow  Psychomotor Activity:  Decreased  Akathisia:  No  Fund of Knowledge:  Fair    Assets:  Housing Social Support  Cognition:  WNL  ADL's:  Intact  AIMS (if indicated):       VITALS  Blood pressure 120/78, pulse 95, temperature 97.8 F (36.6 C), temperature source Oral, resp. rate 17, height 6' (1.829 m), weight 80 kg, SpO2 100%.  LABS  Admission on 09/26/2023  Component Date Value Ref Range Status   Sodium 09/26/2023 139  135 - 145 mmol/L Final   Potassium 09/26/2023 3.2 (L)  3.5 - 5.1 mmol/L Final   Chloride 09/26/2023 99  98 - 111 mmol/L Final   CO2 09/26/2023 28  22 - 32 mmol/L Final   Glucose, Bld 09/26/2023 86  70 - 99 mg/dL Final   Glucose reference range applies only to samples taken after fasting for at least 8 hours.   BUN 09/26/2023 6  6 - 20 mg/dL Final   Creatinine, Ser 09/26/2023 1.01  0.61 - 1.24 mg/dL Final   Calcium 91/70/7974 9.8  8.9 - 10.3 mg/dL Final   Total Protein 91/70/7974 8.8 (H)  6.5 - 8.1 g/dL Final   Albumin 91/70/7974 4.7  3.5 - 5.0 g/dL Final   AST 91/70/7974 31  15 - 41 U/L Final   ALT 09/26/2023 27  0 - 44 U/L Final   Alkaline Phosphatase 09/26/2023 82  38 - 126 U/L Final   Total Bilirubin 09/26/2023 1.1  0.0 - 1.2 mg/dL Final   GFR, Estimated 09/26/2023 >60  >60 mL/min Final   Comment: (NOTE) Calculated using the CKD-EPI Creatinine Equation (2021)    Anion gap 09/26/2023 12  5 - 15 Final   Performed at [facility], [address] [place], [place] [address]   Alcohol, Ethyl (B) 09/26/2023 <15  <15 mg/dL Final   Comment: (NOTE) For medical purposes only. Performed at [facility], [address] [place], [place] [address]    WBC  09/26/2023 9.5  4.0 - 10.5 K/uL Final   RBC 09/26/2023 5.70  4.22 - 5.81 MIL/uL Final   Hemoglobin 09/26/2023 17.6 (H)  13.0 - 17.0 g/dL Final   HCT 91/70/7974 50.8  39.0 - 52.0 % Final   MCV 09/26/2023 89.1  80.0 - 100.0 fL Final   MCH 09/26/2023 30.9  26.0 - 34.0 pg Final   MCHC 09/26/2023 34.6  30.0 - 36.0 g/dL Final   RDW 91/70/7974 12.7  11.5 - 15.5 % Final  Platelets 09/26/2023 330  150 - 400 K/uL Final   nRBC 09/26/2023 0.0  0.0 - 0.2 % Final   Performed at [facility], [address] [place], [place] [address]    PSYCHIATRIC REVIEW OF SYSTEMS (ROS)  ROS: Notable for the following relevant positive findings: Review of Systems  Constitutional: Negative.   HENT: Negative.    Eyes: Negative.   Respiratory: Negative.    Cardiovascular: Negative.   Gastrointestinal: Negative.   Musculoskeletal: Negative.   Skin: Negative.   Neurological: Negative.   Endo/Heme/Allergies: Negative.   Psychiatric/Behavioral:  Positive for depression, hallucinations and suicidal ideas. The patient is nervous/anxious.     Additional findings:      Musculoskeletal: No abnormal movements observed      Gait & Station: Normal      Pain Screening: Denies      Nutrition & Dental Concerns: Reviewed   RISK FORMULATION/ASSESSMENT  Is the patient experiencing any suicidal or homicidal ideations: Yes       Explain if yes:   Patient continues with suicidal ideation to overdose on heroin, with strong depressive sx's  Protective factors considered for safety management:   Patient without much community-based support  Risk factors/concerns considered for safety management:  Depression Access to lethal means Hopelessness Impulsivity Isolation Barriers to accessing treatment Male gender Unmarried  Is there a safety management plan with the patient and treatment team to minimize risk factors and promote protective factors: No           Explain: As above  Is crisis care placement or  psychiatric hospitalization recommended: Yes     Based on my current evaluation and risk assessment, patient is determined at this time to be at:  High risk  *RISK ASSESSMENT Risk assessment is a dynamic process; it is possible that this patient's condition, and risk level, may change. This should be re-evaluated and managed over time as appropriate. Please re-consult psychiatric consult services if additional assistance is needed in terms of risk assessment and management. If your team decides to discharge this patient, please advise the patient how to best access emergency psychiatric services, or to call 911, if their condition worsens or they feel unsafe in any way.   Adriana JINNY Pontes, MD [facility]

## 2023-09-27 NOTE — Plan of Care (Signed)
   Problem: Education: Goal: Knowledge of [facility] Education information/materials will improve Outcome: Progressing Goal: Emotional status will improve Outcome: Progressing Goal: Mental status will improve Outcome: Progressing Goal: Verbalization of understanding the information provided will improve Outcome: Progressing

## 2023-09-27 NOTE — ED Notes (Signed)
 Pt is set up in the interview room for psychiatrist video appt.

## 2023-09-27 NOTE — ED Notes (Signed)
Pt decline breakfast tray at this time.

## 2023-09-27 NOTE — ED Notes (Signed)
 This tech went in to get this pts vitals he was asleep and woke up when asked to get vitals after I got his vitals he went back to sleep

## 2023-09-27 NOTE — ED Notes (Signed)
 Patient's belongings bag will be taken to [facility]U and given to staff there.

## 2023-09-27 NOTE — Group Note (Signed)
 Date:  09/27/2023 Time:  9:01 PM  Group Topic/Focus:  Wrap-Up Group:   The focus of this group is to help patients review their daily goal of treatment and discuss progress on daily workbooks.    Participation Level:  Did Not Attend  Participation Quality:  none  Affect:  none  Cognitive:  none  Insight: None  Engagement in Group:  none  Modes of Intervention:  none  Additional Comments:  Wasn't on the unit at the time group was held.  Kerri Katz 09/27/2023, 9:01 PM

## 2023-09-28 DIAGNOSIS — F259 Schizoaffective disorder, unspecified: Principal | ICD-10-CM

## 2023-09-28 MED ORDER — TRAZODONE HCL 100 MG PO TABS
100.0000 mg | ORAL_TABLET | Freq: Every evening | ORAL | Status: DC | PRN
  Administered 2023-09-28 – 2023-10-02 (×5): 100 mg via ORAL
  Filled 2023-09-28 (×5): qty 1

## 2023-09-28 NOTE — Plan of Care (Signed)
  Problem: Activity: Goal: Interest or engagement in activities will improve 09/28/2023 1200 by Trudy Arland CROME, RN Outcome: Not Progressing 09/28/2023 1159 by Trudy Arland CROME, RN Outcome: Progressing   Problem: Activity: Goal: Sleeping patterns will improve 09/28/2023 1200 by Trudy Arland CROME, RN Outcome: Not Progressing 09/28/2023 1159 by Trudy Arland CROME, RN Outcome: Progressing

## 2023-09-28 NOTE — [facility] Suicide Risk Assessment (Signed)
 [facility] Admission Suicide Risk Assessment   Nursing information obtained from:  Patient Demographic factors:  Male, Adolescent or young adult, Low socioeconomic status, Unemployed Current Mental Status:  NA Loss Factors:  NA Historical Factors:  Impulsivity Risk Reduction Factors:  Living with another person, especially a relative, Positive social support  Total Time spent with patient: 1 hour Principal Problem: Schizoaffective disorder (HCC) Diagnosis:  Principal Problem:   Schizoaffective disorder (HCC)  Subjective Data:   38 year old male with a history schizoaffective disorder and amphetamine abuse presenting with suicidal ideation, auditory hallucinations, medication noncompliance, and functional decline. Symptoms are complicated by limited outpatient engagement, poor transportation resources, and reliance on family for support.  He was recently hospitalized at this facility and was discharged with follow-up appoint with [facility].  He was also referred to ACT does not appear that he followed along with those resources.  Given reported suicidal thoughts to ED with plan to overdose on heroin, recent hallucination, impaired judgment, poor medication adherence, and poor therapeutic relationship suicide risk is elevated.  Protective factors include supportive family, stable housing and willingness to seek care.The patient would benefit from inpatient psychiatric admission for stabilization, medication adjustment, and development of a more reliable follow-up plan.  We should rerefer patient to ACT team.   Continued Clinical Symptoms:    The Alcohol Use Disorders Identification Test, Guidelines for Use in Primary Care, Second Edition.  World Science writer [facility]). Score between 0-7:  no or low risk or alcohol related problems. Score between 8-15:  moderate risk of alcohol related problems. Score between 16-19:  high risk of alcohol related problems. Score 20 or above:  warrants further  diagnostic evaluation for alcohol dependence and treatment.   CLINICAL FACTORS:   Unstable or Poor Therapeutic Relationship Previous Psychiatric Diagnoses and Treatments   Musculoskeletal: Strength & Muscle Tone: within normal limits Gait & Station: normal Patient leans: N/A  Psychiatric Specialty Exam:  Presentation  General Appearance:  Casual  Eye Contact: Fair  Speech: Clear and Coherent  Speech Volume: Normal  Handedness: Right   Mood and Affect  Mood: Anxious  Affect: Congruent   Thought Process  Thought Processes: Disorganized  Descriptions of Associations:Circumstantial  Orientation:Full (Time, Place and Person)  Thought Content:Illogical  History of Schizophrenia/Schizoaffective disorder:Yes  Duration of Psychotic Symptoms:Greater than six months  Hallucinations:Hallucinations: Auditory  Ideas of Reference:None  Suicidal Thoughts:Suicidal Thoughts: No  Homicidal Thoughts:Homicidal Thoughts: No   Sensorium  Memory: Immediate Poor  Judgment: Impaired  Insight: Poor   Executive Functions  Concentration: Fair  Attention Span: Poor  Recall: Poor  Fund of Knowledge: Poor  Language: Fair   Psychomotor Activity  Psychomotor Activity: Psychomotor Activity: Normal   Assets  Assets: Communication Skills; Desire for Improvement; Housing   Sleep  Sleep: Sleep: Poor    Physical Exam: Physical Exam ROS Blood pressure 109/82, pulse 72, temperature (!) 97 F (36.1 C), temperature source Oral, resp. rate 20, height 6' (1.829 m), weight 89.8 kg, SpO2 99%. Body mass index is 26.85 kg/m.   COGNITIVE FEATURES THAT CONTRIBUTE TO RISK:  Thought constriction (tunnel vision)    SUICIDE RISK:   Mild:  Suicidal ideation of limited frequency, intensity, duration, and specificity.  There are no identifiable plans, no associated intent, mild dysphoria and related symptoms, good self-control (both objective and subjective  assessment), few other risk factors, and identifiable protective factors, including available and accessible social support.  PLAN OF CARE:  Safety and Monitoring:   --  Voluntary admission to inpatient psychiatric  unit for safety, stabilization and treatment -- Daily contact with patient to assess and evaluate symptoms and progress in treatment -- Patient's case to be discussed in multi-disciplinary team meeting -- Observation Level : q15 minute checks -- Vital signs:  q12 hours -- Precautions: suicide   I certify that inpatient services furnished can reasonably be expected to improve the patient's condition.   Donnice FORBES Right, PA-C 09/28/2023, 12:35 PM

## 2023-09-28 NOTE — H&P (Signed)
 Psychiatric Admission Assessment Adult  Patient Identification: Ethan Sutton MRN:  [id number] Date of Evaluation:  09/28/2023 Chief Complaint:  Schizoaffective disorder (HCC) [F25.9]   History of Present Illness: Patient is a 38 year old African American male with a past psychiatric history schizoaffective disorder and amphetamine abuse who is admitted following presentation to the emergency department requesting medication adjustment. He reports that his current regimen is no longer effective, stating, "My medicine wasn't working."  He is inconsistent reports noting that he stopped his medication following his recent discharge.  He had a recent admission at this facility for similar from 8/15-8/19.  To the ED provider he reported his depression symptoms returned after stopping the medication and that he had thoughts of suicide and auditory hallucinations telling him to harm himself.  Today on interview he is denying all of that he notes that he had vague auditory hallucinations.  He denies thoughts of harming himself and indicates he does not want to restart on an antidepressant medication.  He states that he was thinking about getting heroin to help him sleep because he has been unable to sleep since discontinuing his medications.  He denied homicidal ideation.  He denied substance use.  BAL was negative UDS was positive for benzodiazepines.  He indicates he did not go to his follow-up appointment with [facility].  Total Time spent with patient: 1 hour Sleep  Sleep:Sleep: Poor  Past Psychiatric History: Information collected from pt and chart review   Prev Dx/Sx: bipolar disorder, schizophrenia, schizoaffective, anxiety, depression Current Psych Provider: none Home Meds (current): zyprexa   Previous Med Trials: risperidone , pt uncertain  Therapy: none   Prior Psych Hospitalization: Multiple most recently 09/12/23 @ [facility] Prior Self Harm: Denies Prior Violence: Denies   Family Psych History:  Siblings with schizophrenia and aggression Family Hx suicide: None reported   Social History:  Developmental Hx:  Educational Hx: 11 th Occupational Hx: Unemployed Legal Hx: Denies Living Situation: with mother and father Spiritual Hx: Denies Access to weapons/lethal means: none    Substance History Alcohol: Occasional History of alcohol withdrawal seizures denies History of DT's denies  [place] Scale:  Flowsheet Row Admission (Current) from 09/27/2023 in [facility] INPATIENT BEH[facility] MEDICINE ED from 09/26/2023 in [facility] Emergency Department at [facility] Admission (Discharged) from 09/12/2023 in [facility] INPATIENT BEHAVIORAL MEDICINE  C-SSRS RISK CATEGORY Moderate Risk Moderate Risk No Risk     Past Medical History:  Past Medical History:  Diagnosis Date   Depression 02/09/2023   Schizophrenia [facility])     Past Surgical History:  Procedure Laterality Date   NO PAST SURGERIES     Family History: History reviewed. No pertinent family history.  Social History:  Social History   Substance and Sexual Activity  Alcohol Use Not Currently   Comment: ive been in prison for a year i haven't used any     Social History   Substance and Sexual Activity  Drug Use Not Currently      Allergies:  No Known Allergies Lab Results:  Results for orders placed or performed during the hospital encounter of 09/26/23 (from the past 48 hours)  Comprehensive metabolic panel     Status: Abnormal   Collection Time: 09/26/23  7:54 PM  Result Value Ref Range   Sodium 139 135 - 145 mmol/L   Potassium 3.2 (L) 3.5 - 5.1 mmol/L   Chloride 99 98 - 111 mmol/L   CO2 28 22 - 32 mmol/L   Glucose, Bld 86 70 - 99 mg/dL  Comment: Glucose reference range applies only to samples taken after fasting for at least 8 hours.   BUN 6 6 - 20 mg/dL   Creatinine, Ser 8.98 0.61 - 1.24 mg/dL   Calcium 9.8 8.9 - 89.6 mg/dL   Total Protein 8.8 (H) 6.5 - 8.1 g/dL   Albumin 4.7 3.5 - 5.0 g/dL   AST 31 15 - 41  U/L   ALT 27 0 - 44 U/L   Alkaline Phosphatase 82 38 - 126 U/L   Total Bilirubin 1.1 0.0 - 1.2 mg/dL   GFR, Estimated >39 >39 mL/min    Comment: (NOTE) Calculated using the CKD-EPI Creatinine Equation (2021)    Anion gap 12 5 - 15    Comment: Performed at [facility], [address] [place], [place] [address]  Ethanol     Status: None   Collection Time: 09/26/23  7:54 PM  Result Value Ref Range   Alcohol, Ethyl (B) <15 <15 mg/dL    Comment: (NOTE) For medical purposes only. Performed at [facility], [address] [place], [place] [address]   cbc     Status: Abnormal   Collection Time: 09/26/23  7:54 PM  Result Value Ref Range   WBC 9.5 4.0 - 10.5 K/uL   RBC 5.70 4.22 - 5.81 MIL/uL   Hemoglobin 17.6 (H) 13.0 - 17.0 g/dL   HCT 49.1 60.9 - 47.9 %   MCV 89.1 80.0 - 100.0 fL   MCH 30.9 26.0 - 34.0 pg   MCHC 34.6 30.0 - 36.0 g/dL   RDW 87.2 88.4 - 84.4 %   Platelets 330 150 - 400 K/uL   nRBC 0.0 0.0 - 0.2 %    Comment: Performed at [facility], [address], [place], [place] [address]  Urine Drug Screen, Qualitative     Status: Abnormal   Collection Time: 09/27/23  2:25 PM  Result Value Ref Range   Tricyclic, Ur Screen NONE DETECTED NONE DETECTED   Amphetamines, Ur Screen NONE DETECTED NONE DETECTED   MDMA (Ecstasy)Ur Screen NONE DETECTED NONE DETECTED   Cocaine Metabolite,Ur [place] NONE DETECTED NONE DETECTED   Opiate, Ur Screen NONE DETECTED NONE DETECTED   Phencyclidine (PCP) Ur S NONE DETECTED NONE DETECTED   Cannabinoid 50 Ng, Ur  NONE DETECTED NONE DETECTED   Barbiturates, Ur Screen NONE DETECTED NONE DETECTED   Benzodiazepine, Ur Scrn POSITIVE (A) NONE DETECTED   Methadone Scn, Ur NONE DETECTED NONE DETECTED    Comment: (NOTE) Tricyclics + metabolites, urine    Cutoff 1000 ng/mL Amphetamines + metabolites, urine  Cutoff 1000 ng/mL MDMA (Ecstasy), urine              Cutoff 500 ng/mL Cocaine Metabolite, urine          Cutoff 300  ng/mL Opiate + metabolites, urine        Cutoff 300 ng/mL Phencyclidine (PCP), urine         Cutoff 25 ng/mL Cannabinoid, urine                 Cutoff 50 ng/mL Barbiturates + metabolites, urine  Cutoff 200 ng/mL Benzodiazepine, urine              Cutoff 200 ng/mL Methadone, urine                   Cutoff 300 ng/mL  The urine drug screen provides only a preliminary, unconfirmed analytical test result and should not be used for non-medical  purposes. Clinical consideration and professional judgment should be applied to any positive drug screen result due to possible interfering substances. A more specific alternate chemical method must be used in order to obtain a confirmed analytical result. Gas chromatography / mass spectrometry (GC/MS) is the preferred confirm atory method. Performed at [facility], [address] [place], [place] [address]     Blood Alcohol level:  Lab Results  Component Value Date   [facility] <15 09/26/2023   [facility] <15 09/12/2023    Metabolic Disorder Labs:  No results found for: HGBA1C, MPG No results found for: PROLACTIN No results found for: CHOL, TRIG, HDL, CHOLHDL, VLDL, LDLCALC  Current Medications: Current Facility-Administered Medications  Medication Dose Route Frequency Provider Last Rate Last Admin   acetaminophen  (TYLENOL ) tablet 650 mg  650 mg Oral Q6H PRN Jadapalle, Sree, MD       alum & mag hydroxide-simeth (MAALOX/MYLANTA) 200-200-20 MG/5ML suspension 30 mL  30 mL Oral Q4H PRN Jadapalle, Sree, MD       haloperidol  (HALDOL ) tablet 5 mg  5 mg Oral TID PRN Jadapalle, Sree, MD       And   diphenhydrAMINE  (BENADRYL ) capsule 50 mg  50 mg Oral TID PRN Jadapalle, Sree, MD       haloperidol  lactate (HALDOL ) injection 5 mg  5 mg Intramuscular TID PRN Jadapalle, Sree, MD       And   diphenhydrAMINE  (BENADRYL ) injection 50 mg  50 mg Intramuscular TID PRN Jadapalle, Sree, MD       And   LORazepam  (ATIVAN ) injection 2 mg  2 mg  Intramuscular TID PRN Jadapalle, Sree, MD       haloperidol  lactate (HALDOL ) injection 10 mg  10 mg Intramuscular TID PRN Jadapalle, Sree, MD       And   diphenhydrAMINE  (BENADRYL ) injection 50 mg  50 mg Intramuscular TID PRN Jadapalle, Sree, MD       And   LORazepam  (ATIVAN ) injection 2 mg  2 mg Intramuscular TID PRN Jadapalle, Sree, MD       hydrOXYzine  (ATARAX ) tablet 25 mg  25 mg Oral Q6H PRN Jadapalle, Sree, MD       magnesium  hydroxide (MILK OF MAGNESIA) suspension 30 mL  30 mL Oral Daily PRN Donnelly Mellow, MD       OLANZapine  (ZYPREXA ) tablet 10 mg  10 mg Oral BID Jadapalle, Sree, MD   10 mg at 09/28/23 9149   traZODone  (DESYREL ) tablet 100 mg  100 mg Oral QHS PRN Saki Legore E, PA-C       PTA Medications: Medications Prior to Admission  Medication Sig Dispense Refill Last Dose/Taking   FLUoxetine  (PROZAC ) 10 MG capsule Take 1 capsule (10 mg total) by mouth daily. 30 capsule 0    melatonin 5 MG TABS Take 1 tablet (5 mg total) by mouth at bedtime.      OLANZapine  (ZYPREXA ) 10 MG tablet Take 1 tablet (10 mg total) by mouth 2 (two) times daily. 60 tablet 0    traZODone  (DESYREL ) 50 MG tablet Take 1 tablet (50 mg total) by mouth at bedtime as needed for sleep. 30 tablet 0     Psychiatric Specialty Exam:  Presentation  General Appearance:  Casual  Eye Contact: Fair  Speech: Clear and Coherent  Speech Volume: Normal    Mood and Affect  Mood: Anxious  Affect: Congruent   Thought Process  Thought Processes: Disorganized  Descriptions of Associations:Circumstantial  Orientation:Full (Time, Place and Person)  Thought Content:Illogical  Hallucinations:Hallucinations: Auditory  Ideas of Reference:None  Suicidal Thoughts:Suicidal Thoughts: No  Homicidal Thoughts:Homicidal Thoughts: No   Sensorium  Memory: Immediate Poor  Judgment: Impaired  Insight: Poor   Executive Functions  Concentration: Fair  Attention  Span: Poor  Recall: Poor  Fund of Knowledge: Poor  Language: Fair   Psychomotor Activity  Psychomotor Activity:Psychomotor Activity: Normal   Assets  Assets: Communication Skills; Desire for Improvement; Housing    Musculoskeletal: Strength & Muscle Tone: within normal limits Gait & Station: normal  Physical Exam: Physical Exam Vitals and nursing note reviewed.  HENT:     Head: Atraumatic.  Eyes:     Extraocular Movements: Extraocular movements intact.  Pulmonary:     Effort: Pulmonary effort is normal.  Neurological:     Mental Status: He is alert and oriented to person, place, and time.    Review of Systems  Psychiatric/Behavioral:  Positive for depression, hallucinations and suicidal ideas. Negative for memory loss and substance abuse. The patient has insomnia. The patient is not nervous/anxious.    Blood pressure 109/82, pulse 72, temperature (!) 97 F (36.1 C), temperature source Oral, resp. rate 20, height 6' (1.829 m), weight 89.8 kg, SpO2 99%. Body mass index is 26.85 kg/m.  Principal Diagnosis: Schizoaffective disorder (HCC) Diagnosis:  Principal Problem:   Schizoaffective disorder (HCC)   Clinical Decision Making:  38 year old male with a history schizoaffective disorder and amphetamine abuse presenting with suicidal ideation, auditory hallucinations, medication noncompliance, and functional decline. Symptoms are complicated by limited outpatient engagement, poor transportation resources, and reliance on family for support.  He was recently hospitalized at this facility and was discharged with follow-up appoint with [facility].  He was also referred to ACT does not appear that he followed along with those resources.  Given reported suicidal thoughts to ED with plan to overdose on heroin, recent hallucination, impaired judgment, poor medication adherence, and poor therapeutic relationship suicide risk is elevated.  Protective factors include supportive  family, stable housing and willingness to seek care.The patient would benefit from inpatient psychiatric admission for stabilization, medication adjustment, and development of a more reliable follow-up plan.  We should rerefer patient to ACT team.  Treatment Plan Summary:  Safety and Monitoring:             -- Voluntary admission to inpatient psychiatric unit for safety, stabilization and treatment             -- Daily contact with patient to assess and evaluate symptoms and progress in treatment             -- Patient's case to be discussed in multi-disciplinary team meeting             -- Observation Level: q15 minute checks             -- Vital signs:  q12 hours             -- Precautions: suicide, elopement, and assault   2. Psychiatric Diagnoses and Treatment:            Schizoaffective disorder Restart Zyprexa  10 mg twice daily.  Restart trazodone  100 mg nightly as needed.  Patient is not agreeable with restarting antidepressant medications for depression and anxiety.  Will discontinue Prozac      -- The risks/benefits/side-effects/alternatives to this medication were discussed in detail with the patient and time was given for questions. The patient consents to medication trial.                --  Metabolic profile and EKG monitoring obtained while on an atypical antipsychotic (BMI: Lipid Panel: HbgA1c: QTc:)              -- Encouraged patient to participate in unit milieu and in scheduled group therapies                            3. Medical Issues Being Addressed:  No acute concerns   4. Discharge Planning:              -- Social work and case management to assist with discharge planning and identification of hospital follow-up needs prior to discharge             -- Estimated LOS: 5-7 days             -- Discharge Concerns: Need to establish a safety plan; Medication compliance and effectiveness             -- Discharge Goals: Return home with outpatient referrals follow  ups  Physician Treatment Plan for Primary Diagnosis: Schizoaffective disorder (HCC) Long Term Goal(s): Improvement in symptoms so as ready for discharge  Short Term Goals: Compliance with prescribed medications will improve   I certify that inpatient services furnished can reasonably be expected to improve the patient's condition.    Donnice FORBES Right, PA-C 8/31/202512:31 PM

## 2023-09-28 NOTE — [facility] Group Notes (Signed)
 [facility] Group Notes:  (Nursing/MHT/Case Management/Adjunct)  Date:  09/28/2023  Time:  2000  Type of Therapy:  Wrap up group  Participation Level:  Minimal  Participation Quality:  Attentive  Affect:  Anxious, Depressed, and Resistant  Cognitive:  Lacking  Insight:  Limited  Engagement in Group:  Limited  Modes of Intervention:  Clarification, Education, and Support  Summary of Progress/Problems: Positive thinking and positive change were discussed.   Lenora Manuelita RAMAN 09/28/2023, 8:34 PM

## 2023-09-28 NOTE — Progress Notes (Signed)
   09/28/23 2100  Psychosocial Assessment  Eye Contact Brief  Facial Expression Anxious  Affect Anxious  Speech Soft  Interaction Isolative  Motor Activity Slow  Appearance/Hygiene In scrubs  Thought Process  Coherency WDL  Content WDL  Delusions None reported or observed  Perception WDL  Hallucination None reported or observed  Judgment Impaired  Confusion WDL  Danger to Self  Current suicidal ideation? Denies  Danger to Others  Danger to Others None reported or observed

## 2023-09-28 NOTE — Group Note (Signed)
 Date:  09/28/2023 Time:  11:29 AM  Group Topic/Focus:  Wellness Toolbox:   The focus of this group is to discuss various aspects of wellness, balancing those aspects and exploring ways to increase the ability to experience wellness.  Patients will create a wellness toolbox for use upon discharge.    Participation Level:  Did Not Attend   Deitra Clap [facility] 09/28/2023, 11:29 AM

## 2023-09-28 NOTE — [facility] Suicide Risk Assessment (Signed)
 [facility] INPATIENT:  Family/Significant Other Suicide Prevention Education  Suicide Prevention Education:  Patient Refusal for Family/Significant Other Suicide Prevention Education: The patient Ethan Sutton has refused to provide written consent for family/significant other to be provided Family/Significant Other Suicide Prevention Education during admission and/or prior to discharge.  Physician notified.  SPE completed with pt, as pt refused to consent to family contact. SPI pamphlet provided to pt and pt was encouraged to share information with support network, ask questions, and talk about any concerns relating to SPE. Pt denies access to guns/firearms and verbalized understanding of information provided. Mobile Crisis information also provided to pt.    Micheil Klaus M Berdell Hostetler 09/28/2023, 11:47 AM

## 2023-09-28 NOTE — B[facility] Counselor (Signed)
 Adult Comprehensive Assessment  Patient ID: Ethan Sutton, male   DOB: 1985-05-16, 38 y.o.   MRN: [id number]  Information Source: Information source: Patient  Current Stressors:  Patient states their primary concerns and needs for treatment are:: They changed my meds and I'm trying to get them back right. Patient states their goals for this hospitilization and ongoing recovery are:: Get my medications right and feel better. Educational / Learning stressors: Patient denied. Employment / Job issues: Patient denied. Family Relationships: Patient denied. Financial / Lack of resources (include bankruptcy): Patient denied. Housing / Lack of housing: Patient denied. Physical health (include injuries & life threatening diseases): Patient denied. Social relationships: Patient denied. Substance abuse: Patient denied. Bereavement / Loss: Patient denied.  Living/Environment/Situation:  Living Arrangements: Parent Living conditions (as described by patient or guardian): WNL Who else lives in the home?: Patient reported that he lives with his mother, father and brother. How long has patient lived in current situation?: My whole life. What is atmosphere in current home: Comfortable  Family History:  Marital status: Single Are you sexually active?: Yes What is your sexual orientation?: Heterosexual. Has your sexual activity been affected by drugs, alcohol, medication, or emotional stress?: Patient denied. Does patient have children?: Yes How many children?: 3 How is patient's relationship with their children?: Ok.  Childhood History:  By whom was/is the patient raised?: Both parents Description of patient's relationship with caregiver when they were a child: It was ok. Patient's description of current relationship with people who raised him/her: Same. How were you disciplined when you got in trouble as a child/adolescent?: Spanked. Does patient have siblings?: Yes Number of  Siblings: 3 Description of patient's current relationship with siblings: Good. Did patient suffer any verbal/emotional/physical/sexual abuse as a child?: No Did patient suffer from severe childhood neglect?: No Has patient ever been sexually abused/assaulted/raped as an adolescent or adult?: No Was the patient ever a victim of a crime or a disaster?: No Witnessed domestic violence?: No Has patient been affected by domestic violence as an adult?: No  Education:  Highest grade of school patient has completed: 11th. Currently a student?: No Learning disability?: No  Employment/Work Situation:   Employment Situation: Unemployed Patient's Job has Been Impacted by Current Illness: No What is the Longest Time Patient has Held a Job?: 8 years Where was the Patient Employed at that Time?: [employer]. Has Patient ever Been in the [employer]?: No  Financial Resources:   Financial resources: Support from parents / caregiver Does patient have a Lawyer or guardian?: No  Alcohol/Substance Abuse:   What has been your use of drugs/alcohol within the last 12 months?: Patient denied. If attempted suicide, did drugs/alcohol play a role in this?: No Alcohol/Substance Abuse Treatment Hx: Denies past history Has alcohol/substance abuse ever caused legal problems?: No  Social Support System:   Patient's Community Support System: Good Describe Community Support System: My parents and siblings. Type of faith/religion: Not really. How does patient's faith help to cope with current illness?: Patient denied.  Leisure/Recreation:   Do You Have Hobbies?: No  Strengths/Needs:   What is the patient's perception of their strengths?: Patient denied. Patient states they can use these personal strengths during their treatment to contribute to their recovery: Patient denied. Patient states these barriers may affect/interfere with their treatment: None reported. Patient states these barriers  may affect their return to the community: None reported. Other important information patient would like considered in planning for their treatment: Patient would like a therpay and  psychiatry referral.  Discharge Plan:   Currently receiving community mental health services: No Patient states concerns and preferences for aftercare planning are: Patient would like a therpay and psychiatry referral. Patient states they will know when they are safe and ready for discharge when: When I get my medications good. Does patient have access to transportation?: No Does patient have financial barriers related to discharge medications?: No Patient description of barriers related to discharge medications: None reported. Plan for no access to transportation at discharge: CSW to arrange transportation on patient's behalf at discharge if needed. Will patient be returning to same living situation after discharge?: Yes  Summary/Recommendations:   Summary and Recommendations (to be completed by the evaluator): Patient is a 38 year old male from [place], [place] [facility]) who presented to the ER requesting that his medications be adjusted according to notes. During assessment with this Clinical research associate, patient reported They changed my meds and I'm trying to get them back right. Patient denied all stressors and reported that his primary goal is Get my medications right and feel better. Patient currently resides with his mother, father and brother and described the atmosphere as "comfortable." Patient is currently unemployed and reported receiving support from his parents. Patient denied substance use and reported receiving adequate support from My parents and siblings. Patient is not currently followed by a therapist or psychiatrist but would like a referral prior to discharge. Patient denied SI, HI an AVH. Patient's current diagnosis is Schizoaffective disorder (HCC). Recommendations include crisis stabilization,  therapeutic milieu, encourage group attendance and participation, medication management for mood stabilization, and development of a comprehensive mental wellness/sobriety plan.  Ethan Sutton. 09/28/2023

## 2023-09-28 NOTE — Group Note (Signed)
 Date:  09/28/2023 Time:  5:37 PM  Group Topic/Focus:  Healthy Communication:   The focus of this group is to discuss communication, barriers to communication, as well as healthy ways to communicate with others.    Participation Level:  Did Not Attend   Deitra Clap [facility] 09/28/2023, 5:37 PM

## 2023-09-28 NOTE — Progress Notes (Signed)
   09/28/23 1600  Psychosocial Assessment  Patient Complaints Sleep disturbance  Eye Contact Brief  Facial Expression Anxious  Affect Anxious  Speech Soft  Interaction Isolative  Motor Activity Slow  Appearance/Hygiene In scrubs  Behavior Characteristics Cooperative  Mood Anxious  Thought Process  Coherency WDL  Content WDL  Delusions None reported or observed  Perception WDL  Hallucination None reported or observed  Judgment Impaired  Confusion WDL  Danger to Self  Current suicidal ideation? Denies  Danger to Others  Danger to Others None reported or observed

## 2023-09-29 NOTE — Plan of Care (Signed)

## 2023-09-29 NOTE — [facility] Group Notes (Signed)
 [facility] Group Notes:  (Nursing/MHT/Case Management/Adjunct)  Date:  09/29/2023  Time:  10:09 PM  Type of Therapy:  Psychoeducational Skills  Participation Level:  Active  Participation Quality:  Appropriate  Affect:  Flat  Cognitive:  Appropriate  Insight:  Limited  Engagement in Group:  Developing/Improving  Modes of Intervention:  Education  Summary of Progress/Problems: The patient rated his day as a 5 out of a possible 10. He states that he had not too bad of a day. No details were offered. His positive event for the day is that he got along with his peers.   Ethan Sutton S 09/29/2023, 10:09 PM

## 2023-09-29 NOTE — Group Note (Signed)
 Date:  09/29/2023 Time:  10:32 AM  Group Topic/Focus:  Emotional Education:   The focus of this group is to discuss what feelings/emotions are, and how they are experienced.    Participation Level:  Did Not Attend  Participation Quality:    Affect:    Cognitive:    Insight:   Engagement in Group:    Modes of Intervention:    Additional Comments:    Prentiss Polio 09/29/2023, 10:32 AM

## 2023-09-29 NOTE — Group Note (Signed)
 Type of Therapy and Topic: Group Therapy: Values and Support Systems    Participation Level: Did Not Attend  Description of Group: This group addressed what support systems look like for patients. Group also consisted of identifying values held by the different systems in people's life. Systems to include: society, friends, family, and self. Patients went around the room and identified values they feel that each system holds. Patients reflected on why they felt the different system valued the different values discussed. Patients were encouraged to think daily about the values they hold and how they can implement them in their daily lives.  Therapeutic Goals: Patients will identify different values Patients will demonstrate empathy for others by providing active listening Patients will verbalize their feelings and thoughts on the values discussed Patients will provide support for group members for the values identified Summary of Patient Progress: X  Therapeutic Modalities: Cognitive Behavioral Therapy Motivational Interviewing  Sherryle JINNY Margo, LCSW 09/29/2023  3:05 PM

## 2023-09-29 NOTE — Plan of Care (Signed)
 ?  Problem: Health Behavior/Discharge Planning: ?Goal: Compliance with treatment plan for underlying cause of condition will improve ?Outcome: Progressing ?  ?

## 2023-09-29 NOTE — BH IP Treatment Plan (Addendum)
 Interdisciplinary Treatment and Diagnostic Plan Update  09/29/2023 Time of Session: 9:46AM Ethan Sutton MRN: [id number]  Principal Diagnosis: Schizoaffective disorder [facility])  Secondary Diagnoses: Principal Problem:   Schizoaffective disorder (HCC)   Current Medications:  Current Facility-Administered Medications  Medication Dose Route Frequency Provider Last Rate Last Admin   acetaminophen  (TYLENOL ) tablet 650 mg  650 mg Oral Q6H PRN Jadapalle, Sree, MD       alum & mag hydroxide-simeth (MAALOX/MYLANTA) 200-200-20 MG/5ML suspension 30 mL  30 mL Oral Q4H PRN Jadapalle, Sree, MD       haloperidol  (HALDOL ) tablet 5 mg  5 mg Oral TID PRN Jadapalle, Sree, MD       And   diphenhydrAMINE  (BENADRYL ) capsule 50 mg  50 mg Oral TID PRN Jadapalle, Sree, MD       haloperidol  lactate (HALDOL ) injection 5 mg  5 mg Intramuscular TID PRN Jadapalle, Sree, MD       And   diphenhydrAMINE  (BENADRYL ) injection 50 mg  50 mg Intramuscular TID PRN Jadapalle, Sree, MD       And   LORazepam  (ATIVAN ) injection 2 mg  2 mg Intramuscular TID PRN Jadapalle, Sree, MD       haloperidol  lactate (HALDOL ) injection 10 mg  10 mg Intramuscular TID PRN Jadapalle, Sree, MD       And   diphenhydrAMINE  (BENADRYL ) injection 50 mg  50 mg Intramuscular TID PRN Jadapalle, Sree, MD       And   LORazepam  (ATIVAN ) injection 2 mg  2 mg Intramuscular TID PRN Jadapalle, Sree, MD       hydrOXYzine  (ATARAX ) tablet 25 mg  25 mg Oral Q6H PRN Jadapalle, Sree, MD       magnesium  hydroxide (MILK OF MAGNESIA) suspension 30 mL  30 mL Oral Daily PRN Donnelly Mellow, MD       OLANZapine  (ZYPREXA ) tablet 10 mg  10 mg Oral BID Jadapalle, Sree, MD   10 mg at 09/29/23 9157   traZODone  (DESYREL ) tablet 100 mg  100 mg Oral QHS PRN Millington, Matthew E, PA-C   100 mg at 09/28/23 2115   PTA Medications: Medications Prior to Admission  Medication Sig Dispense Refill Last Dose/Taking   FLUoxetine  (PROZAC ) 10 MG capsule Take 1 capsule (10 mg total) by  mouth daily. 30 capsule 0    melatonin 5 MG TABS Take 1 tablet (5 mg total) by mouth at bedtime.      OLANZapine  (ZYPREXA ) 10 MG tablet Take 1 tablet (10 mg total) by mouth 2 (two) times daily. 60 tablet 0    traZODone  (DESYREL ) 50 MG tablet Take 1 tablet (50 mg total) by mouth at bedtime as needed for sleep. 30 tablet 0     Patient Stressors:    Patient Strengths:    Treatment Modalities: Medication Management, Group therapy, Case management,  1 to 1 session with clinician, Psychoeducation, Recreational therapy.   Physician Treatment Plan for Primary Diagnosis: Schizoaffective disorder (HCC) Long Term Goal(s): Improvement in symptoms so as ready for discharge   Short Term Goals: Compliance with prescribed medications will improve  Medication Management: Evaluate patient's response, side effects, and tolerance of medication regimen.  Therapeutic Interventions: 1 to 1 sessions, Unit Group sessions and Medication administration.  Evaluation of Outcomes: Not Met  Physician Treatment Plan for Secondary Diagnosis: Principal Problem:   Schizoaffective disorder (HCC)  Long Term Goal(s): Improvement in symptoms so as ready for discharge   Short Term Goals: Compliance with prescribed medications will improve  Medication Management: Evaluate patient's response, side effects, and tolerance of medication regimen.  Therapeutic Interventions: 1 to 1 sessions, Unit Group sessions and Medication administration.  Evaluation of Outcomes: Not Met   RN Treatment Plan for Primary Diagnosis: Schizoaffective disorder (HCC) Long Term Goal(s): Knowledge of disease and therapeutic regimen to maintain health will improve  Short Term Goals: Ability to verbalize frustration and anger appropriately will improve, Ability to demonstrate self-control, Ability to participate in decision making will improve, Ability to verbalize feelings will improve, Ability to disclose and discuss suicidal ideas, Ability  to identify and develop effective coping behaviors will improve, and Compliance with prescribed medications will improve  Medication Management: RN will administer medications as ordered by provider, will assess and evaluate patient's response and provide education to patient for prescribed medication. RN will report any adverse and/or side effects to prescribing provider.  Therapeutic Interventions: 1 on 1 counseling sessions, Psychoeducation, Medication administration, Evaluate responses to treatment, Monitor vital signs and CBGs as ordered, Perform/monitor CIWA, COWS, AIMS and Fall Risk screenings as ordered, Perform wound care treatments as ordered.  Evaluation of Outcomes: Not Met   LCSW Treatment Plan for Primary Diagnosis: Schizoaffective disorder (HCC) Long Term Goal(s): Safe transition to appropriate next level of care at discharge, Engage patient in therapeutic group addressing interpersonal concerns.  Short Term Goals: Engage patient in aftercare planning with referrals and resources, Increase social support, Increase ability to appropriately verbalize feelings, Increase emotional regulation, Facilitate acceptance of mental health diagnosis and concerns, and Increase skills for wellness and recovery  Therapeutic Interventions: Assess for all discharge needs, 1 to 1 time with Social worker, Explore available resources and support systems, Assess for adequacy in community support network, Educate family and significant other(s) on suicide prevention, Complete Psychosocial Assessment, Interpersonal group therapy.  Evaluation of Outcomes: Not Met   Progress in Treatment: Attending groups: No. Participating in groups: No. Taking medication as prescribed: Yes. Toleration medication: Yes. Family/Significant other contact made: No, will contact:  once permission has been granted Patient understands diagnosis: Yes. Discussing patient identified problems/goals with staff: Yes. Medical  problems stabilized or resolved: Yes. Denies suicidal/homicidal ideation: Yes. Issues/concerns per patient self-inventory: No. Other: none  New problem(s) identified: No, Describe:  none  New Short Term/Long Term Goal(s): elimination of symptoms of psychosis, medication management for mood stabilization; elimination of SI thoughts; development of comprehensive mental wellness/sobriety plan.   Patient Goals:  get my meds back right  Discharge Plan or Barriers: CSW to assist in the development of appropriate discharge plans.  Reason for Continuation of Hospitalization: Anxiety Depression Medication stabilization Suicidal ideation  Estimated Length of Stay:  1-7 days  Last 3 [place] Suicide Severity Risk Score: Flowsheet Row Admission (Current) from 09/27/2023 in [facility] INPATIENT BEHAVIORAL MEDICINE ED from 09/26/2023 in [facility] Emergency Department at [facility] Admission (Discharged) from 09/12/2023 in [facility] INPATIENT BEH[facility]IORAL MEDICINE  C-SSRS RISK CATEGORY Moderate Risk Moderate Risk No Risk    Last PHQ 2/9 Scores:    01/10/2023    2:37 PM  Depression screen PHQ 2/9  Decreased Interest 2  Down, Depressed, Hopeless 2  PHQ - 2 Score 4  Altered sleeping 2  Tired, decreased energy 1  Change in appetite 2  Feeling bad or failure about yourself  2  Trouble concentrating 1  Moving slowly or fidgety/restless 0  Suicidal thoughts 2  PHQ-9 Score 14  Difficult doing work/chores Somewhat difficult    Scribe for Treatment Team: Sherryle JINNY Margo, LCSW 09/29/2023 10:43 AM

## 2023-09-29 NOTE — Group Note (Signed)
 [facility] LCSW Group Therapy Note    Group Date: 09/29/2023 Start Time: 1300 End Time: 1400  Type of Therapy and Topic:  Group Therapy:  Overcoming Obstacles  Participation Level:  [facility] PARTICIPATION LEVEL: Did Not Attend  Mood:  Description of Group:   In this group patients will be encouraged to explore what they see as obstacles to their own wellness and recovery. They will be guided to discuss their thoughts, feelings, and behaviors related to these obstacles. The group will process together ways to cope with barriers, with attention given to specific choices patients can make. Each patient will be challenged to identify changes they are motivated to make in order to overcome their obstacles. This group will be process-oriented, with patients participating in exploration of their own experiences as well as giving and receiving support and challenge from other group members.  Therapeutic Goals: 1. Patient will identify personal and current obstacles as they relate to admission. 2. Patient will identify barriers that currently interfere with their wellness or overcoming obstacles.  3. Patient will identify feelings, thought process and behaviors related to these barriers. 4. Patient will identify two changes they are willing to make to overcome these obstacles:    Summary of Patient Progress   X   Therapeutic Modalities:   Cognitive Behavioral Therapy Solution Focused Therapy Motivational Interviewing Relapse Prevention Therapy   Sherryle JINNY Margo, LCSW

## 2023-09-29 NOTE — Group Note (Signed)
 Recreation Therapy Group Note   Group Topic:Stress Management  Group Date: 09/29/2023 Start Time: 1030 End Time: 1135 Facilitators: Celestia Jeoffrey BRAVO, LRT, CTRS Location: Courtyard  Group Description: [employer]. LRT and patients played games of basketball, drew with chalk, and played corn hole while outside in the courtyard while getting fresh air and sunlight. Music was being played in the background. LRT and peers conversed about different games they have played before, what they do in their free time and anything else that is on their minds. LRT encouraged pts to drink water after being outside, sweating and getting their heart rate up.  Goal Area(s) Addressed: Patient will build on frustration tolerance skills. Patients will partake in a competitive play game with peers. Patients will gain knowledge of new leisure interest/hobby.    Affect/Mood: N/A   Participation Level: Did not attend    Clinical Observations/Individualized Feedback: Patient did not attend group.   Plan: Continue to engage patient in RT group sessions 2-3x/week.   Jeoffrey BRAVO Celestia, LRT, CTRS 09/29/2023 1:46 PM

## 2023-09-29 NOTE — Progress Notes (Signed)
 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 He reports that his current regimen is no longer effective, stating, "My medicine wasn't working."  He is inconsistent reports noting that he stopped his medication following his recent discharge.  He had a recent admission at this facility for similar from 8/15-8/19.  To the ED provider he reported his depression symptoms returned after stopping the medication and that he had thoughts of suicide and auditory hallucinations telling him to harm himself.  Today on interview he is denying all of that he notes that he had vague auditory hallucinations.  He denies thoughts of harming himself and indicates he does not want to restart on an antidepressant medication.  He states that he was thinking about getting heroin to help him sleep because he has been unable to sleep since discontinuing his medications.  He denied homicidal ideation.  He denied substance use.  BAL was negative UDS was positive for benzodiazepines.  He indicates he did not go to his follow-up appointment with [facility].   Subjective:  Chart reviewed, case discussed in multidisciplinary meeting, patient seen during rounds.   Patient seen today for follow-up psychiatric evaluation. Reports returning to inpatient care because "someone changed my medicine." Endorses very poor sleep in the days prior to admission, which led to passive suicidal thoughts: "to go out and get drugs to not wake up." Currently rates depression at 7/10 and anxiety at 5/10. Reports that he was able to sleep last night and denies suicidal ideation, homicidal ideation, reports some continued auditory or visual hallucinations that are improving, denies paranoia, or delusions at this  time. Denies medication side effects; there are no abnormal movements or other symptoms of concern noted.  Sleep: Fair  Appetite:  Fair  Past Psychiatric History: see h&P Family History: History reviewed. No pertinent family history. Social History:  Social History   Substance and Sexual Activity  Alcohol Use Not Currently   Comment: ive been in prison for a year i haven't used any     Social History   Substance and Sexual Activity  Drug Use Not Currently    Social History   Socioeconomic History   Marital status: Single    Spouse name: Not on file   Number of children: Not on file   Years of education: Not on file   Highest education level: Not on file  Occupational History   Not on file  Tobacco Use   Smoking status: Some Days    Current packs/day: 0.00    Types: Cigarettes    Last attempt to quit: 2019    Years since quitting: 6.6   Smokeless tobacco: Never   Tobacco comments:    Pt declined nicotine  replacement 09/12/23  Vaping Use   Vaping status: Never Used  Substance and Sexual Activity   Alcohol use: Not Currently    Comment: ive been in prison for a year i haven't used any   Drug use: Not Currently   Sexual activity: Not Currently    Birth control/protection: Condom, Abstinence  Other Topics Concern   Not on file  Social History Narrative   Not on file   Social Drivers of Health   Financial Resource Strain: Not on file  Food Insecurity: Patient Declined (09/27/2023)   Hunger Vital Sign  Worried About Programme researcher, broadcasting/film/video in the Last Year: Patient declined    Barista in the Last Year: Patient declined  Transportation Needs: Patient Declined (09/27/2023)   PRAPARE - Administrator, Civil Service (Medical): Patient declined    Lack of Transportation (Non-Medical): Patient declined  Physical Activity: Not on file  Stress: Not on file  Social Connections: Unknown (09/27/2023)   Social Connection and Isolation Panel    Frequency of  Communication with Friends and Family: More than three times a week    Frequency of Social Gatherings with Friends and Family: More than three times a week    Attends Religious Services: Never    Database administrator or Organizations: Patient declined    Attends Banker Meetings: Patient declined    Marital Status: Never married   Past Medical History:  Past Medical History:  Diagnosis Date   Depression 02/09/2023   Schizophrenia (HCC)     Past Surgical History:  Procedure Laterality Date   NO PAST SURGERIES      Current Medications: Current Facility-Administered Medications  Medication Dose Route Frequency Provider Last Rate Last Admin   acetaminophen  (TYLENOL ) tablet 650 mg  650 mg Oral Q6H PRN Jadapalle, Sree, MD       alum & mag hydroxide-simeth (MAALOX/MYLANTA) 200-200-20 MG/5ML suspension 30 mL  30 mL Oral Q4H PRN Donnelly Mellow, MD       haloperidol  (HALDOL ) tablet 5 mg  5 mg Oral TID PRN Donnelly Mellow, MD       And   diphenhydrAMINE  (BENADRYL ) capsule 50 mg  50 mg Oral TID PRN Jadapalle, Sree, MD       haloperidol  lactate (HALDOL ) injection 5 mg  5 mg Intramuscular TID PRN Jadapalle, Sree, MD       And   diphenhydrAMINE  (BENADRYL ) injection 50 mg  50 mg Intramuscular TID PRN Donnelly Mellow, MD       And   LORazepam  (ATIVAN ) injection 2 mg  2 mg Intramuscular TID PRN Jadapalle, Sree, MD       haloperidol  lactate (HALDOL ) injection 10 mg  10 mg Intramuscular TID PRN Donnelly Mellow, MD       And   diphenhydrAMINE  (BENADRYL ) injection 50 mg  50 mg Intramuscular TID PRN Donnelly Mellow, MD       And   LORazepam  (ATIVAN ) injection 2 mg  2 mg Intramuscular TID PRN Jadapalle, Sree, MD       hydrOXYzine  (ATARAX ) tablet 25 mg  25 mg Oral Q6H PRN Donnelly Mellow, MD       magnesium  hydroxide (MILK OF MAGNESIA) suspension 30 mL  30 mL Oral Daily PRN Donnelly Mellow, MD       OLANZapine  (ZYPREXA ) tablet 10 mg  10 mg Oral BID Jadapalle, Sree, MD   10 mg at 09/29/23  9157   traZODone  (DESYREL ) tablet 100 mg  100 mg Oral QHS PRN Millington, Matthew E, PA-C   100 mg at 09/28/23 2115    Lab Results:  Results for orders placed or performed during the hospital encounter of 09/26/23 (from the past 48 hours)  Urine Drug Screen, Qualitative     Status: Abnormal   Collection Time: 09/27/23  2:25 PM  Result Value Ref Range   Tricyclic, Ur Screen NONE DETECTED NONE DETECTED   Amphetamines, Ur Screen NONE DETECTED NONE DETECTED   MDMA (Ecstasy)Ur Screen NONE DETECTED NONE DETECTED   Cocaine Metabolite,Ur [place] NONE DETECTED NONE DETECTED   Opiate,  Ur Screen NONE DETECTED NONE DETECTED   Phencyclidine (PCP) Ur S NONE DETECTED NONE DETECTED   Cannabinoid 50 Ng, Ur [place] NONE DETECTED NONE DETECTED   Barbiturates, Ur Screen NONE DETECTED NONE DETECTED   Benzodiazepine, Ur Scrn POSITIVE (A) NONE DETECTED   Methadone Scn, Ur NONE DETECTED NONE DETECTED    Comment: (NOTE) Tricyclics + metabolites, urine    Cutoff 1000 ng/mL Amphetamines + metabolites, urine  Cutoff 1000 ng/mL MDMA (Ecstasy), urine              Cutoff 500 ng/mL Cocaine Metabolite, urine          Cutoff 300 ng/mL Opiate + metabolites, urine        Cutoff 300 ng/mL Phencyclidine (PCP), urine         Cutoff 25 ng/mL Cannabinoid, urine                 Cutoff 50 ng/mL Barbiturates + metabolites, urine  Cutoff 200 ng/mL Benzodiazepine, urine              Cutoff 200 ng/mL Methadone, urine                   Cutoff 300 ng/mL  The urine drug screen provides only a preliminary, unconfirmed analytical test result and should not be used for non-medical purposes. Clinical consideration and professional judgment should be applied to any positive drug screen result due to possible interfering substances. A more specific alternate chemical method must be used in order to obtain a confirmed analytical result. Gas chromatography / mass spectrometry (GC/MS) is the preferred confirm atory method. Performed at [facility], [address] [place], [place] [address]     Blood Alcohol level:  Lab Results  Component Value Date   [facility] <15 09/26/2023   [facility] <15 09/12/2023    Metabolic Disorder Labs: No results found for: HGBA1C, MPG No results found for: PROLACTIN No results found for: CHOL, TRIG, HDL, CHOLHDL, VLDL, LDLCALC     Psychiatric Specialty Exam:  Presentation  General Appearance:  Casual  Eye Contact: Fair  Speech: Clear and Coherent  Speech Volume: Normal    Mood and Affect  Mood: Anxious  Affect: Congruent   Thought Process  Thought Processes: Disorganized  Descriptions of Associations:Circumstantial  Orientation:Full (Time, Place and Person)  Thought Content:Illogical  Hallucinations:Hallucinations: Auditory  Ideas of Reference:None  Suicidal Thoughts:Suicidal Thoughts: No  Homicidal Thoughts:Homicidal Thoughts: No   Sensorium  Memory: Immediate Poor  Judgment: Impaired  Insight: Poor   Executive Functions  Concentration: Fair  Attention Span: Poor  Recall: Poor  Fund of Knowledge: Poor  Language: Fair   Psychomotor Activity  Psychomotor Activity: Psychomotor Activity: Normal  Musculoskeletal: Strength & Muscle Tone: within normal limits Gait & Station: normal Assets  Assets: Manufacturing systems engineer; Desire for Improvement; Housing    Physical Exam: Physical Exam ROS Blood pressure 119/77, pulse 65, temperature (!) 97 F (36.1 C), temperature source Oral, resp. rate 20, height 6' (1.829 m), weight 89.8 kg, SpO2 99%. Body mass index is 26.85 kg/m.  Diagnosis: Principal Problem:   Schizoaffective disorder (HCC)   PLAN: Safety and Monitoring:  -- Voluntary admission to inpatient psychiatric unit for safety, stabilization and treatment  -- Daily contact with patient to assess and evaluate symptoms and progress in treatment  -- Patient's case to be discussed in multi-disciplinary team  meeting  -- Observation Level : q15 minute checks  -- Vital signs:  q12 hours  -- Precautions: suicide,  elopement, and assault -- Encouraged patient to participate in unit milieu and in scheduled group therapies  2. Psychiatric Diagnoses and Treatment:   38 year old male with a history schizoaffective disorder and amphetamine abuse presenting with suicidal ideation, auditory hallucinations, medication noncompliance, and functional decline. Symptoms are complicated by limited outpatient engagement, poor transportation resources, and reliance on family for support.  He was recently hospitalized at this facility and was discharged with follow-up appoint with [facility].  He was also referred to ACT does not appear that he followed along with those resources.  Given reported suicidal thoughts to ED with plan to overdose on heroin, recent hallucination, impaired judgment, poor medication adherence, and poor therapeutic relationship suicide risk is elevated.  Protective factors include supportive family, stable housing and willingness to seek care.The patient would benefit from inpatient psychiatric admission for stabilization, medication adjustment, and development of a more reliable follow-up plan.  We should rerefer patient to ACT team.    Schizoaffective disorder Restart Zyprexa  10 mg twice daily.  Restart trazodone  100 mg nightly as needed.  Patient is not agreeable with restarting antidepressant medications for depression and anxiety.  Will discontinue Prozac      3. Medical Issues Being Addressed: No acute concerns     4. Discharge Planning:   -- Social work and case management to assist with discharge planning and identification of hospital follow-up needs prior to discharge  -- Estimated LOS: 3-4 days  Hoy CHRISTELLA Pinal, NP 09/29/2023, 10:50 AM

## 2023-09-29 NOTE — Progress Notes (Signed)
   09/29/23 2000  Psychosocial Assessment  Eye Contact Brief  Facial Expression Flat  Affect Flat  Speech Soft  Interaction Isolative  Motor Activity Slow  Appearance/Hygiene In scrubs  Thought Process  Coherency WDL  Content WDL  Delusions None reported or observed  Perception WDL  Hallucination None reported or observed  Judgment Impaired  Confusion WDL  Danger to Self  Current suicidal ideation? Denies  Agreement Not to Harm Self Yes  Description of Agreement verbal  Danger to Others  Danger to Others None reported or observed

## 2023-09-29 NOTE — Progress Notes (Signed)
   09/29/23 1100  Psych Admission Type (Psych Patients Only)  Admission Status Voluntary  Psychosocial Assessment  Patient Complaints None  Eye Contact Brief  Facial Expression Flat  Affect Flat  Speech Soft  Interaction Isolative  Motor Activity Slow  Appearance/Hygiene In scrubs  Behavior Characteristics Cooperative  Mood Anxious  Thought Process  Coherency WDL  Content WDL  Delusions None reported or observed  Perception WDL  Hallucination None reported or observed  Judgment Impaired  Confusion WDL  Danger to Self  Current suicidal ideation? Denies  Agreement Not to Harm Self Yes  Description of Agreement verbal  Danger to Others  Danger to Others None reported or observed

## 2023-09-30 LAB — HEMOGLOBIN A1C
Hgb A1c MFr Bld: 5.4 % (ref 4.8–5.6)
Mean Plasma Glucose: 108 mg/dL

## 2023-09-30 LAB — LIPID PANEL
Cholesterol: 110 mg/dL (ref 0–200)
HDL: 40 mg/dL — ABNORMAL LOW (ref >40)
LDL Cholesterol: 61 mg/dL (ref 0–99)
Total CHOL/HDL Ratio: 2.8 ratio
Triglycerides: 43 mg/dL (ref <150)
VLDL: 9 mg/dL (ref 0–40)

## 2023-09-30 MED ORDER — OLANZAPINE 10 MG PO TABS
20.0000 mg | ORAL_TABLET | Freq: Every day | ORAL | Status: DC
  Administered 2023-09-30 – 2023-10-02 (×3): 20 mg via ORAL
  Filled 2023-09-30 (×3): qty 2

## 2023-09-30 NOTE — Progress Notes (Signed)
   09/30/23 0918  Psych Admission Type (Psych Patients Only)  Admission Status Voluntary  Psychosocial Assessment  Patient Complaints None  Eye Contact Brief  Facial Expression Flat  Affect Flat  Speech Soft  Interaction Isolative  Motor Activity Slow  Appearance/Hygiene In scrubs  Behavior Characteristics Cooperative  Mood Pleasant  Aggressive Behavior  Effect No apparent injury  Thought Process  Coherency WDL  Content WDL  Delusions None reported or observed  Perception WDL  Hallucination None reported or observed  Judgment Impaired  Confusion WDL  Danger to Self  Current suicidal ideation? Denies  Self-Injurious Behavior No self-injurious ideation or behavior indicators observed or expressed   Agreement Not to Harm Self Yes  Description of Agreement verbal  Danger to Others  Danger to Others None reported or observed

## 2023-09-30 NOTE — Progress Notes (Signed)
 Patient engaged sitting up in bed.   He denied SI/HI, depression, anxiety and AVH.    09/30/23 2100  Psych Admission Type (Psych Patients Only)  Admission Status Voluntary  Psychosocial Assessment  Patient Complaints None  Eye Contact Fair  Facial Expression Animated  Affect Flat  Speech Soft  Interaction Minimal;Isolative  Motor Activity Other (Comment) (WDL)  Appearance/Hygiene Unremarkable;In scrubs  Behavior Characteristics Cooperative;Appropriate to situation  Mood Pleasant  Aggressive Behavior  Effect No apparent injury  Thought Process  Coherency WDL  Content WDL  Delusions None reported or observed  Perception WDL  Hallucination None reported or observed  Judgment Limited  Confusion WDL  Danger to Self  Current suicidal ideation? Denies  Agreement Not to Harm Self Yes  Description of Agreement Verbal  Danger to Others  Danger to Others None reported or observed

## 2023-09-30 NOTE — Group Note (Signed)
 Date:  09/30/2023 Time:  10:38 AM  Group Topic/Focus:  Goals Group:   The focus of this group is to help patients establish daily goals to achieve during treatment and discuss how the patient can incorporate goal setting into their daily lives to aide in recovery.    Participation Level:  Did Not Attend  Skippy LITTIE Bennett 09/30/2023, 10:38 AM

## 2023-09-30 NOTE — Group Note (Signed)
 LCSW Group Therapy Note   Group Date: 09/30/2023 Start Time: 1300 End Time: 1403   Type of Therapy and Topic:  Group Therapy: Challenging Core Beliefs  Participation Level:  Did Not Attend  Description of Group:  Patients were educated about core beliefs and asked to identify one harmful core belief that they have. Patients were asked to explore from where those beliefs originate. Patients were asked to discuss how those beliefs make them feel and the resulting behaviors of those beliefs. They were then be asked if those beliefs are true and, if so, what evidence they have to support them. Lastly, group members were challenged to replace those negative core beliefs with helpful beliefs.   Therapeutic Goals:   1. Patient will identify harmful core beliefs and explore the origins of such beliefs. 2. Patient will identify feelings and behaviors that result from those core beliefs. 3. Patient will discuss whether such beliefs are true. 4.  Patient will replace harmful core beliefs with helpful ones.  Summary of Patient Progress:  Patient did not attend.   Therapeutic Modalities: Cognitive Behavioral Therapy; Solution-Focused Therapy   Ethan Sutton, LCSWA 09/30/2023  2:05 PM

## 2023-09-30 NOTE — Progress Notes (Signed)
 [facility] MD Progress Note  09/30/2023 10:56 AM Ethan Sutton  MRN:  [id number]  Patient is a 38 year old African American male with a past psychiatric history schizoaffective disorder and amphetamine abuse who is admitted following presentation to the emergency department requesting medication adjustment. He reports that his current regimen is no longer effective, stating, "My medicine wasn't working."  He is inconsistent reports noting that he stopped his medication following his recent discharge.  He had a recent admission at this facility for similar from 8/15-8/19.  To the ED provider he reported his depression symptoms returned after stopping the medication and that he had thoughts of suicide and auditory hallucinations telling him to harm himself.  Today on interview he is denying all of that he notes that he had vague auditory hallucinations.  He denies thoughts of harming himself and indicates he does not want to restart on an antidepressant medication.  He states that he was thinking about getting heroin to help him sleep because he has been unable to sleep since discontinuing his medications.  He denied homicidal ideation.  He denied substance use.  BAL was negative UDS was positive for benzodiazepines.  He indicates he did not go to his follow-up appointment with [facility]   Subjective:  Chart reviewed, case discussed in multidisciplinary meeting, patient seen during rounds.   Patient seen today in follow-up for psychiatric evaluation and medication management. He continues to report poor sleep contributing to suicidal thoughts, though he denies SI/HI today. He denies hallucinations, paranoia, or delusions. On further review, patient denies historical symptoms of depression. He denies any psychotic symptoms while incarcerated despite not being prescribed any psychotropics at that time. Prozac  has been discontinued in light of these findings. He reports alcohol use at night to assist with sleep, stating  previously that "beer helps me sleep at night." He was released from prison one month ago and is adjusting to community re-entry. Patient rates depression 5/10 and anxiety 3/10. Treatment plan remains to move Zyprexa  dosing to bedtime to improve sleep benefit, address pt report of hallucinations on admission and avoid daytime sedation. PRN trazodone  available for insomnia. Patient denies medication side effects. There are no abnormal movements or other symptoms of concern noted.   Sleep: Fair  Appetite:  good  Past Psychiatric History: see h&P Family History: History reviewed. No pertinent family history. Social History:  Social History   Substance and Sexual Activity  Alcohol Use Not Currently   Comment: ive been in prison for a year i haven't used any     Social History   Substance and Sexual Activity  Drug Use Not Currently    Social History   Socioeconomic History   Marital status: Single    Spouse name: Not on file   Number of children: Not on file   Years of education: Not on file   Highest education level: Not on file  Occupational History   Not on file  Tobacco Use   Smoking status: Some Days    Current packs/day: 0.00    Types: Cigarettes    Last attempt to quit: 2019    Years since quitting: 6.6   Smokeless tobacco: Never   Tobacco comments:    Pt declined nicotine  replacement 09/12/23  Vaping Use   Vaping status: Never Used  Substance and Sexual Activity   Alcohol use: Not Currently    Comment: ive been in prison for a year i haven't used any   Drug use: Not Currently   Sexual activity:  Not Currently    Birth control/protection: Condom, Abstinence  Other Topics Concern   Not on file  Social History Narrative   Not on file   Social Drivers of Health   Financial Resource Strain: Not on file  Food Insecurity: Patient Declined (09/27/2023)   Hunger Vital Sign    Worried About Running Out of Food in the Last Year: Patient declined    Ran Out of Food in  the Last Year: Patient declined  Transportation Needs: Patient Declined (09/27/2023)   PRAPARE - Administrator, Civil Service (Medical): Patient declined    Lack of Transportation (Non-Medical): Patient declined  Physical Activity: Not on file  Stress: Not on file  Social Connections: Unknown (09/27/2023)   Social Connection and Isolation Panel    Frequency of Communication with Friends and Family: More than three times a week    Frequency of Social Gatherings with Friends and Family: More than three times a week    Attends Religious Services: Never    Database administrator or Organizations: Patient declined    Attends Banker Meetings: Patient declined    Marital Status: Never married   Past Medical History:  Past Medical History:  Diagnosis Date   Depression 02/09/2023   Schizophrenia (HCC)     Past Surgical History:  Procedure Laterality Date   NO PAST SURGERIES      Current Medications: Current Facility-Administered Medications  Medication Dose Route Frequency Provider Last Rate Last Admin   acetaminophen  (TYLENOL ) tablet 650 mg  650 mg Oral Q6H PRN Donnelly Mellow, MD       alum & mag hydroxide-simeth (MAALOX/MYLANTA) 200-200-20 MG/5ML suspension 30 mL  30 mL Oral Q4H PRN Donnelly Mellow, MD       haloperidol  (HALDOL ) tablet 5 mg  5 mg Oral TID PRN Jadapalle, Sree, MD       And   diphenhydrAMINE  (BENADRYL ) capsule 50 mg  50 mg Oral TID PRN Jadapalle, Sree, MD       haloperidol  lactate (HALDOL ) injection 5 mg  5 mg Intramuscular TID PRN Jadapalle, Sree, MD       And   diphenhydrAMINE  (BENADRYL ) injection 50 mg  50 mg Intramuscular TID PRN Jadapalle, Sree, MD       And   LORazepam  (ATIVAN ) injection 2 mg  2 mg Intramuscular TID PRN Jadapalle, Sree, MD       haloperidol  lactate (HALDOL ) injection 10 mg  10 mg Intramuscular TID PRN Jadapalle, Sree, MD       And   diphenhydrAMINE  (BENADRYL ) injection 50 mg  50 mg Intramuscular TID PRN Jadapalle, Sree,  MD       And   LORazepam  (ATIVAN ) injection 2 mg  2 mg Intramuscular TID PRN Jadapalle, Sree, MD       hydrOXYzine  (ATARAX ) tablet 25 mg  25 mg Oral Q6H PRN Jadapalle, Sree, MD   25 mg at 09/29/23 2038   magnesium  hydroxide (MILK OF MAGNESIA) suspension 30 mL  30 mL Oral Daily PRN Donnelly Mellow, MD       OLANZapine  (ZYPREXA ) tablet 20 mg  20 mg Oral QHS Cleotilde Hoy HERO, NP       traZODone  (DESYREL ) tablet 100 mg  100 mg Oral QHS PRN Millington, Matthew E, PA-C   100 mg at 09/29/23 2038    Lab Results:  Results for orders placed or performed during the hospital encounter of 09/27/23 (from the past 48 hours)  Lipid panel  Status: Abnormal   Collection Time: 09/30/23  6:18 AM  Result Value Ref Range   Cholesterol 110 0 - 200 mg/dL   Triglycerides 43 <849 mg/dL   HDL 40 (L) >59 mg/dL   Total CHOL/HDL Ratio 2.8 RATIO   VLDL 9 0 - 40 mg/dL   LDL Cholesterol 61 0 - 99 mg/dL    Comment:        Total Cholesterol/HDL:CHD Risk Coronary Heart Disease Risk Table                     Men   Women  1/2 Average Risk   3.4   3.3  Average Risk       5.0   4.4  2 X Average Risk   9.6   7.1  3 X Average Risk  23.4   11.0        Use the calculated Patient Ratio above and the CHD Risk Table to determine the patient's CHD Risk.        ATP III CLASSIFICATION (LDL):  <100     mg/dL   Optimal  899-870  mg/dL   Near or Above                    Optimal  130-159  mg/dL   Borderline  839-810  mg/dL   High  >809     mg/dL   Very High Performed at [facility], [address] [place], [place] [address]     Blood Alcohol level:  Lab Results  Component Value Date   [facility] <15 09/26/2023   ETH <15 09/12/2023    Metabolic Disorder Labs: No results found for: HGBA1C, MPG No results found for: PROLACTIN Lab Results  Component Value Date   CHOL 110 09/30/2023   TRIG 43 09/30/2023   HDL 40 (L) 09/30/2023   CHOLHDL 2.8 09/30/2023   VLDL 9 09/30/2023   LDLCALC 61 09/30/2023        Psychiatric Specialty Exam: Appearance: Casually dressed, fair hygiene. Behavior: Cooperative, good eye contact. Speech: Clear, normal rate, rhythm, and volume. Mood: Relaxed. Affect: Congruent. Thought Process: Linear, goal-directed. Thought Content: Denies hallucinations, paranoia, or delusions. Denies SI/HI today. Perceptions: No AVH reported. Insight: Fair. Judgment: Fair. Cognition: Fully oriented; memory intact. Concentration/Attention/Recall/Fund of Knowledge/Language: Intact. Psychomotor: No agitation, retardation, or excitability. Impulse Control: Good.  Musculoskeletal: Strength & Muscle Tone: within normal limits Gait & Station: normal Assets  Assets: Manufacturing systems engineer; Desire for Improvement; Housing    Physical Exam: Physical Exam ROS Blood pressure 115/70, pulse 70, temperature 97.9 F (36.6 C), resp. rate 18, height 6' (1.829 m), weight 89.8 kg, SpO2 97%. Body mass index is 26.85 kg/m.  Diagnosis: Principal Problem:   Schizoaffective disorder (HCC)   PLAN: Safety and Monitoring:  -- Voluntary admission to inpatient psychiatric unit for safety, stabilization and treatment  -- Daily contact with patient to assess and evaluate symptoms and progress in treatment  -- Patient's case to be discussed in multi-disciplinary team meeting  -- Observation Level : q15 minute checks  -- Vital signs:  q12 hours  -- Precautions: suicide, elopement, and assault -- Encouraged patient to participate in unit milieu and in scheduled group therapies  2. Psychiatric Diagnoses and Treatment:   38 year old male with a history schizoaffective disorder and amphetamine abuse presenting with suicidal ideation, auditory hallucinations, medication noncompliance, and functional decline. Symptoms are complicated by limited outpatient engagement, poor transportation resources, and reliance on family for support.  He was recently hospitalized at this facility and was  discharged with follow-up appoint with [facility].  He was also referred to ACT does not appear that he followed along with those resources.  Given reported suicidal thoughts to ED with plan to overdose on heroin, recent hallucination, impaired judgment, poor medication adherence, and poor therapeutic relationship suicide risk is elevated.  Protective factors include supportive family, stable housing and willingness to seek care.The patient would benefit from inpatient psychiatric admission for stabilization, medication adjustment, and development of a more reliable follow-up plan.  We should rerefer patient to ACT team.   38 year old male with recent incarceration presenting primarily with insomnia and associated passive suicidal thoughts, though he denies SI/HI today. He denies historical depressive symptoms, and Prozac  has been discontinued. Diagnostic clarity remains under review -- concern for schizoaffective disorder or other psychotic disorder persists, but no diagnostic change at this time. Patient does not appear overtly psychotic. Continued inpatient care remains indicated for diagnostic clarification, sleep stabilization, and monitoring of safety. No self-harm, aggressive, or unsafe behaviors noted.   Zyprexa  20mg  po QHS    3. Medical Issues Being Addressed: No acute concerns     4. Discharge Planning:   -- Social work and case management to assist with discharge planning and identification of hospital follow-up needs prior to discharge  -- Estimated LOS: 3-4 days  Hoy CHRISTELLA Pinal, NP 09/30/2023, 10:56 AM

## 2023-09-30 NOTE — Group Note (Signed)
 Date:  09/30/2023 Time:  8:43 PM  Group Topic/Focus:  Self Care:   The focus of this group is to help patients understand the importance of self-care in order to improve or restore emotional, physical, spiritual, interpersonal, and financial health. Wrap-Up Group:   The focus of this group is to help patients review their daily goal of treatment and discuss progress on daily workbooks.    Participation Level:  Minimal  Participation Quality:  Appropriate  Affect:  Appropriate  Cognitive:  Alert and Appropriate  Insight: Appropriate and Good  Engagement in Group:  Engaged  Modes of Intervention:  Discussion  Additional Comments:     Ethan Sutton 09/30/2023, 8:43 PM

## 2023-09-30 NOTE — Group Note (Signed)
 Recreation Therapy Group Note   Group Topic:Healthy Support Systems  Group Date: 09/30/2023 Start Time: 1005 End Time: 1045 Facilitators: Celestia Jeoffrey BRAVO, LRT, CTRS Location: Craft Room  Group Description: Straw Bridge. In groups or individually, patients were given 10 plastic drinking straws and an equal length of masking tape. Using the materials provided, patients were instructed to build a free-standing bridge-like structure to suspend an everyday item (ex: deck of cards) off the floor or table surface. All materials were required to be used in Secondary school teacher. LRT facilitated post-activity discussion reviewing the importance of having strong and healthy support systems in our lives. LRT discussed how the people in our lives serve as the tape and the deck of cards we placed on top of our straw structure are the stressors we face in daily life. LRT and pts discussed what happens in our life when things get too heavy for us , and we don't have strong supports outside of the hospital. Pt shared 2 of their healthy supports in their life aloud in the group.   Goal Area(s) Addressed:  Patient will identify 2 healthy supports in their life. Patient will identify skills to successfully complete activity. Patient will identify correlation of this activity to life post-discharge.  Patient will build on frustration tolerance skills. Patient will increase team building and communication skills.    Affect/Mood: N/A   Participation Level: Did not attend    Clinical Observations/Individualized Feedback: Patient did not attend group.   Plan: Continue to engage patient in RT group sessions 2-3x/week.   Jeoffrey BRAVO Celestia, LRT, CTRS 09/30/2023 12:53 PM

## 2023-09-30 NOTE — Plan of Care (Signed)
  Problem: Education: Goal: Emotional status will improve Outcome: Progressing Goal: Mental status will improve Outcome: Progressing Goal: Verbalization of understanding the information provided will improve Outcome: Progressing   Problem: Coping: Goal: Ability to verbalize frustrations and anger appropriately will improve Outcome: Progressing Goal: Ability to demonstrate self-control will improve Outcome: Progressing   Problem: Health Behavior/Discharge Planning: Goal: Compliance with treatment plan for underlying cause of condition will improve Outcome: Progressing   Problem: Activity: Goal: Interest or engagement in activities will improve Outcome: Not Progressing Goal: Sleeping patterns will improve Outcome: Not Progressing Note: Sleeps most of the day

## 2023-09-30 NOTE — Plan of Care (Signed)
 Ethan Sutton is a 38 y.o. male patient. No diagnosis found. Past Medical History:  Diagnosis Date   Depression 02/09/2023   Schizophrenia (HCC)    Current Facility-Administered Medications  Medication Dose Route Frequency Provider Last Rate Last Admin   acetaminophen  (TYLENOL ) tablet 650 mg  650 mg Oral Q6H PRN Donnelly Mellow, MD       alum & mag hydroxide-simeth (MAALOX/MYLANTA) 200-200-20 MG/5ML suspension 30 mL  30 mL Oral Q4H PRN Jadapalle, Sree, MD       haloperidol  (HALDOL ) tablet 5 mg  5 mg Oral TID PRN Jadapalle, Sree, MD       And   diphenhydrAMINE  (BENADRYL ) capsule 50 mg  50 mg Oral TID PRN Jadapalle, Sree, MD       haloperidol  lactate (HALDOL ) injection 5 mg  5 mg Intramuscular TID PRN Jadapalle, Sree, MD       And   diphenhydrAMINE  (BENADRYL ) injection 50 mg  50 mg Intramuscular TID PRN Jadapalle, Sree, MD       And   LORazepam  (ATIVAN ) injection 2 mg  2 mg Intramuscular TID PRN Jadapalle, Sree, MD       haloperidol  lactate (HALDOL ) injection 10 mg  10 mg Intramuscular TID PRN Jadapalle, Sree, MD       And   diphenhydrAMINE  (BENADRYL ) injection 50 mg  50 mg Intramuscular TID PRN Jadapalle, Sree, MD       And   LORazepam  (ATIVAN ) injection 2 mg  2 mg Intramuscular TID PRN Jadapalle, Sree, MD       hydrOXYzine  (ATARAX ) tablet 25 mg  25 mg Oral Q6H PRN Jadapalle, Sree, MD   25 mg at 09/29/23 2038   magnesium  hydroxide (MILK OF MAGNESIA) suspension 30 mL  30 mL Oral Daily PRN Donnelly Mellow, MD       OLANZapine  (ZYPREXA ) tablet 10 mg  10 mg Oral BID Jadapalle, Sree, MD   10 mg at 09/29/23 1825   traZODone  (DESYREL ) tablet 100 mg  100 mg Oral QHS PRN Millington, Matthew E, PA-C   100 mg at 09/29/23 2038   No Known Allergies Principal Problem:   Schizoaffective disorder (HCC)  Blood pressure 119/77, pulse 65, temperature (!) 97 F (36.1 C), temperature source Oral, resp. rate 20, height 6' (1.829 m), weight 89.8 kg, SpO2 99%.  Subjective Objective Assessment &  Plan  Kayliee Atienza B Jett Kulzer 09/30/2023

## 2023-10-01 NOTE — [facility] Counselor (Signed)
 CSW has sent ACTT referrals to [facility] and [facility].  CSW received confirmation pages.  Sherryle Margo, MSW, LCSW 10/01/2023 3:36 PM

## 2023-10-01 NOTE — Group Note (Signed)
 [facility] LCSW Group Therapy Note   Group Date: 10/01/2023 Start Time: 1300 End Time: 1400   Type of Therapy/Topic:  Group Therapy:  Emotion Regulation  Participation Level:  Did Not Attend    Description of Group:    The purpose of this group is to assist patients in learning to regulate negative emotions and experience positive emotions. Patients will be guided to discuss ways in which they have been vulnerable to their negative emotions. These vulnerabilities will be juxtaposed with experiences of positive emotions or situations, and patients challenged to use positive emotions to combat negative ones. Special emphasis will be placed on coping with negative emotions in conflict situations, and patients will process healthy conflict resolution skills.  Therapeutic Goals: Patient will identify two positive emotions or experiences to reflect on in order to balance out negative emotions:  Patient will label two or more emotions that they find the most difficult to experience:  Patient will be able to demonstrate positive conflict resolution skills through discussion or role plays:   Summary of Patient Progress: X   Therapeutic Modalities:   Cognitive Behavioral Therapy Feelings Identification Dialectical Behavioral Therapy   Nadara JONELLE Fam, LCSW

## 2023-10-01 NOTE — Plan of Care (Signed)
  Problem: Education: Goal: Mental status will improve Outcome: Progressing Goal: Verbalization of understanding the information provided will improve Outcome: Progressing   Problem: Coping: Goal: Ability to verbalize frustrations and anger appropriately will improve Outcome: Progressing   Problem: Education: Goal: Ability to state activities that reduce stress will improve Outcome: Progressing

## 2023-10-01 NOTE — Progress Notes (Addendum)
 Pt have been isolative t his room throughout the day, he denied having thoughts plan or intention to harm self and without AVH.   10/01/23 0900  Psych Admission Type (Psych Patients Only)  Admission Status Voluntary  Psychosocial Assessment  Patient Complaints None  Eye Contact Fair  Facial Expression Animated  Affect Flat  Speech Soft  Interaction Minimal  Motor Activity Slow  Appearance/Hygiene Unremarkable  Behavior Characteristics Cooperative  Mood Pleasant  Thought Process  Coherency WDL  Content WDL  Delusions None reported or observed  Perception WDL  Hallucination None reported or observed  Judgment Impaired  Confusion WDL  Danger to Self  Current suicidal ideation? Denies  Agreement Not to Harm Self Yes  Description of Agreement verbal  Danger to Others  Danger to Others None reported or observed

## 2023-10-01 NOTE — Progress Notes (Signed)
 [facility] MD Progress Note  10/01/2023 2:04 PM Ethan Sutton  MRN:  [id number]  Patient is a 38 year old African American male with a past psychiatric history schizoaffective disorder and amphetamine abuse who is admitted following presentation to the emergency department requesting medication adjustment. He reports that his current regimen is no longer effective, stating, "My medicine wasn't working."  He is inconsistent reports noting that he stopped his medication following his recent discharge.  He had a recent admission at this facility for similar from 8/15-8/19.  To the ED provider he reported his depression symptoms returned after stopping the medication and that he had thoughts of suicide and auditory hallucinations telling him to harm himself.  Today on interview he is denying all of that he notes that he had vague auditory hallucinations.  He denies thoughts of harming himself and indicates he does not want to restart on an antidepressant medication.  He states that he was thinking about getting heroin to help him sleep because he has been unable to sleep since discontinuing his medications.  He denied homicidal ideation.  He denied substance use.  BAL was negative UDS was positive for benzodiazepines.  He indicates he did not go to his follow-up appointment with [facility].   Subjective:  Chart reviewed, case discussed in multidisciplinary meeting, patient seen during rounds.   Patient seen for follow-up evaluation. He reports improved sleep and states he is ready for discharge. He denies depression, anxiety, suicidal or homicidal ideation, auditory or visual hallucinations, paranoia, or delusions. He is eating adequately and states, "I think they are where they need to be," in reference to current medications and dosing.  09/30/23: Patient seen today in follow-up for psychiatric evaluation and medication management. He continues to report poor sleep contributing to suicidal thoughts, though he denies  SI/HI today. He denies hallucinations, paranoia, or delusions. On further review, patient denies historical symptoms of depression. He denies any psychotic symptoms while incarcerated despite not being prescribed any psychotropics at that time. Prozac  has been discontinued in light of these findings. He reports alcohol use at night to assist with sleep, stating previously that "beer helps me sleep at night." He was released from prison one month ago and is adjusting to community re-entry. Patient rates depression 5/10 and anxiety 3/10. Treatment plan remains to move Zyprexa  dosing to bedtime to improve sleep benefit, address pt report of hallucinations on admission and avoid daytime sedation. PRN trazodone  available for insomnia. Patient denies medication side effects. There are no abnormal movements or other symptoms of concern noted.   Sleep: good  Appetite:  good  Past Psychiatric History: see h&P Family History: History reviewed. No pertinent family history. Social History:  Social History   Substance and Sexual Activity  Alcohol Use Not Currently   Comment: ive been in prison for a year i haven't used any     Social History   Substance and Sexual Activity  Drug Use Not Currently    Social History   Socioeconomic History   Marital status: Single    Spouse name: Not on file   Number of children: Not on file   Years of education: Not on file   Highest education level: Not on file  Occupational History   Not on file  Tobacco Use   Smoking status: Some Days    Current packs/day: 0.00    Types: Cigarettes    Last attempt to quit: 2019    Years since quitting: 6.6   Smokeless tobacco: Never  Tobacco comments:    Pt declined nicotine  replacement 09/12/23  Vaping Use   Vaping status: Never Used  Substance and Sexual Activity   Alcohol use: Not Currently    Comment: ive been in prison for a year i haven't used any   Drug use: Not Currently   Sexual activity: Not Currently     Birth control/protection: Condom, Abstinence  Other Topics Concern   Not on file  Social History Narrative   Not on file   Social Drivers of Health   Financial Resource Strain: Not on file  Food Insecurity: Patient Declined (09/27/2023)   Hunger Vital Sign    Worried About Running Out of Food in the Last Year: Patient declined    Ran Out of Food in the Last Year: Patient declined  Transportation Needs: Patient Declined (09/27/2023)   PRAPARE - Administrator, Civil Service (Medical): Patient declined    Lack of Transportation (Non-Medical): Patient declined  Physical Activity: Not on file  Stress: Not on file  Social Connections: Unknown (09/27/2023)   Social Connection and Isolation Panel    Frequency of Communication with Friends and Family: More than three times a week    Frequency of Social Gatherings with Friends and Family: More than three times a week    Attends Religious Services: Never    Database administrator or Organizations: Patient declined    Attends Banker Meetings: Patient declined    Marital Status: Never married   Past Medical History:  Past Medical History:  Diagnosis Date   Depression 02/09/2023   Schizophrenia (HCC)     Past Surgical History:  Procedure Laterality Date   NO PAST SURGERIES      Current Medications: Current Facility-Administered Medications  Medication Dose Route Frequency Provider Last Rate Last Admin   acetaminophen  (TYLENOL ) tablet 650 mg  650 mg Oral Q6H PRN Donnelly Mellow, MD       alum & mag hydroxide-simeth (MAALOX/MYLANTA) 200-200-20 MG/5ML suspension 30 mL  30 mL Oral Q4H PRN Donnelly Mellow, MD       haloperidol  (HALDOL ) tablet 5 mg  5 mg Oral TID PRN Jadapalle, Sree, MD       And   diphenhydrAMINE  (BENADRYL ) capsule 50 mg  50 mg Oral TID PRN Jadapalle, Sree, MD       haloperidol  lactate (HALDOL ) injection 5 mg  5 mg Intramuscular TID PRN Jadapalle, Sree, MD       And   diphenhydrAMINE  (BENADRYL )  injection 50 mg  50 mg Intramuscular TID PRN Jadapalle, Sree, MD       And   LORazepam  (ATIVAN ) injection 2 mg  2 mg Intramuscular TID PRN Jadapalle, Sree, MD       haloperidol  lactate (HALDOL ) injection 10 mg  10 mg Intramuscular TID PRN Donnelly Mellow, MD       And   diphenhydrAMINE  (BENADRYL ) injection 50 mg  50 mg Intramuscular TID PRN Jadapalle, Sree, MD       And   LORazepam  (ATIVAN ) injection 2 mg  2 mg Intramuscular TID PRN Jadapalle, Sree, MD       hydrOXYzine  (ATARAX ) tablet 25 mg  25 mg Oral Q6H PRN Jadapalle, Sree, MD   25 mg at 09/29/23 2038   magnesium  hydroxide (MILK OF MAGNESIA) suspension 30 mL  30 mL Oral Daily PRN Donnelly Mellow, MD       OLANZapine  (ZYPREXA ) tablet 20 mg  20 mg Oral QHS Cleotilde Hoy HERO, NP   20  mg at 09/30/23 2102   traZODone  (DESYREL ) tablet 100 mg  100 mg Oral QHS PRN Debbie Donnice BRAVO, PA-C   100 mg at 09/30/23 2102    Lab Results:  Results for orders placed or performed during the hospital encounter of 09/27/23 (from the past 48 hours)  Hemoglobin A1c     Status: None   Collection Time: 09/30/23  6:18 AM  Result Value Ref Range   Hgb A1c MFr Bld 5.4 4.8 - 5.6 %    Comment: (NOTE)         Prediabetes: 5.7 - 6.4         Diabetes: >6.4         Glycemic control for adults with diabetes: <7.0    Mean Plasma Glucose 108 mg/dL    Comment: (NOTE) Performed At: [facility] [address] [place], [place] [id number] Jennette Shorter MD Ey:[id number]   Lipid panel     Status: Abnormal   Collection Time: 09/30/23  6:18 AM  Result Value Ref Range   Cholesterol 110 0 - 200 mg/dL   Triglycerides 43 <849 mg/dL   HDL 40 (L) >59 mg/dL   Total CHOL/HDL Ratio 2.8 RATIO   VLDL 9 0 - 40 mg/dL   LDL Cholesterol 61 0 - 99 mg/dL    Comment:        Total Cholesterol/HDL:CHD Risk Coronary Heart Disease Risk Table                     Men   Women  1/2 Average Risk   3.4   3.3  Average Risk       5.0   4.4  2 X Average Risk   9.6   7.1  3 X  Average Risk  23.4   11.0        Use the calculated Patient Ratio above and the CHD Risk Table to determine the patient's CHD Risk.        ATP III CLASSIFICATION (LDL):  <100     mg/dL   Optimal  899-870  mg/dL   Near or Above                    Optimal  130-159  mg/dL   Borderline  839-810  mg/dL   High  >809     mg/dL   Very High Performed at [facility], [address] [place], [place] [address]     Blood Alcohol level:  Lab Results  Component Value Date   [facility] <15 09/26/2023   [facility] <15 09/12/2023    Metabolic Disorder Labs: Lab Results  Component Value Date   HGBA1C 5.4 09/30/2023   MPG 108 09/30/2023   No results found for: PROLACTIN Lab Results  Component Value Date   CHOL 110 09/30/2023   TRIG 43 09/30/2023   HDL 40 (L) 09/30/2023   CHOLHDL 2.8 09/30/2023   VLDL 9 09/30/2023   LDLCALC 61 09/30/2023       Psychiatric Specialty Exam: Appearance: Casually dressed, fair hygiene. Behavior: Cooperative, good eye contact. Speech: Clear, normal rate, rhythm, and volume. Mood: Relaxed. Affect: Congruent. Thought Process: Linear, goal-directed. Thought Content: Denies hallucinations, paranoia, or delusions. Denies SI/HI today. Perceptions: No AVH reported. Insight: Fair. Judgment: Fair. Cognition: Fully oriented; memory intact. Concentration/Attention/Recall/Fund of Knowledge/Language: Intact. Psychomotor: No agitation, retardation, or excitability. Impulse Control: Good.  Musculoskeletal: Strength & Muscle Tone: within normal limits Gait & Station: normal Assets  Assets: Manufacturing systems engineer; Desire  for Improvement; Housing    Physical Exam: Physical Exam ROS Blood pressure 112/77, pulse 87, temperature (!) 97.1 F (36.2 C), resp. rate 18, height 6' (1.829 m), weight 89.8 kg, SpO2 99%. Body mass index is 26.85 kg/m.  Diagnosis: Principal Problem:   Schizoaffective disorder (HCC)   PLAN: Safety and Monitoring:  -- Voluntary  admission to inpatient psychiatric unit for safety, stabilization and treatment  -- Daily contact with patient to assess and evaluate symptoms and progress in treatment  -- Patient's case to be discussed in multi-disciplinary team meeting  -- Observation Level : q15 minute checks  -- Vital signs:  q12 hours  -- Precautions: suicide, elopement, and assault -- Encouraged patient to participate in unit milieu and in scheduled group therapies  2. Psychiatric Diagnoses and Treatment:   38 year old male with recent incarceration presenting primarily with insomnia and associated passive suicidal thoughts, though he denies SI/HI today. He denies historical depressive symptoms, and Prozac  has been discontinued. Diagnostic clarity remains under review -- concern for schizoaffective disorder or other psychotic disorder persists, but no diagnostic change at this time. Patient does not appear overtly psychotic. Continued inpatient care remains indicated for diagnostic clarification, sleep stabilization, and monitoring of safety. No appearing depressed, manic or with any unsafe or inappropriate behaviors.  No self-harm, aggressive, or unsafe behaviors noted.   Zyprexa  20mg  po at bedtime Continue current medication regimen. Plan for discharge tomorrow or Friday, pending safe disposition per social work. Encourage continued engagement in care and discharge planning.    3. Medical Issues Being Addressed: No acute concerns     4. Discharge Planning:   -- Social work and case management to assist with discharge planning and identification of hospital follow-up needs prior to discharge  -- Estimated LOS: 3-4 days  Hoy CHRISTELLA Pinal, NP 10/01/2023, 2:04 PM

## 2023-10-01 NOTE — Plan of Care (Signed)
   Problem: Education: Goal: Knowledge of Ethan Sutton General Education information/materials will improve Outcome: Progressing Goal: Emotional status will improve Outcome: Progressing Goal: Mental status will improve Outcome: Progressing Goal: Verbalization of understanding the information provided will improve Outcome: Progressing   Problem: Activity: Goal: Interest or engagement in activities will improve Outcome: Progressing Goal: Sleeping patterns will improve Outcome: Progressing   Problem: Coping: Goal: Ability to verbalize frustrations and anger appropriately will improve Outcome: Progressing Goal: Ability to demonstrate self-control will improve Outcome: Progressing

## 2023-10-01 NOTE — Progress Notes (Signed)
 Pt calm and pleasant during assessment denying SI/HI/AVH. Pt observed by this Clinical research associate interacting appropriately with staff and peers on the unit. Pt compliant with medication administration per MD orders. Pt given education, support, and encouragement to be active in his treatment plan. Pt being monitored Q 15 minutes for safety per unit protocol, remains safe on the unit

## 2023-10-01 NOTE — Group Note (Signed)
 Date:  10/01/2023 Time:  9:26 PM  Group Topic/Focus:  Wrap-Up Group:   The focus of this group is to help patients review their daily goal of treatment and discuss progress on daily workbooks.    Participation Level:  Active  Participation Quality:  Appropriate and Attentive  Affect:  Appropriate  Cognitive:  Appropriate  Insight: Appropriate  Engagement in Group:  Engaged  Modes of Intervention:  Activity and Discussion  Additional Comments:     Arlester CHRISTELLA Servant 10/01/2023, 9:26 PM

## 2023-10-01 NOTE — Group Note (Signed)
 Date:  10/01/2023 Time:  5:43 PM  Group Topic/Focus:  Overcoming Stress:   The focus of this group is to define stress and help patients assess their triggers.    Participation Level:  Did Not Attend   Ethan Sutton 10/01/2023, 5:43 PM

## 2023-10-02 NOTE — Group Note (Signed)
 Date:  10/02/2023 Time:  8:38 PM  Group Topic/Focus:  Wrap-Up Group:   The focus of this group is to help patients review how their day was and discussing what their goal for tomorrow is.    Participation Level:  Active  Participation Quality:  Appropriate  Affect:  Appropriate  Cognitive:  Appropriate  Insight: Appropriate  Engagement in Group:  Engaged  Modes of Intervention:  Activity and Discussion  Additional Comments:    Ethan Sutton 10/02/2023, 8:38 PM

## 2023-10-02 NOTE — Progress Notes (Addendum)
 Pt continue to isolate to his room throughout the day, he declined to attend groups, only OOR for meals.Pt he denied having thoughts plan or intention to harm self and without AVH.SABRA     10/02/23 1000  Psych Admission Type (Psych Patients Only)  Admission Status Voluntary  Psychosocial Assessment  Patient Complaints None  Eye Contact Fair  Facial Expression Animated  Affect Flat  Speech Soft  Interaction Minimal  Motor Activity Slow  Appearance/Hygiene Unremarkable  Behavior Characteristics Unwilling to participate  Mood Pleasant  Thought Process  Coherency WDL  Content WDL  Delusions None reported or observed  Perception WDL  Hallucination None reported or observed  Judgment Poor  Confusion WDL  Danger to Self  Current suicidal ideation? Denies  Agreement Not to Harm Self Yes  Description of Agreement verbal (verbal)  Danger to Others  Danger to Others None reported or observed

## 2023-10-02 NOTE — Progress Notes (Signed)
 [facility] MD Progress Note  10/02/2023 9:55 AM Ethan Sutton  MRN:  [id number]  Patient is a 38 year old African American male with a past psychiatric history schizoaffective disorder and amphetamine abuse who is admitted following presentation to the emergency department requesting medication adjustment. He reports that his current regimen is no longer effective, stating, "My medicine wasn't working."  He is inconsistent reports noting that he stopped his medication following his recent discharge.  He had a recent admission at this facility for similar from 8/15-8/19.  To the ED provider he reported his depression symptoms returned after stopping the medication and that he had thoughts of suicide and auditory hallucinations telling him to harm himself.  Today on interview he is denying all of that he notes that he had vague auditory hallucinations.  He denies thoughts of harming himself and indicates he does not want to restart on an antidepressant medication.  He states that he was thinking about getting heroin to help him sleep because he has been unable to sleep since discontinuing his medications.  He denied homicidal ideation.  He denied substance use.  BAL was negative UDS was positive for benzodiazepines.  He indicates he did not go to his follow-up appointment with [facility].   Subjective:  Chart reviewed, case discussed in multidisciplinary meeting, patient seen during rounds.   Patient was seen in follow-up for psychiatric evaluation. He reports, "meds are good." He states he is resting well, with sleep and appetite stable. Depression is reported at 2/10, and he denies anxiety. He denies suicidal ideation, homicidal ideation, hallucinations, paranoia, or delusions. No unsafe behaviors are observed on the unit. He remains pleasant, cooperative, and engaged. Discharge is planned for tomorrow pending final confirmation. Patient with history of depression remains stable on current medication regimen,  tolerating treatment well. No psychiatric or behavioral concerns today. Symptoms have improved, and he appears ready for transition to outpatient care.   10/01/23: Patient seen for follow-up evaluation. He reports improved sleep and states he is ready for discharge. He denies depression, anxiety, suicidal or homicidal ideation, auditory or visual hallucinations, paranoia, or delusions. He is eating adequately and states, "I think they are where they need to be," in reference to current medications and dosing.  09/30/23: Patient seen today in follow-up for psychiatric evaluation and medication management. He continues to report poor sleep contributing to suicidal thoughts, though he denies SI/HI today. He denies hallucinations, paranoia, or delusions. On further review, patient denies historical symptoms of depression. He denies any psychotic symptoms while incarcerated despite not being prescribed any psychotropics at that time. Prozac  has been discontinued in light of these findings. He reports alcohol use at night to assist with sleep, stating previously that "beer helps me sleep at night." He was released from prison one month ago and is adjusting to community re-entry. Patient rates depression 5/10 and anxiety 3/10. Treatment plan remains to move Zyprexa  dosing to bedtime to improve sleep benefit, address pt report of hallucinations on admission and avoid daytime sedation. PRN trazodone  available for insomnia. Patient denies medication side effects. There are no abnormal movements or other symptoms of concern noted.   Sleep: good  Appetite:  good  Past Psychiatric History: see h&P Family History: History reviewed. No pertinent family history. Social History:  Social History   Substance and Sexual Activity  Alcohol Use Not Currently   Comment: ive been in prison for a year i haven't used any     Social History   Substance and Sexual Activity  Drug Use Not Currently    Social History    Socioeconomic History   Marital status: Single    Spouse name: Not on file   Number of children: Not on file   Years of education: Not on file   Highest education level: Not on file  Occupational History   Not on file  Tobacco Use   Smoking status: Some Days    Current packs/day: 0.00    Types: Cigarettes    Last attempt to quit: 2019    Years since quitting: 6.6   Smokeless tobacco: Never   Tobacco comments:    Pt declined nicotine  replacement 09/12/23  Vaping Use   Vaping status: Never Used  Substance and Sexual Activity   Alcohol use: Not Currently    Comment: ive been in prison for a year i haven't used any   Drug use: Not Currently   Sexual activity: Not Currently    Birth control/protection: Condom, Abstinence  Other Topics Concern   Not on file  Social History Narrative   Not on file   Social Drivers of Health   Financial Resource Strain: Not on file  Food Insecurity: Patient Declined (09/27/2023)   Hunger Vital Sign    Worried About Running Out of Food in the Last Year: Patient declined    Ran Out of Food in the Last Year: Patient declined  Transportation Needs: Patient Declined (09/27/2023)   PRAPARE - Administrator, Civil Service (Medical): Patient declined    Lack of Transportation (Non-Medical): Patient declined  Physical Activity: Not on file  Stress: Not on file  Social Connections: Unknown (09/27/2023)   Social Connection and Isolation Panel    Frequency of Communication with Friends and Family: More than three times a week    Frequency of Social Gatherings with Friends and Family: More than three times a week    Attends Religious Services: Never    Database administrator or Organizations: Patient declined    Attends Banker Meetings: Patient declined    Marital Status: Never married   Past Medical History:  Past Medical History:  Diagnosis Date   Depression 02/09/2023   Schizophrenia (HCC)     Past Surgical History:   Procedure Laterality Date   NO PAST SURGERIES      Current Medications: Current Facility-Administered Medications  Medication Dose Route Frequency Provider Last Rate Last Admin   acetaminophen  (TYLENOL ) tablet 650 mg  650 mg Oral Q6H PRN Donnelly Mellow, MD       alum & mag hydroxide-simeth (MAALOX/MYLANTA) 200-200-20 MG/5ML suspension 30 mL  30 mL Oral Q4H PRN Donnelly Mellow, MD       haloperidol  (HALDOL ) tablet 5 mg  5 mg Oral TID PRN Jadapalle, Sree, MD       And   diphenhydrAMINE  (BENADRYL ) capsule 50 mg  50 mg Oral TID PRN Jadapalle, Sree, MD       haloperidol  lactate (HALDOL ) injection 5 mg  5 mg Intramuscular TID PRN Jadapalle, Sree, MD       And   diphenhydrAMINE  (BENADRYL ) injection 50 mg  50 mg Intramuscular TID PRN Jadapalle, Sree, MD       And   LORazepam  (ATIVAN ) injection 2 mg  2 mg Intramuscular TID PRN Jadapalle, Sree, MD       haloperidol  lactate (HALDOL ) injection 10 mg  10 mg Intramuscular TID PRN Donnelly Mellow, MD       And   diphenhydrAMINE  (BENADRYL ) injection 50 mg  50 mg Intramuscular TID PRN Jadapalle, Sree, MD       And   LORazepam  (ATIVAN ) injection 2 mg  2 mg Intramuscular TID PRN Jadapalle, Sree, MD       hydrOXYzine  (ATARAX ) tablet 25 mg  25 mg Oral Q6H PRN Jadapalle, Sree, MD   25 mg at 09/29/23 2038   magnesium  hydroxide (MILK OF MAGNESIA) suspension 30 mL  30 mL Oral Daily PRN Donnelly Mellow, MD       OLANZapine  (ZYPREXA ) tablet 20 mg  20 mg Oral QHS Cleotilde Hoy HERO, NP   20 mg at 10/01/23 2107   traZODone  (DESYREL ) tablet 100 mg  100 mg Oral QHS PRN Millington, Matthew E, PA-C   100 mg at 10/01/23 2107    Lab Results:  No results found for this or any previous visit (from the past 48 hours).   Blood Alcohol level:  Lab Results  Component Value Date   [facility] <15 09/26/2023   ETH <15 09/12/2023    Metabolic Disorder Labs: Lab Results  Component Value Date   HGBA1C 5.4 09/30/2023   MPG 108 09/30/2023   No results found for:  PROLACTIN Lab Results  Component Value Date   CHOL 110 09/30/2023   TRIG 43 09/30/2023   HDL 40 (L) 09/30/2023   CHOLHDL 2.8 09/30/2023   VLDL 9 09/30/2023   LDLCALC 61 09/30/2023       Psychiatric Specialty Exam: Appearance: Casually dressed, fair hygiene. Behavior: Cooperative, good eye contact. Speech: Clear, normal rate, rhythm, and volume. Mood: Relaxed. Affect: Congruent. Thought Process: Linear, goal-directed. Thought Content: Denies hallucinations, paranoia, or delusions. Denies SI/HI today. Perceptions: No AVH reported. Insight: Fair. Judgment: Fair. Cognition: Fully oriented; memory intact. Concentration/Attention/Recall/Fund of Knowledge/Language: Intact. Psychomotor: No agitation, retardation, or excitability. Impulse Control: Good.  Musculoskeletal: Strength & Muscle Tone: within normal limits Gait & Station: normal Assets  Assets: Manufacturing systems engineer; Desire for Improvement; Housing    Physical Exam: Physical Exam ROS Blood pressure 106/73, pulse 76, temperature (!) 97.3 F (36.3 C), resp. rate 16, height 6' (1.829 m), weight 89.8 kg, SpO2 98%. Body mass index is 26.85 kg/m.  Diagnosis: Principal Problem:   Schizoaffective disorder (HCC)   PLAN: Safety and Monitoring:  -- Voluntary admission to inpatient psychiatric unit for safety, stabilization and treatment  -- Daily contact with patient to assess and evaluate symptoms and progress in treatment  -- Patient's case to be discussed in multi-disciplinary team meeting  -- Observation Level : q15 minute checks  -- Vital signs:  q12 hours  -- Precautions: suicide, elopement, and assault -- Encouraged patient to participate in unit milieu and in scheduled group therapies  2. Psychiatric Diagnoses and Treatment:   38 year old male with recent incarceration presenting primarily with insomnia and associated passive suicidal thoughts, though he denies SI/HI today. He denies historical depressive  symptoms, and Prozac  has been discontinued. Diagnostic clarity remains under review -- concern for schizoaffective disorder or other psychotic disorder persists, but no diagnostic change at this time. Patient does not appear overtly psychotic. Continued inpatient care remains indicated for diagnostic clarification, sleep stabilization, and monitoring of safety. No appearing depressed, manic or with any unsafe or inappropriate behaviors.  No self-harm, aggressive, or unsafe behaviors noted.   Zyprexa  20mg  po at bedtime Continue current medication regimen. Plan for discharge tomorrow or Friday, pending safe disposition per social work. Encourage continued engagement in care and discharge planning.    3. Medical Issues Being Addressed: No acute concerns  4. Discharge Planning:   -- Social work and case management to assist with discharge planning and identification of hospital follow-up needs prior to discharge  -- Estimated LOS: 3-4 days  Hoy CHRISTELLA Pinal, NP 10/02/2023, 9:55 AM

## 2023-10-02 NOTE — Plan of Care (Signed)
   Problem: Education: Goal: Emotional status will improve Outcome: Progressing Goal: Mental status will improve Outcome: Progressing

## 2023-10-02 NOTE — Plan of Care (Signed)
  Problem: Education: Goal: Mental status will improve Outcome: Not Met (add Reason) Goal: Verbalization of understanding the information provided will improve Outcome: Progressing   Problem: Activity: Goal: Interest or engagement in activities will improve Outcome: Not Met (add Reason)   Problem: Safety: Goal: Periods of time without injury will increase Outcome: Progressing

## 2023-10-02 NOTE — Group Note (Signed)
 [facility] LCSW Group Therapy Note   Group Date: 10/02/2023 Start Time: 1300 End Time: 1400  Type of Therapy/Topic:  Group Therapy:  Feelings about Diagnosis  Participation Level:  Did Not Attend   Description of Group:    This group will allow patients to explore their thoughts and feelings about diagnoses they have received. Patients will be guided to explore their level of understanding and acceptance of these diagnoses. Facilitator will encourage patients to process their thoughts and feelings about the reactions of others to their diagnosis, and will guide patients in identifying ways to discuss their diagnosis with significant others in their lives. This group will be process-oriented, with patients participating in exploration of their own experiences as well as giving and receiving support and challenge from other group members.   Therapeutic Goals: 1. Patient will demonstrate understanding of diagnosis as evidence by identifying two or more symptoms of the disorder:  2. Patient will be able to express two feelings regarding the diagnosis 3. Patient will demonstrate ability to communicate their needs through discussion and/or role plays  Summary of Patient Progress: X  Therapeutic Modalities:   Cognitive Behavioral Therapy Brief Therapy Feelings Identification    Sherryle JINNY Margo, LCSW

## 2023-10-02 NOTE — Group Note (Signed)

## 2023-10-02 NOTE — Group Note (Signed)
 Date:  10/02/2023 Time:  10:16 AM  Group Topic/Focus:  Recovery Goals:   The focus of this group is to identify appropriate goals for recovery and establish a plan to achieve them.    Participation Level:  Did Not Attend   Ethan Sutton Derrall Hicks 10/02/2023, 10:16 AM

## 2023-10-02 NOTE — Progress Notes (Signed)
 Pt calm and pleasant during assessment denying SI/HI/AVH. Pt observed by this Clinical research associate interacting appropriately with staff and peers on the unit. Pt compliant with medication administration per MD orders. Pt given education, support, and encouragement to be active in his treatment plan. Pt being monitored Q 15 minutes for safety per unit protocol, remains safe on the unit

## 2023-10-03 MED ORDER — OLANZAPINE 20 MG PO TABS: 20.0000 mg | ORAL_TABLET | Freq: Every day | ORAL | 0 refills | Status: AC

## 2023-10-03 NOTE — Plan of Care (Signed)
  Problem: Education: Goal: Knowledge of [facility] Education information/materials will improve Outcome: Adequate for Discharge

## 2023-10-03 NOTE — Progress Notes (Signed)
 Discharge Note:  Patient denies SI/HI/AVH at this time. Discharge instructions, AVS, prescriptions, and transition record reviewed with patient. Patient agrees to comply with medication management, follow-up visit, and outpatient therapy. Patient belongings returned to patient. Patient questions and concerns addressed and answered. Patient ambulatory off unit. @11225 .  Patient discharged to home with with self care and transported by [employer] taxi

## 2023-10-03 NOTE — Progress Notes (Signed)
 [facility] Discharge Suicide Risk Assessment   Principal Problem: Schizoaffective disorder [facility]) Discharge Diagnoses: Principal Problem:   Schizoaffective disorder (HCC)   Total Time spent with patient: 45 minutes  Musculoskeletal: Strength & Muscle Tone: within normal limits Gait & Station: normal   Psychiatric Specialty Exam Appearance: Casually dressed, fair hygiene. Behavior: Cooperative, good eye contact. Speech: Clear, normal rate, rhythm, and volume. Mood: Relaxed. Affect: Congruent. Thought Process: Linear, goal-directed. Thought Content: Denies hallucinations, paranoia, or delusions. Denies SI/HI today. Perceptions: No AVH reported. Insight: Fair. Judgment: Fair. Cognition: Fully oriented; memory intact. Concentration/Attention/Recall/Fund of Knowledge/Language: Intact. Psychomotor: No agitation, retardation, or excitability. Impulse Control: Good.   Musculoskeletal: Strength & Muscle Tone: within normal limits Gait & Station: normal Assets  Assets: Manufacturing systems engineer; Desire for Improvement; Housing     Sleep  Sleep:No data recorded Estimated Sleeping Duration (Last 24 Hours): 10.25-12.00 hours  Physical Exam: Physical Exam ROS Blood pressure 113/79, pulse 65, temperature 98 F (36.7 C), resp. rate 18, height 6' (1.829 m), weight 89.8 kg, SpO2 99%. Body mass index is 26.85 kg/m.  Mental Status Per Nursing Assessment::   On Admission:  NA  Demographic Factors:  Male and Low socioeconomic status  Loss Factors: Decrease in vocational status and Legal issues  Historical Factors: NA  Risk Reduction Factors:   Living with another person, especially a relative, Positive social support, Positive therapeutic relationship, and Positive coping skills or problem solving skills  Continued Clinical Symptoms:  Alcohol/Substance Abuse/Dependencies  Cognitive Features That Contribute To Risk:  None    Suicide Risk:  Minimal: No identifiable suicidal  ideation.  Patients presenting with no risk factors but with morbid ruminations; may be classified as minimal risk based on the severity of the depressive symptoms   Follow-up Information     [name]  Follow up.   Why: In person assessment for therapy and medication management is scheduled for 10/08/2023 at 11AM. Contact information: [address] [place] [address] [phone]         [facility] Follow up.   Why: Referral was made. Please follow up. Contact information: [address] [place] [address] [phone]         [facility] Follow up.   Why: Referral was made.  Please follow up. Contact information: [address] [place] [address] [phone]                 Hoy CHRISTELLA Pinal, NP 10/03/2023, 10:00 AM

## 2023-10-03 NOTE — Progress Notes (Signed)
  [facility] Adult Case Management Discharge Plan :  Will you be returning to the same living situation after discharge:  Yes,  Patient to return home.  At discharge, do you have transportation home?: Yes,  CSW has arranged taxi services on patient's behalf.  Do you have the ability to pay for your medications: Yes,  [facility]  Release of information consent forms completed and in the chart;  Patient's signature needed at discharge.  Patient to Follow up at:  Follow-up Information     [name] [place] Follow up.   Why: In person assessment for therapy and medication management is scheduled for 10/08/2023 at 11AM. Contact information: [address] [place] [address] [phone]         [facility] Follow up.   Why: Referral was made. Please follow up. Contact information: [address] [place] [address] [phone]         [facility] Follow up.   Why: Referral was made.  Please follow up. Contact information: [address] [place] [address] [phone]                 Next level of care provider has access to [facility] Link:no  Safety Planning and Suicide Prevention discussed: Yes, SPE completed with pt, as pt refused to consent to family contact. SPI pamphlet provided to pt and pt was encouraged to share information with support network, ask questions, and talk about any concerns relating to SPE. Pt denies access to guns/firearms and verbalized understanding of information provided. Mobile Crisis information also provided to pt.      Has patient been referred to the Quitline?: Patient refused referral for treatment  Patient has been referred for addiction treatment: No known substance use disorder.  Melonee Gerstel M Natilie Krabbenhoft, LCSW 10/03/2023, 8:46 AM

## 2023-10-03 NOTE — Plan of Care (Signed)
   Problem: Education: Goal: Emotional status will improve Outcome: Progressing Goal: Mental status will improve Outcome: Progressing

## 2023-10-04 NOTE — Discharge Summary (Signed)
 Physician Discharge Summary Note  Patient:  Ethan Sutton is an 38 y.o., male MRN:  [id number] DOB:  May 14, 1985 Patient phone:  [phone] (home)  Patient address:   [address] [place] [place] [facility] [address],   Total time spent: 40 min Date of Admission:  09/27/2023 Date of Discharge: 10/03/23  Reason for Admission:  Patient is a 38 year old African American male with a past psychiatric history schizoaffective disorder and amphetamine abuse who is admitted following presentation to the emergency department requesting medication adjustment. He reports that his current regimen is no longer effective, stating, "My medicine wasn't working."  He is inconsistent reports noting that he stopped his medication following his recent discharge.  He had a recent admission at this facility for similar from 8/15-8/19.  To the ED provider he reported his depression symptoms returned after stopping the medication and that he had thoughts of suicide and auditory hallucinations telling him to harm himself.  Today on interview he is denying all of that he notes that he had vague auditory hallucinations.  He denies thoughts of harming himself and indicates he does not want to restart on an antidepressant medication.  He states that he was thinking about getting heroin to help him sleep because he has been unable to sleep since discontinuing his medications.  He denied homicidal ideation.  He denied substance use.  BAL was negative UDS was positive for benzodiazepines.  He indicates he did not go to his follow-up appointment with [facility].   Principal Problem: Schizoaffective disorder [facility]) Discharge Diagnoses: Principal Problem:   Schizoaffective disorder (HCC)  Past Psychiatric History: Information collected from pt and chart review   Prev Dx/Sx: bipolar disorder, schizophrenia, schizoaffective, anxiety, depression Current Psych Provider: none Home Meds (current): zyprexa   Previous Med Trials: risperidone , pt  uncertain  Therapy: none   Prior Psych Hospitalization: Multiple most recently 09/12/23 @ [facility] Prior Self Harm: Denies Prior Violence: Denies   Family Psych History: Siblings with schizophrenia and aggression Family Hx suicide: None reported   Social History:  Developmental Hx:  Educational Hx: 11 th Occupational Hx: Unemployed Armed forces operational officer Hx: Denies Living Situation: with mother and father Spiritual Hx: Denies Access to weapons/lethal means: none    Substance History Alcohol: Occasional History of alcohol withdrawal seizures denies History of DT's denies    Social History:  Social History   Substance and Sexual Activity  Alcohol Use Not Currently   Comment: ive been in prison for a year i haven't used any     Social History   Substance and Sexual Activity  Drug Use Not Currently    Social History   Socioeconomic History   Marital status: Single    Spouse name: Not on file   Number of children: Not on file   Years of education: Not on file   Highest education level: Not on file  Occupational History   Not on file  Tobacco Use   Smoking status: Some Days    Current packs/day: 0.00    Types: Cigarettes    Last attempt to quit: 2019    Years since quitting: 6.6   Smokeless tobacco: Never   Tobacco comments:    Pt declined nicotine  replacement 09/12/23  Vaping Use   Vaping status: Never Used  Substance and Sexual Activity   Alcohol use: Not Currently    Comment: ive been in prison for a year i haven't used any   Drug use: Not Currently   Sexual activity: Not Currently    Birth control/protection: Condom,  Abstinence  Other Topics Concern   Not on file  Social History Narrative   Not on file   Social Drivers of Health   Financial Resource Strain: Not on file  Food Insecurity: Patient Declined (09/27/2023)   Hunger Vital Sign    Worried About Running Out of Food in the Last Year: Patient declined    Ran Out of Food in the Last Year: Patient declined   Transportation Needs: Patient Declined (09/27/2023)   PRAPARE - Administrator, Civil Service (Medical): Patient declined    Lack of Transportation (Non-Medical): Patient declined  Physical Activity: Not on file  Stress: Not on file  Social Connections: Unknown (09/27/2023)   Social Connection and Isolation Panel    Frequency of Communication with Friends and Family: More than three times a week    Frequency of Social Gatherings with Friends and Family: More than three times a week    Attends Religious Services: Never    Database administrator or Organizations: Patient declined    Attends Banker Meetings: Patient declined    Marital Status: Never married   Past Medical History:  Past Medical History:  Diagnosis Date   Depression 02/09/2023   Schizophrenia (HCC)     Past Surgical History:  Procedure Laterality Date   NO PAST SURGERIES     Family History: History reviewed. No pertinent family history.  Hospital Course:   38 year old male admitted following reports of poor sleep and suicidal thoughts in the context of insomnia and recent prison release. At the time of admission, he denied hallucinations, paranoia, or delusions. He reported using alcohol at night to aid sleep and described chronic sleep disturbance as his primary issue. Inpatient admission was indicated for stabilization, medication management, and safety monitoring.  On admission, patient reported poor sleep, depressed mood, and thoughts of suicide related to insomnia. He denied psychotic symptoms. Prozac  was discontinued due to lack of benefit. Zyprexa  was shifted to bedtime to maximize benefit for sleep, and trazodone  PRN was continued for insomnia.  Throughout admission, patient's mood and sleep steadily improved. He reported decreasing depression and anxiety ratings (down to depression 4/10, anxiety 3/10 near discharge). He consistently denied hallucinations, paranoia, delusions, and suicidal  or homicidal ideation.  Mental status exams demonstrated linear thought process, goal-directed responses, stable mood, and congruent affect. He engaged in treatment, attended groups, and interacted appropriately with staff. By the end of his stay, he was brighter, future-oriented, and reported readiness for discharge.  Social work confirmed a safe discharge plan.  Psychotropic Medications at Discharge: Olanzapine  (Zyprexa ) [final dose/frequency at discharge] PO QHS Trazodone  [dose] PO QHS PRN insomnia (Prozac  discontinued during admission)   Detailed risk assessment is complete based on clinical exam and individual risk factors and acute suicide risk is low and acute violence risk is low.     Currently, all modifiable risk of harm to self/harm to others have been addressed and patient is no longer appropriate for the acute inpatient setting and is able to continue treatment for mental health needs in the community with the supports as indicated below.  Patient is educated and verbalized understanding of discharge plan of care including medications, follow-up appointments, mental health resources and further crisis services in the community.  He is instructed to call 911 or present to the nearest emergency room should he experience any decompensation in mood, disturbance of bowel or return of suicidal/homicidal ideations.  Patient verbalizes understanding of this education and agrees to this plan  of care   Diagnosis: Principal Problem:   Schizoaffective disorder (HCC) per history presentation more consistent with substance induced mood and psychosis     Appearance: Casually dressed, fair hygiene. Behavior: Cooperative, good eye contact. Speech: Clear, normal rate, rhythm, and volume. Mood: Relaxed. Affect: Congruent. Thought Process: Linear, goal-directed. Thought Content: Denies hallucinations, paranoia, or delusions. Denies SI/HI today. Perceptions: No AVH reported. Insight:  Fair. Judgment: Fair. Cognition: Fully oriented; memory intact. Concentration/Attention/Recall/Fund of Knowledge/Language: Intact. Psychomotor: No agitation, retardation, or excitability. Impulse Control: Good.   Musculoskeletal: Strength & Muscle Tone: within normal limits Gait & Station: normal Assets  Assets: Manufacturing systems engineer; Desire for Improvement; Housing    Strength & Muscle Tone: within normal limits Gait & Station: normal Assets  Assets: Manufacturing systems engineer; Desire for Improvement; Housing   Sleep  Sleep:No data recorded   Physical Exam: Physical Exam ROS Blood pressure 113/79, pulse 65, temperature 98 F (36.7 C), resp. rate 18, height 6' (1.829 m), weight 89.8 kg, SpO2 99%. Body mass index is 26.85 kg/m.   Social History   Tobacco Use  Smoking Status Some Days   Current packs/day: 0.00   Types: Cigarettes   Last attempt to quit: 2019   Years since quitting: 6.6  Smokeless Tobacco Never  Tobacco Comments   Pt declined nicotine  replacement 09/12/23   Tobacco Cessation:  N/A, patient does not currently use tobacco products   Blood Alcohol level:  Lab Results  Component Value Date   [facility] <15 09/26/2023   ETH <15 09/12/2023    Metabolic Disorder Labs:  Lab Results  Component Value Date   HGBA1C 5.4 09/30/2023   MPG 108 09/30/2023   No results found for: PROLACTIN Lab Results  Component Value Date   CHOL 110 09/30/2023   TRIG 43 09/30/2023   HDL 40 (L) 09/30/2023   CHOLHDL 2.8 09/30/2023   VLDL 9 09/30/2023   LDLCALC 61 09/30/2023    See Psychiatric Specialty Exam and Suicide Risk Assessment completed by Attending Physician prior to discharge.  Discharge destination:  Home  Is patient on multiple antipsychotic therapies at discharge:  No   Has Patient had three or more failed trials of antipsychotic monotherapy by history:  No  Recommended Plan for Multiple Antipsychotic Therapies: NA  Discharge Instructions     Diet -  low sodium heart healthy   Complete by: As directed    Increase activity slowly   Complete by: As directed       Allergies as of 10/03/2023   No Known Allergies      Medication List     STOP taking these medications    FLUoxetine  10 MG capsule Commonly known as: PROZAC    melatonin 5 MG Tabs   traZODone  50 MG tablet Commonly known as: DESYREL        TAKE these medications      Indication  OLANZapine  20 MG tablet Commonly known as: ZYPREXA  Take 1 tablet (20 mg total) by mouth at bedtime. What changed:  medication strength how much to take when to take this  Indication: Psychotic Depressive Illness, psychosis, mood        Follow-up Information     [name] Follow up.   Why: In person assessment for therapy and medication management is scheduled for 10/08/2023 at 11AM. Contact information: [address] [place] [address] [phone]         [facility] Follow up.   Why: Referral was made.  Please follow up. Contact information: [address] [place] [address] [phone]         [facility] Follow up.   Why: Referral was made.  Please follow up. Contact information: [address] [place] [address] [phone]                     Signed: Hoy CHRISTELLA Pinal, NP 10/04/2023, 11:12 AM

## 2023-12-12 ENCOUNTER — Telehealth: Payer: Self-pay

## 2023-12-12 NOTE — Telephone Encounter (Signed)
 Copied from CRM #[id number]. Topic: Appointments - Appointment Scheduling >> Dec 12, 2023  5:09 PM Victoria B wrote: Ashely from [facility] called in scheduled appt but wants to know can he and his brother be scheduled on the same day for appt. Mrn for brother is , [phone]

## 2023-12-12 NOTE — Telephone Encounter (Signed)
 Scheduled with Jolene one on 3/05 and one on 3/06

## 2023-12-16 ENCOUNTER — Ambulatory Visit: Payer: Self-pay | Admitting: Pediatrics

## 2024-03-30 ENCOUNTER — Ambulatory Visit: Payer: Self-pay | Admitting: Nurse Practitioner

## 2024-04-02 ENCOUNTER — Ambulatory Visit: Payer: Self-pay | Admitting: Nurse Practitioner
